# Patient Record
Sex: Male | Born: 1945 | Race: Black or African American | Hispanic: No | Marital: Single | Smoking: Never smoker
Health system: Southern US, Community
[De-identification: ages and names within clinical notes are randomized; demographics above are authoritative.]

## PROBLEM LIST (undated history)

## (undated) DIAGNOSIS — Z89512 Acquired absence of left leg below knee: Secondary | ICD-10-CM

## (undated) DIAGNOSIS — I509 Heart failure, unspecified: Secondary | ICD-10-CM

## (undated) DIAGNOSIS — I2510 Atherosclerotic heart disease of native coronary artery without angina pectoris: Secondary | ICD-10-CM

## (undated) DIAGNOSIS — N289 Disorder of kidney and ureter, unspecified: Secondary | ICD-10-CM

## (undated) DIAGNOSIS — E119 Type 2 diabetes mellitus without complications: Secondary | ICD-10-CM

## (undated) DIAGNOSIS — Z89511 Acquired absence of right leg below knee: Secondary | ICD-10-CM

## (undated) HISTORY — PX: CARDIAC CATHETERIZATION: SHX172

## (undated) HISTORY — PX: BELOW KNEE LEG AMPUTATION: SUR23

---

## 2008-05-15 HISTORY — PX: CARDIAC DEFIBRILLATOR PLACEMENT: SHX171

## 2008-10-01 ENCOUNTER — Ambulatory Visit: Payer: Self-pay | Admitting: Internal Medicine

## 2008-10-02 ENCOUNTER — Encounter: Payer: Self-pay | Admitting: Cardiology

## 2008-10-02 ENCOUNTER — Inpatient Hospital Stay (HOSPITAL_COMMUNITY): Admission: EM | Admit: 2008-10-02 | Discharge: 2008-10-05 | Payer: Self-pay | Admitting: Emergency Medicine

## 2010-08-23 LAB — GLUCOSE, CAPILLARY
Glucose-Capillary: 101 mg/dL — ABNORMAL HIGH (ref 70–99)
Glucose-Capillary: 148 mg/dL — ABNORMAL HIGH (ref 70–99)
Glucose-Capillary: 64 mg/dL — ABNORMAL LOW (ref 70–99)
Glucose-Capillary: 94 mg/dL (ref 70–99)
Glucose-Capillary: 98 mg/dL (ref 70–99)

## 2010-08-23 LAB — RENAL FUNCTION PANEL
Albumin: 2.7 g/dL — ABNORMAL LOW (ref 3.5–5.2)
GFR calc Af Amer: 9 mL/min — ABNORMAL LOW (ref >60)
GFR calc non Af Amer: 8 mL/min — ABNORMAL LOW (ref >60)
Glucose, Bld: 77 mg/dL (ref 70–99)
Phosphorus: 5.8 mg/dL — ABNORMAL HIGH (ref 2.3–4.6)
Potassium: 3.7 mEq/L (ref 3.5–5.1)
Sodium: 141 mEq/L (ref 135–145)

## 2010-08-23 LAB — BASIC METABOLIC PANEL
BUN: 16 mg/dL (ref 6–23)
BUN: 40 mg/dL — ABNORMAL HIGH (ref 6–23)
CO2: 27 mEq/L (ref 19–32)
CO2: 31 mEq/L (ref 19–32)
CO2: 33 mEq/L — ABNORMAL HIGH (ref 19–32)
Calcium: 8.6 mg/dL (ref 8.4–10.5)
Calcium: 9 mg/dL (ref 8.4–10.5)
Calcium: 9.3 mg/dL (ref 8.4–10.5)
Calcium: 9.3 mg/dL (ref 8.4–10.5)
Chloride: 97 mEq/L (ref 96–112)
Creatinine, Ser: 5.01 mg/dL — ABNORMAL HIGH (ref 0.4–1.5)
Creatinine, Ser: 7.16 mg/dL — ABNORMAL HIGH (ref 0.4–1.5)
Creatinine, Ser: 8.6 mg/dL — ABNORMAL HIGH (ref 0.4–1.5)
GFR calc Af Amer: 14 mL/min — ABNORMAL LOW (ref >60)
GFR calc Af Amer: 8 mL/min — ABNORMAL LOW (ref >60)
GFR calc Af Amer: 9 mL/min — ABNORMAL LOW (ref >60)
GFR calc non Af Amer: 12 mL/min — ABNORMAL LOW (ref >60)
GFR calc non Af Amer: 8 mL/min — ABNORMAL LOW (ref >60)
GFR calc non Af Amer: 8 mL/min — ABNORMAL LOW (ref >60)
Glucose, Bld: 101 mg/dL — ABNORMAL HIGH (ref 70–99)
Glucose, Bld: 81 mg/dL (ref 70–99)
Glucose, Bld: 90 mg/dL (ref 70–99)
Potassium: 3.3 mEq/L — ABNORMAL LOW (ref 3.5–5.1)
Sodium: 142 mEq/L (ref 135–145)

## 2010-08-23 LAB — COMPREHENSIVE METABOLIC PANEL
ALT: 20 U/L (ref 0–53)
AST: 29 U/L (ref 0–37)
CO2: 29 mEq/L (ref 19–32)
Calcium: 9.1 mg/dL (ref 8.4–10.5)
GFR calc Af Amer: 10 mL/min — ABNORMAL LOW (ref >60)
Sodium: 136 mEq/L (ref 135–145)
Total Protein: 6.7 g/dL (ref 6.0–8.3)

## 2010-08-23 LAB — DIFFERENTIAL
Eosinophils Absolute: 0.1 10*3/uL (ref 0.0–0.7)
Eosinophils Relative: 2 % (ref 0–5)
Lymphs Abs: 0.7 10*3/uL (ref 0.7–4.0)
Monocytes Absolute: 0.6 10*3/uL (ref 0.1–1.0)
Monocytes Relative: 12 % (ref 3–12)

## 2010-08-23 LAB — CBC
Hemoglobin: 9.5 g/dL — ABNORMAL LOW (ref 13.0–17.0)
MCHC: 33.2 g/dL (ref 30.0–36.0)
MCHC: 33.8 g/dL (ref 30.0–36.0)
RBC: 3.2 MIL/uL — ABNORMAL LOW (ref 4.22–5.81)
RBC: 3.27 MIL/uL — ABNORMAL LOW (ref 4.22–5.81)
RDW: 17.5 % — ABNORMAL HIGH (ref 11.5–15.5)
RDW: 17.8 % — ABNORMAL HIGH (ref 11.5–15.5)
RDW: 18 % — ABNORMAL HIGH (ref 11.5–15.5)

## 2010-08-23 LAB — CARDIAC PANEL(CRET KIN+CKTOT+MB+TROPI)
CK, MB: 5.6 ng/mL — ABNORMAL HIGH (ref 0.3–4.0)
Relative Index: 1.6 (ref 0.0–2.5)
Total CK: 365 U/L — ABNORMAL HIGH (ref 7–232)
Troponin I: 0.1 ng/mL — ABNORMAL HIGH (ref 0.00–0.06)

## 2010-08-23 LAB — PROTIME-INR
INR: 1.1 (ref 0.00–1.49)
Prothrombin Time: 14.7 seconds (ref 11.6–15.2)

## 2010-08-23 LAB — CK TOTAL AND CKMB (NOT AT ARMC)
CK, MB: 4.8 ng/mL — ABNORMAL HIGH (ref 0.3–4.0)
Relative Index: 1.6 (ref 0.0–2.5)

## 2010-08-23 LAB — POCT CARDIAC MARKERS
CKMB, poc: 5.2 ng/mL (ref 1.0–8.0)
Myoglobin, poc: >500 ng/mL (ref 12–200)

## 2010-08-23 LAB — TROPONIN I: Troponin I: 0.1 ng/mL — ABNORMAL HIGH (ref 0.00–0.06)

## 2010-09-27 NOTE — Discharge Summary (Signed)
NAME:  Clayton [name], Clayton [name] NO.:  [id number]   MEDICAL RECORD NO.:  [id number]          PATIENT TYPE:  INP   LOCATION:  6732                         FACILITY:  [facility]   PHYSICIAN:  Thompson Grayer, MD       DATE OF BIRTH:  08-18-1945   DATE OF ADMISSION:  10/01/2008  DATE OF DISCHARGE:  10/05/2008                               DISCHARGE SUMMARY   Time for this dictation and explanation to the patient greater than 40  minutes.   FINAL DIAGNOSES:  1. Admitted with syncope and ICD therapies x3 for monomorphic      ventricular tachycardia.      a.     Intercurrent antitachycardia pacing times 11, May 20 through       Oct 02, 2008.  2. Concurrent acute on chronic congestive heart failure.      a.     Symptoms of lower extremity edema and shortness of breath       with paroxysmal nocturnal dyspnea, progressive over a 2-week       period.  3. Aggressive dialysis at [place].  4. The patient has ischemic cardiomyopathy.  Echocardiogram Oct 02, 2008, ejection fraction 35%.      a.     Akinesis mid/distal posterior wall and apex.      b.     History of myocardial infarctions in the past in 1998 and       1999.  5. The patient has a Medtronic CONSULTS-CRT-D 2-4TRK.   Medication adjustments at discharge as dictated below.   SECONDARY DIAGNOSES:  1. End-stage renal disease on hemodialysis, Monday, Wednesday, Friday.  2. Hypertension.  3. Insulin-dependent diabetes.  4. Gout.  5. History of peptic ulcer disease.   PROCEDURES:  1. A 2-D echocardiogram, Oct 02, 2008, ejection fraction 35%.      Akinesis of the mid and distal posterior wall and apex.  2. Interrogation of the Medtronic BiV-ICD, no changes made this      admission.  3. Aggressive diuresis at hemodialysis.   BRIEF HISTORY:  The patient is a 65 year old male.  He has a history of  ischemic cardiomyopathy.  He has end-stage renal disease.  He is on  hemodialysis  Monday, Wednesday, Friday.  He presents at [facility] with a history of syncope followed by ICD shocks.  Rhythm strips showed several episodes of antitachycardia pacing with at  least one accelerating to ventricular fibrillation which required  shocking.  The patient has not had chest pain though his been  progressively short of breath with increasing lower extremity edema and  paroxysmal nocturnal dyspnea for the last 1-2 weeks.  The patient states  that he is at his dry weight, no hemodialysis, has been stopped early  often during to hypotension.  This would be a good time to mention that  his cardioactive medications are  1. Toprol XL 200 mg daily.  2. Hydralazine 100 mg 3 times daily.  3. Isosorbide dinitrate 20 mg 3 times daily.  4. Norvasc 10 mg daily.   HOSPITAL COURSE:  The patient admitted to [facility] May 20  with ICD therapies which were preceded by syncope.  He actually had one  in the emergency room.  This was witnessed after antitachycardia pacing  did fail.  They were delivered at 35 joules.  The patient related that  he had been getting progressively short of breath in the last 2 weeks  with lower extremity edema.  He was taken to hemodialysis the same day  of admission and aggressively dialyzed prior to hemodialysis.  All of  his medications were held except for a beta-blocker which was dosed at  50 mg daily.  He has continued to have all of the cardioactive  medications except for Toprol, placed on hold.  In the state of  congestive failure, his blood pressure on admission was 147/89, as  hemodialysis became more effective even on 50 mg only of Toprol, his  blood pressure went into the one teens and finally into the area of  106/57 and 110/63 on the day of discharge, May 24.  As mentioned above,  he has had no further ICD therapies either antitachycardia pacing or  cardioversions.  He is ready for discharge on hospital day #5.    Medications at discharge have been simplified  1. Toprol-XL 50 mg daily.  This is down from Toprol XL 200 mg daily.  2. His Hydralazine 100 mg three times daily is on hold.  3. Isosorbide dinitrate 20 mg three times daily on hold.  4. Norvasc 10 mg daily on hold.  5. The patient continues sliding scale insulin as before this      admission.  6. Enteric-coated aspirin 325 mg daily.  7. PhosLo 667 mg 2 tablets three times daily with meals.  8. Pravastatin 40 mg daily at bedtime.  9. Lantus 40 units injected subcutaneously at bedtime.  10.Zetia 10 mg daily.  11.Fish oil 1 gram three times daily with meals.  12.Renovite new medication.  This is renal vitamin daily.  This was      supplied by the renal service here.  He is asked to follow up with      Dr. Claris [facility] in the next week.   Perhaps of interest to the [facility], the patient had a  bath of 2K duration of 4 hours, standard heparin, Epogen 1000 units at  hemodialysis three times a week, Hectorol 3.5 mcg three times a week,  DFR 100, DFR 800.   Lab studies this admission on May 22, complete blood count, white cells  5.3, hemoglobin 10.6, hematocrit 31.50, platelets of 137.  Serum  electrolytes on the day of discharge, May 24, sodium 133, potassium 3.7,  chloride 95,  carbonate 27, BUN is 53, creatinine 8.6, glucose 149, protime this  admission 14.7, INR is 1.1, alkaline phosphatase 55, SGOT 29, SGPT is  20, troponin I studies are 0.09 and 0.10 than 0.10 than 0.10.  TSH was  obtained this admission, it was 0.640.  The BNP on admission was 783.      [name], [place]      Thompson Grayer, MD  Electronically Signed    GM/MEDQ  D:  10/05/2008  T:  10/06/2008  Job:  [id number]   cc:   Nickola Major. Simel, M.D.

## 2010-09-27 NOTE — H&P (Signed)
NAME:  Clayton [name], Clayton [name] NO.:  [id number]   MEDICAL RECORD NO.:  [id number]          PATIENT TYPE:  EMS   LOCATION:  MAJO                         FACILITY:  [facility]   PHYSICIAN:  Johnney Ou, MD DATE OF BIRTH:  06-27-1945   DATE OF ADMISSION:  10/01/2008  DATE OF DISCHARGE:                              HISTORY & PHYSICAL   CARDIOLOGIST:  Dr. Concha Pyo with [facility].   PRIMARY CARE DOCTOR:  Dr. Donna Christen.   CHIEF COMPLAINT:  Syncope an implantable cardioverter-defibrillator  shocks.   HISTORY OF PRESENT ILLNESS:  This is a 65 year old African American  gentleman with a history of an ischemic cardiomyopathy, unknown ejection  fraction, status post ICD placement, end-stage renal disease on  hemodialysis here with syncope today followed by ICD shocks.  He states  that he has been progressively more short of breath over the past 1-2  weeks, noticing increased lower extremity edema, paroxysmal nocturnal  dyspnea and orthopnea.  He states that they have been trying to increase  his medications, though unable to take off much fluid at hemodialysis  secondary to low blood pressure.  Today, he was feeling quite weak and  experienced syncope followed by therapy delivered by his ICD.  He called  EMS and on arrival, they noticed several episodes of ventricular  tachycardia treated antitachycardia pacing.  There was one incidence of  acceleration to ventricular fibrillation requiring shock in the  ambulance.  They administered 150 mg of IV amiodarone and brought him to  [facility] emergency department.  He has since been relatively stable  with 1-2 episodes of ventricular tachycardia successfully treated with  antitachycardia pacing.  Otherwise, he states that he is his dry weight  which is 115 kg, though he has only been able to take off a liter per  hemodialysis session.  He denies chest pain, palpitations or previous  episodes of syncope.   PAST MEDICAL HISTORY:  1. Ischemic cardiomyopathy with an unknown ejection fraction, status      post Medtronic ICD.  2. End-stage renal disease on hemodialysis Monday, Wednesday and      Friday.  3. Hypertension.  4. Insulin-dependent diabetes.  5. Gout.  6. Peptic ulcer disease, status post corrective surgery.  7. GERD.   SOCIAL HISTORY:  Lives in this [place], [place] with his wife.  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  He has no history  of tobacco abuse.   FAMILY HISTORY:  Significant for early coronary disease in his father  who died of myocardial infarction at age 93.   REVIEW OF SYSTEMS:  All 14-systems were reviewed and were negative  except as mentioned in detail in the HPI.   ALLERGIES:  ZOCOR, IT GIVES HIM MYALGIAS.   MEDICATIONS ON ADMISSION:  1. Calcium 667 mg 2 tablets three times with meals.  2. Colchicine as needed.  3. Zetia 10 mg daily.  4. Fish oil daily.  5. Toprol XL 200 mg daily.  6. Norvasc 10 mg daily.  7. Hydralazine 100 mg three times daily.  8. Isordil 20 mg three  times daily.  9. Pravastatin 40 mg daily.   PHYSICAL EXAMINATION:  VITAL SIGNS:  His blood pressure is 137/78,  respiratory rate is 22, pulses is 85.  His temperature is afebrile.  Saturating 97% on 2 liters nasal cannula.  GENERAL:  He is a 65 year old African American male appearing  chronically ill in mild acute distress.  HEENT:  Moist mucous membranes.  Pupils are equal, round and reactive to light and accommodation.  Anicteric sclera.  NECK:  15 cm jugulovenous pulsations.  No thyromegaly.  CARDIOVASCULAR:  Regular rate and rhythm.  No murmurs, rubs or gallops.  LUNGS:  Bilateral crackles throughout with decreased lung sounds at  bases.  ABDOMEN:  Nontender, nondistended.  Positive bowel sounds.  No masses.  EXTREMITIES:  He has 2+ pitting edema to his knees.  He has got a patent  left arteriovenous fistula.  NEUROLOGIC:  Alert and oriented x3.  Cranial nerves II-XII  grossly intact.  No focal neurologic deficit.  SKIN:  Warm, dry and intact.  No rashes.  PSYCH:  Mood and affect are appropriate.   RADIOLOGY:  Chest x-ray is pending.  EKG showed normal sinus rhythm with  left bundle branch and no PVCs and no outright ST or T-wave changes  suggestive of ongoing ischemia.   LABORATORY DATA:  All laboratories are currently pending.   ASSESSMENT/PLAN:  This is a 65 year old African American male with an  ischemic cardiomyopathy, status post implantable cardioverter-  defibrillator here with 2 weeks of progressive signs of acute on chronic  systolic congestive heart failure, now complicated by ventricular  tachycardia requiring implantable cardioverter-defibrillator therapy.   1. Ventricular tachycardia.  His implantable cardioverter-      defibrillator therapy appears appropriate with ATP and shocks.      This ventricular tachycardia is monomorphic and likely represents      his ischemic cardiomyopathy in context of decompensated heart      failure rather than acute ischemia.  We will increase his      metoprolol to 200 mg of metoprolol tartrate twice daily.  He      received amiodarone 150 mg in the ambulance.  We will also obtain      cardiac markers, although they are likely elevated, at least      somewhat, due to his implantable cardioverter-defibrillator      therapies.  Then, we will consider empiric therapy with      anticoagulation until further information is obtained.  2. Congestive heart failure.  This is acute on chronic systolic heart      failure by physical examination.  We will      obtain his records from his [place].  He needs diuresis.  By the      patient, he only produces minimal urine on a daily basis and we      will contact renal for hemodialysis.  Consider a heart transplant      workup in this clinical context.  3. End-stage renal disease.  We will arrange hemodialysis with a renal      consult.      Johnney Ou, MD   Electronically Signed     BHH/MEDQ  D:  10/02/2008  T:  10/02/2008  Job:  [id number]

## 2010-09-27 NOTE — Consult Note (Signed)
NAME:  Clayton [name], Clayton [name] NO.:  [id number]   MEDICAL RECORD NO.:  [id number]          PATIENT TYPE:  INP   LOCATION:  [id number]                         FACILITY:  [facility]   PHYSICIAN:  Louis Meckel, M.D.DATE OF BIRTH:  Sep 20, 1945   DATE OF CONSULTATION:  10/02/2008  DATE OF DISCHARGE:                                 CONSULTATION   CARDIOLOGIST:  Dr. Manuela Schwartz B. Jamey Reas, [place].   NEPHROLOGIST:  [facility], phone number is [phone].  Dialysis Center is in [place].   HISTORY OF PRESENT ILLNESS:  This is 65 year old African American male  who was admitted because of AICD firings x3 this evening, initially  fired after a syncopal episode around 10 p.m.  He has end-stage renal  disease, on hemodialysis Monday, Wednesday and Friday.  His last  hemodialysis was May 19, at [facility]  He was found to be dyspneic in the  ED.  X-ray showed pulmonary congestion from CHF without any pleural  effusions.  He denies any chest pain at this time.  He had seen a  Cardiologist earlier in the day in [place], where they had adjusted his  pacemaker and AICD.  During dialysis this week, he states that he has  been limited to 2-3 L removed due to low blood pressures.  He does admit  to dyspnea for a week now with cough and sputum.  No fevers, no chills  associated, no nausea and no vomiting at this time and he is anuric he  tells me.   PAST MEDICAL HISTORY:  1. End-stage renal disease, on hemodialysis Monday, Wednesday, and      Friday due to diabetes.  2. Diabetes.  3. Hypertension.  4. Hyperlipidemia.  5. Anemia.  6. GERD.  7. Coronary artery disease, status post stents in 1998 and 1999.  8. Pacemaker and AICD placed.  9. CHF.   ALLERGIES:  VALSARTAN.   MEDICATIONS:  1. Calcium acetate 667 mg 2 tablets p.o. t.i.d. with meals.  2. Acetamide 10 mg p.o. daily.  3. Metoprolol succinate 200 mg p.o. daily.  4. Insulin aspart 5 units t.i.d. before meals.  5. Lantus 40  units subcu at bedtime.  6. Pravastatin 40 mg at bedtime.   HEMODIALYSIS PRESCRIPTION:  Again, he gets hemodialysis on Monday,  Wednesday, and Friday.  His estimated dry weight is 115 kg.  His length  of treatment is 4 hours.  He has access in the left AV fistula at the  wrist.   PHYSICAL EXAMINATION:  VITAL SIGNS:  Temperature is 98.1, pulse 87,  blood pressure 137/70, respiratory rate 21, currently 3 L he is sating  99%.  GENERAL:  He is alert and oriented.  He is in no apparent distress on 3  L.  HEENT:  Extraocular movements intact.  Pupils equal, round, reactive to  light and accommodation.  Moist mucous membranes.  NECK:  Supple with full range of motion.  CARDIOVASCULAR:  Irregular rate and rhythm.  He has 1/6 systolic  ejection murmur.  No rubs heard.  LUNGS:  He has crackles in the bases bilaterally.  ABDOMEN:  Soft,  nontender and nondistended.  EXTREMITIES:  Trace edema of the lower extremities.  He has a left AV  fistula at the wrist with significant palpable bruit.  NEUROLOGIC:  Alert and oriented x3.  Cranial nerves II through XII  grossly intact.  Normal strength.  Normal sensation.  No asterixis.   LABORATORY DATA:  Chest x-ray showed CHF with cardiomegaly, no  significant pleural effusions.  White blood cell count 4.7, hemoglobin  10.6, and platelets 141.  Sodium 136, potassium 3.1, chloride 98, bicarb  29, BUN 30, creatinine 6.96 and glucose 156.  Phos 4.4, mag 2.3, total  protein 6.7, albumin 3.1, and calcium 9.1.  Point-of-care enzymes have  been negative.  BNP is 783.   ASSESSMENT AND PLAN:  This is 65 year old male status post automatic  implantable cardioverter-defibrillator firings x3 with end-stage renal  disease, on hemodialysis currently on 3 L of oxygen for dyspnea.  1. Renal.  Again, he has end-stage renal disease, on hemodialysis on      Monday, Wednesday, and Friday.  He is not at hemodialysis emergency      at this time.  He is comfortable.   Electrolytes are stable and he      is not acidotic.  We will plan for a hemodialysis in the morning      after calling [facility] to obtain records.  It is estimated      that he may need hemodialysis today and tomorrow to pull enough      fluid off given his low-blood pressures.  2. Cardiovascular.  He is status post AICD firings, I will leave this      per Cardiology.  It is unclear the reason for these firings at this      time.  3. Bones.  We will continue him on calcium acetate.  4. Anemia.  He is not at goal.  He is less than 12.  We will find out      if he has received any epo, or I will provide this with      hemodialysis.  5. Diabetes.  Sliding scale insulin and Lantus.  6. Hypertension.  Hold his BP medications prior to hemodialysis today.  7. Fluids, electrolytes, nutrition.  Put him on a renal diet 70/2/2.  8. Disposition.  He is a full code.  Hemodialysis in the morning and      he is to obtain outside records.      Dion Body, MD  Electronically Signed     ______________________________  Louis Meckel, M.D.    KL/MEDQ  D:  10/02/2008  T:  10/02/2008  Job:  [id number]

## 2012-10-09 ENCOUNTER — Other Ambulatory Visit: Payer: Self-pay | Admitting: Family Medicine

## 2012-10-09 LAB — CBC WITH DIFFERENTIAL/PLATELET
Basophil #: 0.1 10*3/uL (ref 0.0–0.1)
Basophil %: 1.2 %
Eosinophil #: 0.1 10*3/uL (ref 0.0–0.7)
Eosinophil %: 0.7 %
HCT: 27 % — ABNORMAL LOW (ref 40.0–52.0)
HGB: 8.8 g/dL — ABNORMAL LOW (ref 13.0–18.0)
MCH: 31.8 pg (ref 26.0–34.0)
Monocyte #: 0.7 x10 3/mm (ref 0.2–1.0)
Monocyte %: 8 %
Neutrophil %: 83.5 %
RBC: 2.78 10*6/uL — ABNORMAL LOW (ref 4.40–5.90)
WBC: 9.1 10*3/uL (ref 3.8–10.6)

## 2012-10-09 LAB — COMPREHENSIVE METABOLIC PANEL
Albumin: 2 g/dL — ABNORMAL LOW (ref 3.4–5.0)
Bilirubin,Total: 1.6 mg/dL — ABNORMAL HIGH (ref 0.2–1.0)
Co2: 29 mmol/L (ref 21–32)
Creatinine: 4.4 mg/dL — ABNORMAL HIGH (ref 0.60–1.30)
EGFR (African American): 15 — ABNORMAL LOW
EGFR (Non-African Amer.): 13 — ABNORMAL LOW
Glucose: 84 mg/dL (ref 65–99)
Osmolality: 281 (ref 275–301)
Potassium: 4.6 mmol/L (ref 3.5–5.1)
SGOT(AST): 40 U/L — ABNORMAL HIGH (ref 15–37)
SGPT (ALT): 23 U/L (ref 12–78)
Total Protein: 6.7 g/dL (ref 6.4–8.2)

## 2012-11-26 ENCOUNTER — Emergency Department: Payer: Self-pay | Admitting: Emergency Medicine

## 2012-11-26 LAB — CBC
HGB: 9.4 g/dL — ABNORMAL LOW (ref 13.0–18.0)
MCHC: 32.3 g/dL (ref 32.0–36.0)
Platelet: 164 10*3/uL (ref 150–440)
RBC: 3 10*6/uL — ABNORMAL LOW (ref 4.40–5.90)
WBC: 5.6 10*3/uL (ref 3.8–10.6)

## 2012-11-26 LAB — COMPREHENSIVE METABOLIC PANEL
Albumin: 2.2 g/dL — ABNORMAL LOW (ref 3.4–5.0)
Alkaline Phosphatase: 228 U/L — ABNORMAL HIGH (ref 50–136)
Bilirubin,Total: 0.9 mg/dL (ref 0.2–1.0)
Calcium, Total: 9.4 mg/dL (ref 8.5–10.1)
Chloride: 100 mmol/L (ref 98–107)
EGFR (African American): 11 — ABNORMAL LOW
EGFR (Non-African Amer.): 9 — ABNORMAL LOW
Glucose: 90 mg/dL (ref 65–99)
Osmolality: 282 (ref 275–301)
Potassium: 3.5 mmol/L (ref 3.5–5.1)
SGOT(AST): 15 U/L (ref 15–37)
SGPT (ALT): 11 U/L — ABNORMAL LOW (ref 12–78)
Sodium: 137 mmol/L (ref 136–145)
Total Protein: 7.5 g/dL (ref 6.4–8.2)

## 2012-12-01 LAB — CULTURE, BLOOD (SINGLE)

## 2014-06-28 ENCOUNTER — Inpatient Hospital Stay (HOSPITAL_COMMUNITY)
Admission: EM | Admit: 2014-06-28 | Discharge: 2014-07-11 | DRG: 871 | Disposition: A | Discharge status: Other Acute Inpatient Hospital | Payer: Medicare Other | Attending: Internal Medicine | Admitting: Internal Medicine

## 2014-06-28 ENCOUNTER — Emergency Department (HOSPITAL_COMMUNITY): Payer: Medicare Other

## 2014-06-28 ENCOUNTER — Encounter (HOSPITAL_COMMUNITY): Payer: Self-pay | Admitting: Emergency Medicine

## 2014-06-28 DIAGNOSIS — I120 Hypertensive chronic kidney disease with stage 5 chronic kidney disease or end stage renal disease: Secondary | ICD-10-CM | POA: Diagnosis present

## 2014-06-28 DIAGNOSIS — E861 Hypovolemia: Secondary | ICD-10-CM | POA: Diagnosis present

## 2014-06-28 DIAGNOSIS — Z89512 Acquired absence of left leg below knee: Secondary | ICD-10-CM

## 2014-06-28 DIAGNOSIS — I214 Non-ST elevation (NSTEMI) myocardial infarction: Secondary | ICD-10-CM | POA: Diagnosis present

## 2014-06-28 DIAGNOSIS — E1165 Type 2 diabetes mellitus with hyperglycemia: Secondary | ICD-10-CM | POA: Diagnosis present

## 2014-06-28 DIAGNOSIS — Z515 Encounter for palliative care: Secondary | ICD-10-CM | POA: Insufficient documentation

## 2014-06-28 DIAGNOSIS — I272 Other secondary pulmonary hypertension: Secondary | ICD-10-CM | POA: Diagnosis present

## 2014-06-28 DIAGNOSIS — R55 Syncope and collapse: Secondary | ICD-10-CM

## 2014-06-28 DIAGNOSIS — G9340 Encephalopathy, unspecified: Secondary | ICD-10-CM | POA: Diagnosis present

## 2014-06-28 DIAGNOSIS — R140 Abdominal distension (gaseous): Secondary | ICD-10-CM | POA: Insufficient documentation

## 2014-06-28 DIAGNOSIS — I5023 Acute on chronic systolic (congestive) heart failure: Secondary | ICD-10-CM | POA: Diagnosis not present

## 2014-06-28 DIAGNOSIS — A047 Enterocolitis due to Clostridium difficile: Secondary | ICD-10-CM | POA: Diagnosis present

## 2014-06-28 DIAGNOSIS — D72819 Decreased white blood cell count, unspecified: Secondary | ICD-10-CM | POA: Diagnosis present

## 2014-06-28 DIAGNOSIS — E1159 Type 2 diabetes mellitus with other circulatory complications: Secondary | ICD-10-CM | POA: Diagnosis present

## 2014-06-28 DIAGNOSIS — Z79899 Other long term (current) drug therapy: Secondary | ICD-10-CM | POA: Diagnosis not present

## 2014-06-28 DIAGNOSIS — I739 Peripheral vascular disease, unspecified: Secondary | ICD-10-CM | POA: Diagnosis present

## 2014-06-28 DIAGNOSIS — D689 Coagulation defect, unspecified: Secondary | ICD-10-CM | POA: Diagnosis present

## 2014-06-28 DIAGNOSIS — I255 Ischemic cardiomyopathy: Secondary | ICD-10-CM | POA: Diagnosis present

## 2014-06-28 DIAGNOSIS — E1351 Other specified diabetes mellitus with diabetic peripheral angiopathy without gangrene: Secondary | ICD-10-CM | POA: Diagnosis present

## 2014-06-28 DIAGNOSIS — R570 Cardiogenic shock: Secondary | ICD-10-CM | POA: Diagnosis present

## 2014-06-28 DIAGNOSIS — I472 Ventricular tachycardia: Secondary | ICD-10-CM | POA: Insufficient documentation

## 2014-06-28 DIAGNOSIS — E1151 Type 2 diabetes mellitus with diabetic peripheral angiopathy without gangrene: Secondary | ICD-10-CM | POA: Diagnosis present

## 2014-06-28 DIAGNOSIS — E876 Hypokalemia: Secondary | ICD-10-CM | POA: Diagnosis present

## 2014-06-28 DIAGNOSIS — I501 Left ventricular failure: Secondary | ICD-10-CM

## 2014-06-28 DIAGNOSIS — I5033 Acute on chronic diastolic (congestive) heart failure: Secondary | ICD-10-CM | POA: Insufficient documentation

## 2014-06-28 DIAGNOSIS — E1129 Type 2 diabetes mellitus with other diabetic kidney complication: Secondary | ICD-10-CM

## 2014-06-28 DIAGNOSIS — R6521 Severe sepsis with septic shock: Secondary | ICD-10-CM | POA: Diagnosis present

## 2014-06-28 DIAGNOSIS — I2510 Atherosclerotic heart disease of native coronary artery without angina pectoris: Secondary | ICD-10-CM | POA: Diagnosis present

## 2014-06-28 DIAGNOSIS — N186 End stage renal disease: Secondary | ICD-10-CM | POA: Diagnosis present

## 2014-06-28 DIAGNOSIS — Z6825 Body mass index (BMI) 25.0-25.9, adult: Secondary | ICD-10-CM | POA: Diagnosis not present

## 2014-06-28 DIAGNOSIS — E1122 Type 2 diabetes mellitus with diabetic chronic kidney disease: Secondary | ICD-10-CM | POA: Diagnosis present

## 2014-06-28 DIAGNOSIS — Z89511 Acquired absence of right leg below knee: Secondary | ICD-10-CM

## 2014-06-28 DIAGNOSIS — IMO0002 Reserved for concepts with insufficient information to code with codable children: Secondary | ICD-10-CM | POA: Diagnosis present

## 2014-06-28 DIAGNOSIS — M898X9 Other specified disorders of bone, unspecified site: Secondary | ICD-10-CM | POA: Diagnosis present

## 2014-06-28 DIAGNOSIS — I5022 Chronic systolic (congestive) heart failure: Secondary | ICD-10-CM | POA: Diagnosis present

## 2014-06-28 DIAGNOSIS — A419 Sepsis, unspecified organism: Principal | ICD-10-CM | POA: Diagnosis present

## 2014-06-28 DIAGNOSIS — E119 Type 2 diabetes mellitus without complications: Secondary | ICD-10-CM | POA: Insufficient documentation

## 2014-06-28 DIAGNOSIS — I959 Hypotension, unspecified: Secondary | ICD-10-CM

## 2014-06-28 DIAGNOSIS — E440 Moderate protein-calorie malnutrition: Secondary | ICD-10-CM | POA: Diagnosis present

## 2014-06-28 DIAGNOSIS — D631 Anemia in chronic kidney disease: Secondary | ICD-10-CM | POA: Diagnosis present

## 2014-06-28 DIAGNOSIS — A0472 Enterocolitis due to Clostridium difficile, not specified as recurrent: Secondary | ICD-10-CM | POA: Insufficient documentation

## 2014-06-28 DIAGNOSIS — I4729 Other ventricular tachycardia: Secondary | ICD-10-CM | POA: Insufficient documentation

## 2014-06-28 DIAGNOSIS — Z95810 Presence of automatic (implantable) cardiac defibrillator: Secondary | ICD-10-CM

## 2014-06-28 DIAGNOSIS — Z452 Encounter for adjustment and management of vascular access device: Secondary | ICD-10-CM

## 2014-06-28 DIAGNOSIS — Z992 Dependence on renal dialysis: Secondary | ICD-10-CM

## 2014-06-28 HISTORY — DX: Heart failure, unspecified: I50.9

## 2014-06-28 HISTORY — DX: Acquired absence of left leg below knee: Z89.512

## 2014-06-28 HISTORY — DX: Disorder of kidney and ureter, unspecified: N28.9

## 2014-06-28 HISTORY — DX: Acquired absence of left leg below knee: Z89.511

## 2014-06-28 HISTORY — DX: Atherosclerotic heart disease of native coronary artery without angina pectoris: I25.10

## 2014-06-28 HISTORY — DX: Type 2 diabetes mellitus without complications: E11.9

## 2014-06-28 LAB — I-STAT CG4 LACTIC ACID, ED
Lactic Acid, Venous: 3.82 mmol/L (ref 0.5–2.0)
Lactic Acid, Venous: 4.61 mmol/L (ref 0.5–2.0)

## 2014-06-28 LAB — COMPREHENSIVE METABOLIC PANEL
ALT: 10 U/L (ref 0–53)
ANION GAP: 10 (ref 5–15)
AST: 31 U/L (ref 0–37)
Albumin: 2.9 g/dL — ABNORMAL LOW (ref 3.5–5.2)
Alkaline Phosphatase: 123 U/L — ABNORMAL HIGH (ref 39–117)
BUN: 18 mg/dL (ref 6–23)
CO2: 31 mmol/L (ref 19–32)
Calcium: 9.2 mg/dL (ref 8.4–10.5)
Chloride: 96 mmol/L (ref 96–112)
Creatinine, Ser: 6.17 mg/dL — ABNORMAL HIGH (ref 0.50–1.35)
GFR calc non Af Amer: 8 mL/min — ABNORMAL LOW (ref >90)
GFR, EST AFRICAN AMERICAN: 10 mL/min — AB (ref >90)
Glucose, Bld: 96 mg/dL (ref 70–99)
POTASSIUM: 2.6 mmol/L — AB (ref 3.5–5.1)
Sodium: 137 mmol/L (ref 135–145)
TOTAL PROTEIN: 7.5 g/dL (ref 6.0–8.3)
Total Bilirubin: 1.5 mg/dL — ABNORMAL HIGH (ref 0.3–1.2)

## 2014-06-28 LAB — CBC WITH DIFFERENTIAL/PLATELET
Basophils Absolute: 0 10*3/uL (ref 0.0–0.1)
Basophils Relative: 1 % (ref 0–1)
EOS PCT: 2 % (ref 0–5)
Eosinophils Absolute: 0.1 10*3/uL (ref 0.0–0.7)
HCT: 40.1 % (ref 39.0–52.0)
Hemoglobin: 12.7 g/dL — ABNORMAL LOW (ref 13.0–17.0)
LYMPHS ABS: 0.6 10*3/uL — AB (ref 0.7–4.0)
Lymphocytes Relative: 20 % (ref 12–46)
MCH: 28.4 pg (ref 26.0–34.0)
MCHC: 31.7 g/dL (ref 30.0–36.0)
MCV: 89.7 fL (ref 78.0–100.0)
MONOS PCT: 8 % (ref 3–12)
Monocytes Absolute: 0.3 10*3/uL (ref 0.1–1.0)
Neutro Abs: 2.2 10*3/uL (ref 1.7–7.7)
Neutrophils Relative %: 68 % (ref 43–77)
Platelets: 79 10*3/uL — ABNORMAL LOW (ref 150–400)
RBC: 4.47 MIL/uL (ref 4.22–5.81)
RDW: 21.1 % — AB (ref 11.5–15.5)
WBC: 3.2 10*3/uL — ABNORMAL LOW (ref 4.0–10.5)

## 2014-06-28 LAB — I-STAT CHEM 8, ED
BUN: 21 mg/dL (ref 6–23)
CALCIUM ION: 1.09 mmol/L — AB (ref 1.13–1.30)
Chloride: 96 mmol/L (ref 96–112)
Creatinine, Ser: 5.5 mg/dL — ABNORMAL HIGH (ref 0.50–1.35)
GLUCOSE: 93 mg/dL (ref 70–99)
HCT: 46 % (ref 39.0–52.0)
Hemoglobin: 15.6 g/dL (ref 13.0–17.0)
POTASSIUM: 2.7 mmol/L — AB (ref 3.5–5.1)
SODIUM: 140 mmol/L (ref 135–145)
TCO2: 25 mmol/L (ref 0–100)

## 2014-06-28 LAB — I-STAT TROPONIN, ED: TROPONIN I, POC: 0.14 ng/mL — AB (ref 0.00–0.08)

## 2014-06-28 LAB — LIPASE, BLOOD: Lipase: 25 U/L (ref 11–59)

## 2014-06-28 MED ORDER — HEPARIN SODIUM (PORCINE) 5000 UNIT/ML IJ SOLN
5000.0000 [IU] | Freq: Three times a day (TID) | INTRAMUSCULAR | Status: DC
  Administered 2014-06-29: 5000 [IU] via SUBCUTANEOUS
  Filled 2014-06-28 (×4): qty 1

## 2014-06-28 MED ORDER — SODIUM CHLORIDE 0.9 % IV BOLUS (SEPSIS)
500.0000 mL | Freq: Once | INTRAVENOUS | Status: AC
  Administered 2014-06-28: 500 mL via INTRAVENOUS

## 2014-06-28 MED ORDER — POTASSIUM CHLORIDE CRYS ER 20 MEQ PO TBCR: 30.0000 meq | EXTENDED_RELEASE_TABLET | Freq: Once | ORAL | Status: DC

## 2014-06-28 MED ORDER — POTASSIUM CHLORIDE 10 MEQ/100ML IV SOLN
10.0000 meq | Freq: Once | INTRAVENOUS | Status: AC
  Administered 2014-06-28: 10 meq via INTRAVENOUS
  Filled 2014-06-28: qty 100

## 2014-06-28 MED ORDER — INSULIN ASPART 100 UNIT/ML ~~LOC~~ SOLN: 0.0000 [IU] | SUBCUTANEOUS | Status: DC

## 2014-06-28 MED ORDER — FAMOTIDINE IN NACL 20-0.9 MG/50ML-% IV SOLN
20.0000 mg | Freq: Two times a day (BID) | INTRAVENOUS | Status: DC
  Filled 2014-06-28: qty 50

## 2014-06-28 MED ORDER — SODIUM CHLORIDE 0.9 % IV SOLN
INTRAVENOUS | Status: DC
  Administered 2014-06-29: 02:00:00 via INTRAVENOUS

## 2014-06-28 MED ORDER — POTASSIUM CHLORIDE 10 MEQ/100ML IV SOLN
10.0000 meq | INTRAVENOUS | Status: AC
  Administered 2014-06-28 – 2014-06-29 (×2): 10 meq via INTRAVENOUS
  Filled 2014-06-28 (×2): qty 100

## 2014-06-28 MED ORDER — ONDANSETRON HCL 4 MG/2ML IJ SOLN
4.0000 mg | Freq: Once | INTRAMUSCULAR | Status: AC
  Administered 2014-06-28: 4 mg via INTRAVENOUS
  Filled 2014-06-28: qty 2

## 2014-06-28 MED ORDER — SODIUM CHLORIDE 0.9 % IV SOLN
250.0000 mL | INTRAVENOUS | Status: DC | PRN
  Administered 2014-06-29: 250 mL via INTRAVENOUS

## 2014-06-28 MED ORDER — ONDANSETRON HCL 4 MG/2ML IJ SOLN: 4.0000 mg | Freq: Four times a day (QID) | INTRAMUSCULAR | Status: DC | PRN

## 2014-06-28 NOTE — ED Notes (Signed)
Pt c/o passing out twice at home today. Pt states he has been having diarrhea x 3 weeks. Pt denies any pain and is a/o x 4 on arrival to ed. Neuro intact on arrival to ED. EMS gave 1 amp of oral glucose d/t BG- 66 on scene. Pt states his BG is always in the 60s.

## 2014-06-28 NOTE — ED Provider Notes (Signed)
CSN: [id number]     Arrival date & time 06/28/14  2027 History   First MD Initiated Contact with Patient 06/28/14 2032     Chief Complaint  Patient presents with  . Loss of Consciousness     HPI Patient presents with 2 episodes of syncope.  Patient has had no problems with syncope before.  Patient has had several week history of diarrhea.  No vomiting.  Appetite is been normal.  Patient has multiple medical issues including diabetes, chronic renal failure.  He has a pacemaker defibrillator in place has coronary artery disease. Past Medical History  Diagnosis Date  . Renal disorder   . CHF (congestive heart failure)   . Diabetes mellitus without complication   . Coronary artery disease   . S/P bilateral BKA (below knee amputation)    Past Surgical History  Procedure Laterality Date  . Below knee leg amputation Bilateral   . Cardiac catheterization  1998/1999    stents both times, no other details available  . Cardiac defibrillator placement  2010    [employer]   History reviewed. No pertinent family history. History  Substance Use Topics  . Smoking status: Never Smoker   . Smokeless tobacco: Not on file  . Alcohol Use: No    Review of Systems  All other systems reviewed and are negative  Allergies  Simvastatin  Home Medications   Prior to Admission medications   Medication Sig Start Date End Date Taking? Authorizing Provider  cinacalcet (SENSIPAR) 30 MG tablet Take 30 mg by mouth daily. 04/22/14 04/22/15 Yes Historical Provider, MD  mirtazapine (REMERON) 30 MG tablet Take 30 mg by mouth at bedtime. 05/23/13  Yes Historical Provider, MD  Omega-3 Fatty Acids (OMEGA-3 FISH OIL PO) Take 1 capsule by mouth daily.   Yes Historical Provider, MD  sertraline (ZOLOFT) 25 MG tablet Take 25 mg by mouth daily. 06/03/14 06/03/15 Yes Historical Provider, MD  sevelamer (RENAGEL) 800 MG tablet Take 1,600 mg by mouth 3 (three) times daily. 05/06/14 05/06/15 Yes Historical Provider, MD   BP  65/31 mmHg  Pulse 77  Temp(Src) 98.1 F (36.7 C) (Oral)  Resp 22  Ht 6\' 5"  (1.956 m)  Wt 174 lb 13.2 oz (79.3 kg)  BMI 20.73 kg/m2  SpO2 98% Physical Exam  Constitutional: He is oriented to person, place, and time. He appears well-developed and well-nourished. No distress.  HENT:  Head: Normocephalic.    Eyes: Pupils are equal, round, and reactive to light.  Neck: Normal range of motion.  Cardiovascular: Normal rate and intact distal pulses.   Pulmonary/Chest: No respiratory distress. He has no wheezes. He has no rales.  Subcutaneous pacemaker/defibrillator  Abdominal: Soft. Normal appearance. He exhibits no distension. There is no tenderness. There is no rebound.  Musculoskeletal:  Bilateral below-knee amputations  Neurological: He is alert and oriented to person, place, and time. No cranial nerve deficit.  Skin: Skin is warm and dry. No rash noted.  Psychiatric: He has a normal mood and affect. His behavior is normal.  Nursing note and vitals reviewed.   ED Course  Procedures (including critical care time) Labs Review Labs Reviewed  CLOSTRIDIUM DIFFICILE BY PCR - Abnormal; Notable for the following:    C difficile by pcr POSITIVE (*)    All other components within normal limits  COMPREHENSIVE METABOLIC PANEL - Abnormal; Notable for the following:    Potassium 2.6 (*)    Creatinine, Ser 6.17 (*)    Albumin 2.9 (*)  Alkaline Phosphatase 123 (*)    Total Bilirubin 1.5 (*)    GFR calc non Af Amer 8 (*)    GFR calc Af Amer 10 (*)    All other components within normal limits  CBC WITH DIFFERENTIAL/PLATELET - Abnormal; Notable for the following:    WBC 3.2 (*)    Hemoglobin 12.7 (*)    RDW 21.1 (*)    Platelets 79 (*)    Lymphs Abs 0.6 (*)    All other components within normal limits  CBC - Abnormal; Notable for the following:    Hemoglobin 12.4 (*)    HCT 38.7 (*)    RDW 21.0 (*)    Platelets 73 (*)    All other components within normal limits  BASIC METABOLIC  PANEL - Abnormal; Notable for the following:    Potassium 3.1 (*)    Glucose, Bld 102 (*)    Creatinine, Ser 6.02 (*)    GFR calc non Af Amer 9 (*)    GFR calc Af Amer 10 (*)    All other components within normal limits  PHOSPHORUS - Abnormal; Notable for the following:    Phosphorus 5.3 (*)    All other components within normal limits  PHOSPHORUS - Abnormal; Notable for the following:    Phosphorus 5.1 (*)    All other components within normal limits  TROPONIN I - Abnormal; Notable for the following:    Troponin I 0.47 (*)    All other components within normal limits  TROPONIN I - Abnormal; Notable for the following:    Troponin I 1.16 (*)    All other components within normal limits  TROPONIN I - Abnormal; Notable for the following:    Troponin I 3.16 (*)    All other components within normal limits  PROTIME-INR - Abnormal; Notable for the following:    Prothrombin Time 26.2 (*)    INR 2.38 (*)    All other components within normal limits  GLUCOSE, CAPILLARY - Abnormal; Notable for the following:    Glucose-Capillary 68 (*)    All other components within normal limits  BASIC METABOLIC PANEL - Abnormal; Notable for the following:    Potassium 3.4 (*)    Creatinine, Ser 6.42 (*)    GFR calc non Af Amer 8 (*)    GFR calc Af Amer 9 (*)    All other components within normal limits  LACTIC ACID, PLASMA - Abnormal; Notable for the following:    Lactic Acid, Venous 2.7 (*)    All other components within normal limits  TROPONIN I - Abnormal; Notable for the following:    Troponin I 4.13 (*)    All other components within normal limits  PROTIME-INR - Abnormal; Notable for the following:    Prothrombin Time 27.0 (*)    INR 2.47 (*)    All other components within normal limits  RENAL FUNCTION PANEL - Abnormal; Notable for the following:    Potassium 3.2 (*)    BUN 26 (*)    Creatinine, Ser 6.87 (*)    Phosphorus 5.6 (*)    Albumin 2.5 (*)    GFR calc non Af Amer 7 (*)    GFR  calc Af Amer 9 (*)    Anion gap 20 (*)    All other components within normal limits  LACTIC ACID, PLASMA - Abnormal; Notable for the following:    Lactic Acid, Venous 3.9 (*)    All other components within normal  limits  CBC WITH DIFFERENTIAL/PLATELET - Abnormal; Notable for the following:    Hemoglobin 12.4 (*)    RDW 21.5 (*)    Platelets 75 (*)    Monocytes Relative 13 (*)    All other components within normal limits  HEPATIC FUNCTION PANEL - Abnormal; Notable for the following:    Albumin 2.5 (*)    Total Bilirubin 1.8 (*)    Bilirubin, Direct 0.8 (*)    Indirect Bilirubin 1.0 (*)    All other components within normal limits  RENAL FUNCTION PANEL - Abnormal; Notable for the following:    BUN 25 (*)    Creatinine, Ser 6.72 (*)    Phosphorus 5.6 (*)    Albumin 2.5 (*)    GFR calc non Af Amer 8 (*)    GFR calc Af Amer 9 (*)    Anion gap 16 (*)    All other components within normal limits  CARBOXYHEMOGLOBIN - Abnormal; Notable for the following:    Total hemoglobin 12.4 (*)    All other components within normal limits  CBC - Abnormal; Notable for the following:    RBC 4.18 (*)    Hemoglobin 11.7 (*)    HCT 36.2 (*)    RDW 21.5 (*)    Platelets 78 (*)    All other components within normal limits  TROPONIN I - Abnormal; Notable for the following:    Troponin I 2.35 (*)    All other components within normal limits  PROTIME-INR - Abnormal; Notable for the following:    Prothrombin Time 25.1 (*)    INR 2.26 (*)    All other components within normal limits  APTT - Abnormal; Notable for the following:    aPTT 52 (*)    All other components within normal limits  GLUCOSE, CAPILLARY - Abnormal; Notable for the following:    Glucose-Capillary 62 (*)    All other components within normal limits  GLUCOSE, CAPILLARY - Abnormal; Notable for the following:    Glucose-Capillary 116 (*)    All other components within normal limits  RENAL FUNCTION PANEL - Abnormal; Notable for the  following:    Sodium 133 (*)    Glucose, Bld 124 (*)    Creatinine, Ser 4.93 (*)    Calcium 8.3 (*)    Albumin 2.5 (*)    GFR calc non Af Amer 11 (*)    GFR calc Af Amer 13 (*)    All other components within normal limits  RENAL FUNCTION PANEL - Abnormal; Notable for the following:    Sodium 133 (*)    Potassium 3.3 (*)    Glucose, Bld 152 (*)    Creatinine, Ser 4.22 (*)    Calcium 8.0 (*)    Albumin 2.4 (*)    GFR calc non Af Amer 13 (*)    GFR calc Af Amer 15 (*)    All other components within normal limits  GLUCOSE, CAPILLARY - Abnormal; Notable for the following:    Glucose-Capillary 109 (*)    All other components within normal limits  GLUCOSE, CAPILLARY - Abnormal; Notable for the following:    Glucose-Capillary 106 (*)    All other components within normal limits  GLUCOSE, CAPILLARY - Abnormal; Notable for the following:    Glucose-Capillary 66 (*)    All other components within normal limits  RENAL FUNCTION PANEL - Abnormal; Notable for the following:    Potassium 3.4 (*)    Glucose, Bld 168 (*)  Creatinine, Ser 3.42 (*)    Calcium 8.0 (*)    Phosphorus 1.7 (*)    Albumin 2.4 (*)    GFR calc non Af Amer 17 (*)    GFR calc Af Amer 20 (*)    All other components within normal limits  APTT - Abnormal; Notable for the following:    aPTT 55 (*)    All other components within normal limits  GLUCOSE, CAPILLARY - Abnormal; Notable for the following:    Glucose-Capillary 105 (*)    All other components within normal limits  RENAL FUNCTION PANEL - Abnormal; Notable for the following:    Glucose, Bld 127 (*)    Creatinine, Ser 3.41 (*)    Calcium 7.9 (*)    Phosphorus 2.2 (*)    Albumin 2.5 (*)    GFR calc non Af Amer 17 (*)    GFR calc Af Amer 20 (*)    All other components within normal limits  GLUCOSE, CAPILLARY - Abnormal; Notable for the following:    Glucose-Capillary 107 (*)    All other components within normal limits  RENAL FUNCTION PANEL - Abnormal;  Notable for the following:    Sodium 134 (*)    Glucose, Bld 108 (*)    Creatinine, Ser 2.66 (*)    Calcium 7.9 (*)    Phosphorus 1.6 (*)    Albumin 2.6 (*)    GFR calc non Af Amer 23 (*)    GFR calc Af Amer 27 (*)    All other components within normal limits  APTT - Abnormal; Notable for the following:    aPTT 54 (*)    All other components within normal limits  GLUCOSE, CAPILLARY - Abnormal; Notable for the following:    Glucose-Capillary 158 (*)    All other components within normal limits  RENAL FUNCTION PANEL - Abnormal; Notable for the following:    Sodium 134 (*)    Glucose, Bld 122 (*)    Creatinine, Ser 2.40 (*)    Calcium 7.6 (*)    Albumin 2.5 (*)    GFR calc non Af Amer 26 (*)    GFR calc Af Amer 31 (*)    All other components within normal limits  CBC - Abnormal; Notable for the following:    WBC 3.9 (*)    RBC 3.94 (*)    Hemoglobin 11.0 (*)    HCT 33.9 (*)    RDW 21.8 (*)    Platelets 48 (*)    All other components within normal limits  GLUCOSE, CAPILLARY - Abnormal; Notable for the following:    Glucose-Capillary 191 (*)    All other components within normal limits  RENAL FUNCTION PANEL - Abnormal; Notable for the following:    Potassium 3.4 (*)    Glucose, Bld 126 (*)    Creatinine, Ser 2.29 (*)    Calcium 7.5 (*)    Phosphorus 2.1 (*)    Albumin 2.3 (*)    GFR calc non Af Amer 28 (*)    GFR calc Af Amer 32 (*)    All other components within normal limits  APTT - Abnormal; Notable for the following:    aPTT 61 (*)    All other components within normal limits  GLUCOSE, CAPILLARY - Abnormal; Notable for the following:    Glucose-Capillary 108 (*)    All other components within normal limits  RENAL FUNCTION PANEL - Abnormal; Notable for the following:    Glucose, Bld  106 (*)    Creatinine, Ser 2.38 (*)    Calcium 7.7 (*)    Phosphorus 2.2 (*)    Albumin 2.3 (*)    GFR calc non Af Amer 27 (*)    GFR calc Af Amer 31 (*)    All other components  within normal limits  GLUCOSE, CAPILLARY - Abnormal; Notable for the following:    Glucose-Capillary 121 (*)    All other components within normal limits  GLUCOSE, CAPILLARY - Abnormal; Notable for the following:    Glucose-Capillary 122 (*)    All other components within normal limits  RENAL FUNCTION PANEL - Abnormal; Notable for the following:    Glucose, Bld 135 (*)    Creatinine, Ser 2.25 (*)    Calcium 7.9 (*)    Albumin 2.5 (*)    GFR calc non Af Amer 28 (*)    GFR calc Af Amer 33 (*)    All other components within normal limits  APTT - Abnormal; Notable for the following:    aPTT 49 (*)    All other components within normal limits  CBC - Abnormal; Notable for the following:    RBC 4.02 (*)    Hemoglobin 11.1 (*)    HCT 34.0 (*)    RDW 22.2 (*)    Platelets 57 (*)    All other components within normal limits  GLUCOSE, CAPILLARY - Abnormal; Notable for the following:    Glucose-Capillary 117 (*)    All other components within normal limits  GLUCOSE, CAPILLARY - Abnormal; Notable for the following:    Glucose-Capillary 108 (*)    All other components within normal limits  GLUCOSE, CAPILLARY - Abnormal; Notable for the following:    Glucose-Capillary 119 (*)    All other components within normal limits  GLUCOSE, CAPILLARY - Abnormal; Notable for the following:    Glucose-Capillary 135 (*)    All other components within normal limits  GLUCOSE, CAPILLARY - Abnormal; Notable for the following:    Glucose-Capillary 127 (*)    All other components within normal limits  I-STAT CHEM 8, ED - Abnormal; Notable for the following:    Potassium 2.7 (*)    Creatinine, Ser 5.50 (*)    Calcium, Ion 1.09 (*)    All other components within normal limits  I-STAT TROPOININ, ED - Abnormal; Notable for the following:    Troponin i, poc 0.14 (*)    All other components within normal limits  I-STAT CG4 LACTIC ACID, ED - Abnormal; Notable for the following:    Lactic Acid, Venous 3.82 (*)     All other components within normal limits  I-STAT CG4 LACTIC ACID, ED - Abnormal; Notable for the following:    Lactic Acid, Venous 4.61 (*)    All other components within normal limits  POCT I-STAT 3, ART BLOOD GAS (G3+) - Abnormal; Notable for the following:    Bicarbonate 24.1 (*)    All other components within normal limits  MRSA PCR SCREENING  CULTURE, BLOOD (ROUTINE X 2)  CULTURE, BLOOD (ROUTINE X 2)  LIPASE, BLOOD  MAGNESIUM  MAGNESIUM  AMYLASE  LACTIC ACID, PLASMA  GLUCOSE, CAPILLARY  GLUCOSE, CAPILLARY  GLUCOSE, CAPILLARY  GLUCOSE, CAPILLARY  GLUCOSE, CAPILLARY  GLUCOSE, CAPILLARY  GLUCOSE, CAPILLARY  GLUCOSE, CAPILLARY  GLUCOSE, CAPILLARY  GLUCOSE, CAPILLARY  LACTIC ACID, PLASMA  MAGNESIUM  GLUCOSE, CAPILLARY  GLUCOSE, CAPILLARY  GLUCOSE, CAPILLARY  MAGNESIUM  GLUCOSE, CAPILLARY  MAGNESIUM  GLUCOSE, CAPILLARY  GLUCOSE, CAPILLARY  GLUCOSE, CAPILLARY  MAGNESIUM  CORTISOL  GLUCOSE, CAPILLARY  MAGNESIUM  RENAL FUNCTION PANEL    Imaging Review No results found.   EKG Interpretation   Date/Time:  Sunday June 28 2014 20:48:40 EST Ventricular Rate:  62 PR Interval:    QRS Duration: 203 QT Interval:  538 QTC Calculation: 546 R Axis:   132 Text Interpretation:  Atrial fibrillation Paired ventricular premature  complexes Nonspecific intraventricular conduction delay Repol abnrm  suggests ischemia, diffuse leads Confirmed by Nicholle Falzon  MD, Rafaela Dinius ([id number])  on 06/28/2014 9:10:53 PM      CRITICAL CARE Performed by: Leonard Schwartz L Total critical care time: 30 min Critical care time was exclusive of separately billable procedures and treating other patients. Critical care was necessary to treat or prevent imminent or life-threatening deterioration. Critical care was time spent personally by me on the following activities: development of treatment plan with patient and/or surrogate as well as nursing, discussions with consultants, evaluation of  patient's response to treatment, examination of patient, obtaining history from patient or surrogate, ordering and performing treatments and interventions, ordering and review of laboratory studies, ordering and review of radiographic studies, pulse oximetry and re-evaluation of patient's condition.  Discussed with nephrology recommended IV potassium and possible IV pressors.  Recommended admission to ICU.  Discussed with critical care who will come and evaluate. MDM   Final diagnoses:  Syncope        Dot Lanes, MD 07/05/14 [phone]

## 2014-06-28 NOTE — ED Notes (Signed)
Dr Audie Pinto given copies of troponin .14 and lactic acid results 3.82

## 2014-06-28 NOTE — H&P (Signed)
PULMONARY / CRITICAL CARE MEDICINE   Name: Clayton [name] MRN: [id number] DOB: 07-18-1945    ADMISSION DATE:  06/28/2014  REFERRING MD :  EDP   CHIEF COMPLAINT:  Hypotension   INITIAL PRESENTATION: 69 yo male with hx ESRD, CHF, DM, CAD, bilat BKA who receives all his care at [place].  He presented 2/14 after a fall at home with syncope.  He has had diarrhea x 2-3 weeks with negative GI w/u thus far at [facility] (was scheduled for upper/lower scope next week).  In ER was significantly hypotensive with SBP 60's, hypokalemic with K 2.4.  No sig fluid resuscitation given r/t ESRD and CHF.  PCCM called to admit.   STUDIES:  CT head 2/14>>> neg acute  2D echo 2/14>>>  SIGNIFICANT EVENTS:   HISTORY OF PRESENT ILLNESS:  69 yo male with hx ESRD, CHF, DM, CAD, bilat BKA who receives all his care at [place].  He presented 2/14 after a fall at home with syncope.  He has had diarrhea x 2-3 weeks with negative GI w/u thus far at [facility] (was scheduled for upper/lower scope next week).  In ER was significantly hypotensive with SBP 60's, hypokalemic with K 2.4.  No sig fluid resuscitation given r/t ESRD and CHF.  PCCM called to admit.   Per pt and family, he has had significant diarrhea x 3 weeks, usually 6-7 times per day, watery.  Denies melena, abd pain, emesis, hematemesis. Was sitting on toilet and "passed out".  Was awake but sluggish once family member found him.  Denies fever, SOB, cough, purulent sputum.    PAST MEDICAL HISTORY :   has a past medical history of Renal disorder; CHF (congestive heart failure); Diabetes mellitus without complication; Coronary artery disease; and S/P bilateral BKA (below knee amputation).  has past surgical history that includes bilat BKA and stents. Prior to Admission medications   Medication Sig Start Date End Date Taking? Authorizing Provider  cinacalcet (SENSIPAR) 30 MG tablet Take 30 mg by mouth daily. 04/22/14 04/22/15 Yes Historical Provider, MD  mirtazapine (REMERON) 30 MG tablet  Take 30 mg by mouth at bedtime. 05/23/13  Yes Historical Provider, MD  Omega-3 Fatty Acids (OMEGA-3 FISH OIL PO) Take 1 capsule by mouth daily.   Yes Historical Provider, MD  sertraline (ZOLOFT) 25 MG tablet Take 25 mg by mouth daily. 06/03/14 06/03/15 Yes Historical Provider, MD  sevelamer (RENAGEL) 800 MG tablet Take 1,600 mg by mouth 3 (three) times daily. 05/06/14 05/06/15 Yes Historical Provider, MD   Allergies  Allergen Reactions  . Simvastatin Other (See Comments)    unknown    FAMILY HISTORY:  has no family status information on file.  SOCIAL HISTORY:  reports that he has never smoked. He does not have any smokeless tobacco history on file. He reports that he does not drink alcohol or use illicit drugs.  REVIEW OF SYSTEMS:  As per HPI - All other systems reviewed and were neg.    SUBJECTIVE:   VITAL SIGNS: Temp:  [98 F (36.7 C)] 98 F (36.7 C) (02/14 2041) Pulse Rate:  [37-73] 37 (02/14 2315) Resp:  [10-24] 16 (02/14 2345) BP: (55-88)/(44-59) 88/59 mmHg (02/14 2345) SpO2:  [87 %-99 %] 97 % (02/14 2315) Weight:  [159 lb (72.122 kg)] 159 lb (72.122 kg) (02/14 2041) HEMODYNAMICS:   VENTILATOR SETTINGS:   INTAKE / OUTPUT: No intake or output data in the 24 hours ending 06/29/14 0000  PHYSICAL EXAMINATION: General:  Chronically ill appearing male, appears unwell  Neuro:  Awake, alert, slow to respond at times but appropriate, gen weakness, MAE  HEENT:  Mm dry, pale, no JVD  Cardiovascular:  s1s2 irreg  Lungs:  resps even non labored on [place], diminished bases  Abdomen:  Soft, +bs, non tender  Musculoskeletal:  Cool, clammy, bilat BKA    LABS:  CBC  Recent Labs Lab 06/28/14 2053 06/28/14 2058  WBC 3.2*  --   HGB 12.7* 15.6  HCT 40.1 46.0  PLT 79*  --    Coag's No results for input(s): APTT, INR in the last 168 hours. BMET  Recent Labs Lab 06/28/14 2053 06/28/14 2058  NA 137 140  K 2.6* 2.7*  CL 96 96  CO2 31  --   BUN 18 21  CREATININE 6.17* 5.50*   GLUCOSE 96 93   Electrolytes  Recent Labs Lab 06/28/14 2053  CALCIUM 9.2   Sepsis Markers  Recent Labs Lab 06/28/14 2059 06/28/14 2345  LATICACIDVEN 3.82* 4.61*   ABG No results for input(s): PHART, PCO2ART, PO2ART in the last 168 hours. Liver Enzymes  Recent Labs Lab 06/28/14 2053  AST 31  ALT 10  ALKPHOS 123*  BILITOT 1.5*  ALBUMIN 2.9*   Cardiac Enzymes No results for input(s): TROPONINI, PROBNP in the last 168 hours. Glucose No results for input(s): GLUCAP in the last 168 hours.  Imaging Dg Chest 2 View  06/28/2014   CLINICAL DATA:  Syncope  EXAM: CHEST  2 VIEW  COMPARISON:  03/25/2010  FINDINGS: There are intact appearances of the transvenous leads. There is unchanged moderate cardiomegaly. There is mild interstitial thickening and central vascular congestion which is new from 2011. This likely represents a degree of congestive heart failure. No confluent alveolar opacities are evident. No effusions are evident.  IMPRESSION: Probable mild congestive heart failure.   Electronically Signed   By: Andreas Newport M.D.   On: 06/28/2014 22:01   Ct Head Wo Contrast  06/28/2014   CLINICAL DATA:  Two episodes of syncope today  EXAM: CT HEAD WITHOUT CONTRAST  TECHNIQUE: Contiguous axial images were obtained from the base of the skull through the vertex without intravenous contrast.  COMPARISON:  None.  FINDINGS: The ventricles are normal in size and configuration. There is no intracranial mass, hemorrhage, extra-axial fluid collection, or midline shift. There is slight small vessel disease in the centra semiovale bilaterally. Elsewhere, gray-white compartments appear normal. No acute infarct apparent. The bony calvarium appears intact. The mastoid air cells are clear. There is a small air-fluid level in the right maxillary antrum with a a retention cyst along the anterior inferior right maxillary antrum. There is rightward deviation of the nasal septum.  IMPRESSION: Mild  periventricular small vessel disease. No intracranial mass, hemorrhage, or acute appearing infarct. Evidence of right maxillary sinus disease.   Electronically Signed   By: Lowella Grip III M.D.   On: 06/28/2014 21:30     ASSESSMENT / PLAN:  PULMONARY Concern for pulm edema P:   Supplemental O2 as needed  Monitor resp status with vomiting  F/u CXR   CARDIOVASCULAR CVL  2/14>>> CAD  Hx sCHF  DM Hypotension - hypovolemia in setting diarrhea  HTN  Pacemaker/ AICD  P:  Check echo Trend troponin  12 leak EKG  Limited fluid resuscitation given HD status F/u lactate   CVL for CVP and pressors   RENAL ESRD - MWF HD - did get full HD Friday without difficulty  Hypokalemia  P:   Replete K  Renal to see   GASTROINTESTINAL Diarrhea - ongoing x 3weeks.  Denies abd pain, nausea  P:   Check stool  pepcid  zofran PRN  Consider GI input - hold off for now until hemodynamically stable   HEMATOLOGIC Thrombocytopenia  Leukopenia  P:  F/u CBC  SQ heparin   INFECTIOUS ?enteritis  P:   Check stool cultures, o/p  Pct  Monitor off abx   ENDOCRINE DM  P:   SSI  NEUROLOGIC Syncope - suspect r/t significant hypotension/ volume depletion.  CT head negative P:   Supportive care    FAMILY  - Updates: updated at length in ER 2/14  - Inter-disciplinary family meet or Palliative Care meeting due by:  2/21   Nickolas Madrid, NP 06/29/2014  12:00 AM Pager: [phone] or [phone]  Attending Note:  I have examined patient, reviewed labs, studies and notes. I have discussed the case with Shon Millet, and I agree with the data and plans as amended above. Pt had syncope, presents with hypovolemic +/- septic shock. No clear source of sepsis. He is weak but interacting. He is receiving IVF, will start empiric abx if he spikes temp or decompensates in any way. Independent critical care time is 60 minutes.   Baltazar Apo, MD, PhD 06/29/2014, 2:20 AM [facility] [phone] or if no answer [phone]

## 2014-06-28 NOTE — ED Notes (Signed)
Critical K+ 2.6 called by lab. EDP notified.

## 2014-06-28 NOTE — ED Notes (Signed)
EDP notified pt had unresponsive episode lasting 15-20 seconds and of low BP of 58/47.

## 2014-06-29 ENCOUNTER — Inpatient Hospital Stay (HOSPITAL_COMMUNITY): Payer: Medicare Other

## 2014-06-29 DIAGNOSIS — A419 Sepsis, unspecified organism: Secondary | ICD-10-CM | POA: Diagnosis present

## 2014-06-29 DIAGNOSIS — N186 End stage renal disease: Secondary | ICD-10-CM | POA: Diagnosis not present

## 2014-06-29 DIAGNOSIS — E1151 Type 2 diabetes mellitus with diabetic peripheral angiopathy without gangrene: Secondary | ICD-10-CM | POA: Diagnosis present

## 2014-06-29 DIAGNOSIS — IMO0002 Reserved for concepts with insufficient information to code with codable children: Secondary | ICD-10-CM | POA: Diagnosis present

## 2014-06-29 DIAGNOSIS — Z992 Dependence on renal dialysis: Secondary | ICD-10-CM

## 2014-06-29 DIAGNOSIS — G9340 Encephalopathy, unspecified: Secondary | ICD-10-CM | POA: Diagnosis not present

## 2014-06-29 DIAGNOSIS — Z89511 Acquired absence of right leg below knee: Secondary | ICD-10-CM

## 2014-06-29 DIAGNOSIS — E1351 Other specified diabetes mellitus with diabetic peripheral angiopathy without gangrene: Secondary | ICD-10-CM | POA: Diagnosis present

## 2014-06-29 DIAGNOSIS — R579 Shock, unspecified: Secondary | ICD-10-CM

## 2014-06-29 DIAGNOSIS — R6521 Severe sepsis with septic shock: Secondary | ICD-10-CM

## 2014-06-29 DIAGNOSIS — I5022 Chronic systolic (congestive) heart failure: Secondary | ICD-10-CM | POA: Diagnosis present

## 2014-06-29 DIAGNOSIS — Z89512 Acquired absence of left leg below knee: Secondary | ICD-10-CM

## 2014-06-29 DIAGNOSIS — E1129 Type 2 diabetes mellitus with other diabetic kidney complication: Secondary | ICD-10-CM | POA: Diagnosis present

## 2014-06-29 DIAGNOSIS — I214 Non-ST elevation (NSTEMI) myocardial infarction: Secondary | ICD-10-CM | POA: Diagnosis not present

## 2014-06-29 DIAGNOSIS — E1165 Type 2 diabetes mellitus with hyperglycemia: Secondary | ICD-10-CM

## 2014-06-29 DIAGNOSIS — I959 Hypotension, unspecified: Secondary | ICD-10-CM | POA: Diagnosis not present

## 2014-06-29 DIAGNOSIS — I2510 Atherosclerotic heart disease of native coronary artery without angina pectoris: Secondary | ICD-10-CM | POA: Diagnosis present

## 2014-06-29 DIAGNOSIS — I509 Heart failure, unspecified: Secondary | ICD-10-CM

## 2014-06-29 LAB — BASIC METABOLIC PANEL
ANION GAP: 14 (ref 5–15)
Anion gap: 8 (ref 5–15)
BUN: 20 mg/dL (ref 6–23)
BUN: 21 mg/dL (ref 6–23)
CHLORIDE: 100 mmol/L (ref 96–112)
CHLORIDE: 100 mmol/L (ref 96–112)
CO2: 31 mmol/L (ref 19–32)
CO2: 25 mmol/L (ref 19–32)
CREATININE: 6.02 mg/dL — AB (ref 0.50–1.35)
CREATININE: 6.42 mg/dL — AB (ref 0.50–1.35)
Calcium: 8.7 mg/dL (ref 8.4–10.5)
Calcium: 8.9 mg/dL (ref 8.4–10.5)
GFR calc non Af Amer: 9 mL/min — ABNORMAL LOW (ref >90)
GFR calc non Af Amer: 8 mL/min — ABNORMAL LOW (ref >90)
GFR, EST AFRICAN AMERICAN: 10 mL/min — AB (ref >90)
GFR, EST AFRICAN AMERICAN: 9 mL/min — AB (ref >90)
GLUCOSE: 102 mg/dL — AB (ref 70–99)
GLUCOSE: 93 mg/dL (ref 70–99)
Potassium: 3.1 mmol/L — ABNORMAL LOW (ref 3.5–5.1)
Potassium: 3.4 mmol/L — ABNORMAL LOW (ref 3.5–5.1)
SODIUM: 139 mmol/L (ref 135–145)
Sodium: 139 mmol/L (ref 135–145)

## 2014-06-29 LAB — GLUCOSE, CAPILLARY
GLUCOSE-CAPILLARY: 72 mg/dL (ref 70–99)
GLUCOSE-CAPILLARY: 91 mg/dL (ref 70–99)
GLUCOSE-CAPILLARY: 73 mg/dL (ref 70–99)
GLUCOSE-CAPILLARY: 68 mg/dL — AB (ref 70–99)
GLUCOSE-CAPILLARY: 80 mg/dL (ref 70–99)
Glucose-Capillary: 98 mg/dL (ref 70–99)
Glucose-Capillary: 77 mg/dL (ref 70–99)

## 2014-06-29 LAB — CBC
HEMATOCRIT: 38.7 % — AB (ref 39.0–52.0)
HEMOGLOBIN: 12.4 g/dL — AB (ref 13.0–17.0)
MCH: 28.7 pg (ref 26.0–34.0)
MCHC: 32 g/dL (ref 30.0–36.0)
MCV: 89.6 fL (ref 78.0–100.0)
PLATELETS: 73 10*3/uL — AB (ref 150–400)
RBC: 4.32 MIL/uL (ref 4.22–5.81)
RDW: 21 % — ABNORMAL HIGH (ref 11.5–15.5)
WBC: 5.2 10*3/uL (ref 4.0–10.5)

## 2014-06-29 LAB — PROTIME-INR
INR: 2.38 — ABNORMAL HIGH (ref 0.00–1.49)
PROTHROMBIN TIME: 26.2 s — AB (ref 11.6–15.2)

## 2014-06-29 LAB — LACTIC ACID, PLASMA
LACTIC ACID, VENOUS: 1.3 mmol/L (ref 0.5–2.0)
LACTIC ACID, VENOUS: 2.7 mmol/L — AB (ref 0.5–2.0)

## 2014-06-29 LAB — MRSA PCR SCREENING: MRSA BY PCR: NEGATIVE

## 2014-06-29 LAB — AMYLASE: AMYLASE: 47 U/L (ref 0–105)

## 2014-06-29 LAB — TROPONIN I
TROPONIN I: 0.47 ng/mL — AB (ref <0.031)
Troponin I: 1.16 ng/mL (ref <0.031)
Troponin I: 3.16 ng/mL (ref <0.031)

## 2014-06-29 LAB — MAGNESIUM
Magnesium: 1.9 mg/dL (ref 1.5–2.5)
Magnesium: 2 mg/dL (ref 1.5–2.5)

## 2014-06-29 LAB — PHOSPHORUS
PHOSPHORUS: 5.1 mg/dL — AB (ref 2.3–4.6)
Phosphorus: 5.3 mg/dL — ABNORMAL HIGH (ref 2.3–4.6)

## 2014-06-29 MED ORDER — DOPAMINE-DEXTROSE 3.2-5 MG/ML-% IV SOLN
0.0000 ug/kg/min | INTRAVENOUS | Status: DC
  Administered 2014-06-29: 3 ug/kg/min via INTRAVENOUS

## 2014-06-29 MED ORDER — SODIUM CHLORIDE 0.9 % IV BOLUS (SEPSIS)
500.0000 mL | Freq: Once | INTRAVENOUS | Status: AC
  Administered 2014-06-29: 500 mL via INTRAVENOUS

## 2014-06-29 MED ORDER — CINACALCET HCL 30 MG PO TABS
30.0000 mg | ORAL_TABLET | Freq: Every day | ORAL | Status: DC
  Administered 2014-06-30 – 2014-07-03 (×4): 30 mg via ORAL
  Filled 2014-06-29 (×6): qty 1

## 2014-06-29 MED ORDER — VANCOMYCIN HCL IN DEXTROSE 1-5 GM/200ML-% IV SOLN: 1000.0000 mg | INTRAVENOUS | Status: DC

## 2014-06-29 MED ORDER — CETYLPYRIDINIUM CHLORIDE 0.05 % MT LIQD
7.0000 mL | Freq: Two times a day (BID) | OROMUCOSAL | Status: DC
  Administered 2014-06-29 – 2014-07-10 (×20): 7 mL via OROMUCOSAL

## 2014-06-29 MED ORDER — FAMOTIDINE IN NACL 20-0.9 MG/50ML-% IV SOLN
20.0000 mg | Freq: Two times a day (BID) | INTRAVENOUS | Status: DC
  Administered 2014-06-29: 20 mg via INTRAVENOUS
  Filled 2014-06-29 (×2): qty 50

## 2014-06-29 MED ORDER — FAMOTIDINE IN NACL 20-0.9 MG/50ML-% IV SOLN
20.0000 mg | INTRAVENOUS | Status: DC
  Administered 2014-06-30 – 2014-07-09 (×10): 20 mg via INTRAVENOUS
  Filled 2014-06-29 (×11): qty 50

## 2014-06-29 MED ORDER — DOPAMINE-DEXTROSE 3.2-5 MG/ML-% IV SOLN
INTRAVENOUS | Status: AC
  Filled 2014-06-29: qty 250

## 2014-06-29 MED ORDER — SODIUM CHLORIDE 0.9 % IV SOLN
1750.0000 mg | Freq: Once | INTRAVENOUS | Status: AC
  Administered 2014-06-29: 1750 mg via INTRAVENOUS
  Filled 2014-06-29: qty 1750

## 2014-06-29 MED ORDER — POTASSIUM CHLORIDE 10 MEQ/50ML IV SOLN
10.0000 meq | Freq: Once | INTRAVENOUS | Status: AC
  Administered 2014-06-29: 10 meq via INTRAVENOUS
  Filled 2014-06-29: qty 50

## 2014-06-29 MED ORDER — PHENYLEPHRINE HCL 10 MG/ML IJ SOLN
30.0000 ug/min | INTRAVENOUS | Status: DC
  Administered 2014-06-29: 100 ug/min via INTRAVENOUS
  Administered 2014-06-29: 30 ug/min via INTRAVENOUS
  Administered 2014-06-30: 135 ug/min via INTRAVENOUS
  Filled 2014-06-29 (×7): qty 4

## 2014-06-29 MED ORDER — DOXERCALCIFEROL 4 MCG/2ML IV SOLN
2.0000 ug | INTRAVENOUS | Status: DC
  Filled 2014-06-29 (×2): qty 2

## 2014-06-29 MED ORDER — PIPERACILLIN-TAZOBACTAM IN DEX 2-0.25 GM/50ML IV SOLN
2.2500 g | Freq: Three times a day (TID) | INTRAVENOUS | Status: DC
  Administered 2014-06-29 – 2014-06-30 (×3): 2.25 g via INTRAVENOUS
  Filled 2014-06-29 (×4): qty 50

## 2014-06-29 MED ORDER — DEXTROSE 50 % IV SOLN
INTRAVENOUS | Status: AC
Start: 2014-06-29 — End: 2014-06-29
  Administered 2014-06-29: 50 mL
  Filled 2014-06-29: qty 50

## 2014-06-29 MED ORDER — DEXTROSE-NACL 5-0.9 % IV SOLN
INTRAVENOUS | Status: DC
  Administered 2014-06-29 – 2014-07-07 (×3): via INTRAVENOUS

## 2014-06-29 NOTE — Progress Notes (Signed)
Lactic Acid level results 2.7, Dr. Tia Masker made aware.

## 2014-06-29 NOTE — Progress Notes (Signed)
ANTIBIOTIC CONSULT NOTE - INITIAL  Pharmacy Consult for Vancomycin and Zosyn Indication: rule out sepsis  Allergies  Allergen Reactions  . Simvastatin Other (See Comments)    unknown    Patient Measurements: Height: 6\' 5"  (195.6 cm) Weight: 179 lb 14.3 oz (81.6 kg) IBW/kg (Calculated) : 89.1  Vital Signs: Temp: 98.9 F (37.2 C) (02/15 1602) Temp Source: Oral (02/15 1602) BP: 80/48 mmHg (02/15 1745) Pulse Rate: 63 (02/15 1745) Intake/Output from previous day: 02/14 0701 - 02/15 0700 In: 495.5 [I.V.:295.5; IV Piggyback:200] Out: -  Intake/Output from this shift: Total I/O In: 1352.9 [I.V.:852.9; IV Piggyback:500] Out: -   Labs:  Recent Labs  06/28/14 2053 06/28/14 2058 06/29/14 0405 06/29/14 1100  WBC 3.2*  --  5.2  --   HGB 12.7* 15.6 12.4*  --   PLT 79*  --  73*  --   CREATININE 6.17* 5.50* 6.02* 6.42*   Estimated Creatinine Clearance: 12.9 mL/min (by C-G formula based on Cr of 6.42). No results for input(s): VANCOTROUGH, VANCOPEAK, VANCORANDOM, GENTTROUGH, GENTPEAK, GENTRANDOM, TOBRATROUGH, TOBRAPEAK, TOBRARND, AMIKACINPEAK, AMIKACINTROU, AMIKACIN in the last 72 hours.   Microbiology: Recent Results (from the past 720 hour(s))  MRSA PCR Screening     Status: None   Collection Time: 06/29/14  1:02 AM  Result Value Ref Range Status   MRSA by PCR NEGATIVE NEGATIVE Final    Comment:        The GeneXpert MRSA Assay (FDA approved for NASAL specimens only), is one component of a comprehensive MRSA colonization surveillance program. It is not intended to diagnose MRSA infection nor to guide or monitor treatment for MRSA infections.     Medical History: Past Medical History  Diagnosis Date  . Renal disorder   . CHF (congestive heart failure)   . Diabetes mellitus without complication   . Coronary artery disease   . S/P bilateral BKA (below knee amputation)     Medications:  Prescriptions prior to admission  Medication Sig Dispense Refill Last Dose   . cinacalcet (SENSIPAR) 30 MG tablet Take 30 mg by mouth daily.   06/28/2014 at Unknown time  . mirtazapine (REMERON) 30 MG tablet Take 30 mg by mouth at bedtime.   06/27/2014 at Unknown time  . Omega-3 Fatty Acids (OMEGA-3 FISH OIL PO) Take 1 capsule by mouth daily.   06/28/2014 at Unknown time  . sertraline (ZOLOFT) 25 MG tablet Take 25 mg by mouth daily.   06/28/2014 at Unknown time  . sevelamer (RENAGEL) 800 MG tablet Take 1,600 mg by mouth 3 (three) times daily.   06/28/2014 at Unknown time   Scheduled:  . antiseptic oral rinse  7 mL Mouth Rinse BID  . [START ON 06/30/2014] cinacalcet  30 mg Oral Q breakfast  . [START ON 07/01/2014] doxercalciferol  2 mcg Intravenous Q M,W,F-HD  . [START ON 06/30/2014] famotidine (PEPCID) IV  20 mg Intravenous Q24H  . insulin aspart  0-9 Units Subcutaneous 6 times per day   Infusions:  . sodium chloride 50 mL/hr at 06/29/14 0158  . dextrose 5 % and 0.9% NaCl 50 mL/hr at 06/29/14 1637  . phenylephrine (NEO-SYNEPHRINE) Adult infusion 135 mcg/min (06/29/14 1749)   Assessment: 69yo male with history of ESRD on HD MWF here for hypotension and recent fall at home with syncopal episode. Pharmacy is consulted to dose vancomycin and zosyn for rule out sepsis. Pt is afebrile, WBC 5.2, LA 2.7, sCr 6.42.   Pt did not receive HD today per nephrology note and  will be re-evaluated tomorrow for HD need.  Goal of Therapy:  Vancomycin target pre-HD level 15-25 mcg/ml  Plan:  Vancomycin 1750mg  IV load followed by 1g IV qHD Zosyn 2.25g IV q8h Measure antibiotic drug levels at steady state Follow up culture results, HD schedule, and clinical course  Andrey Cota. Diona Foley, PharmD Clinical Pharmacist Pager [phone] 06/29/2014,5:58 PM

## 2014-06-29 NOTE — Progress Notes (Signed)
PULMONARY / CRITICAL CARE MEDICINE   Name: Clayton [name] MRN: [id number] DOB: [birth year]    ADMISSION DATE:  06/28/2014  REFERRING MD :  EDP   CHIEF COMPLAINT:  Hypotension   INITIAL PRESENTATION: 69 yo male with hx ESRD, CHF, DM, CAD, bilat BKA who receives all his care at [place].  He presented 2/14 after a fall at home with syncope.  He has had diarrhea x 2-3 weeks with negative GI w/u thus far at [facility] (was scheduled for upper/lower scope next week).  In ER was significantly hypotensive with SBP 60's, hypokalemic with K 2.4.  No sig fluid resuscitation given r/t ESRD and CHF.  PCCM called to admit.   STUDIES:  CT head 2/14>>> neg acute  2D echo 2/14>>>  SIGNIFICANT EVENTS: 2/14 >> Hypotension s/p 1L fluids - Neo &Dopa started  SUBJECTIVE: Feeling better today. Denies diarrhea currently. Denies CP, SOB.   VITAL SIGNS: Temp:  [97.5 F (36.4 C)-98.2 F (36.8 C)] 97.8 F (36.6 C) (02/15 0853) Pulse Rate:  [25-101] 59 (02/15 1145) Resp:  [10-30] 11 (02/15 1145) BP: (55-106)/(42-70) 92/55 mmHg (02/15 1145) SpO2:  [81 %-100 %] 97 % (02/15 1145) Weight:  [159 lb (72.122 kg)-179 lb 14.3 oz (81.6 kg)] 179 lb 14.3 oz (81.6 kg) (02/15 0715) HEMODYNAMICS: CVP:  [21 mmHg-24 mmHg] 24 mmHg VENTILATOR SETTINGS:   INTAKE / OUTPUT:  Intake/Output Summary (Last 24 hours) at 06/29/14 1200 Last data filed at 06/29/14 1100  Gross per 24 hour  Intake 845.46 ml  Output      0 ml  Net 845.46 ml    PHYSICAL EXAMINATION: General:  Chronically ill appearing male, NAD Neuro:  A&O; Normal mentation  HEENT:  Mm dry; no JVD  Cardiovascular:  s1s2 irreg  Lungs:  resps even non labored  Abdomen:  Soft, +bs, non tender  Musculoskeletal:  bilat BKA   LABS:  CBC  Recent Labs Lab 06/28/14 2053 06/28/14 2058 06/29/14 0405  WBC 3.2*  --  5.2  HGB 12.7* 15.6 12.4*  HCT 40.1 46.0 38.7*  PLT 79*  --  73*   Coag's  Recent Labs Lab 06/29/14 0609  INR 2.38*   BMET  Recent Labs Lab  06/28/14 2053 06/28/14 2058 06/29/14 0405  NA 137 140 139  K 2.6* 2.7* 3.1*  CL 96 96 100  CO2 31  --  31  BUN 18 21 20   CREATININE 6.17* 5.50* 6.02*  GLUCOSE 96 93 102*   Electrolytes  Recent Labs Lab 06/28/14 2053 06/28/14 2350 06/29/14 0405  CALCIUM 9.2  --  8.7  MG  --  2.0 1.9  PHOS  --  5.1* 5.3*   Sepsis Markers  Recent Labs Lab 06/28/14 2059 06/28/14 2345 06/29/14 0405  LATICACIDVEN 3.82* 4.61* 1.3   ABG No results for input(s): PHART, PCO2ART, PO2ART in the last 168 hours. Liver Enzymes  Recent Labs Lab 06/28/14 2053  AST 31  ALT 10  ALKPHOS 123*  BILITOT 1.5*  ALBUMIN 2.9*   Cardiac Enzymes  Recent Labs Lab 06/28/14 2350 06/29/14 0405  TROPONINI 0.47* 1.16*   Glucose  Recent Labs Lab 06/29/14 0206 06/29/14 0407  GLUCAP 72 91    Imaging Dg Chest 2 View  06/28/2014   CLINICAL DATA:  Syncope  EXAM: CHEST  2 VIEW  COMPARISON:  03/25/2010  FINDINGS: There are intact appearances of the transvenous leads. There is unchanged moderate cardiomegaly. There is mild interstitial thickening and central vascular congestion which is new from 2011.  This likely represents a degree of congestive heart failure. No confluent alveolar opacities are evident. No effusions are evident.  IMPRESSION: Probable mild congestive heart failure.   Electronically Signed   By: Andreas Newport M.D.   On: 06/28/2014 22:01   Ct Head Wo Contrast  06/28/2014   CLINICAL DATA:  Two episodes of syncope today  EXAM: CT HEAD WITHOUT CONTRAST  TECHNIQUE: Contiguous axial images were obtained from the base of the skull through the vertex without intravenous contrast.  COMPARISON:  None.  FINDINGS: The ventricles are normal in size and configuration. There is no intracranial mass, hemorrhage, extra-axial fluid collection, or midline shift. There is slight small vessel disease in the centra semiovale bilaterally. Elsewhere, gray-white compartments appear normal. No acute infarct  apparent. The bony calvarium appears intact. The mastoid air cells are clear. There is a small air-fluid level in the right maxillary antrum with a a retention cyst along the anterior inferior right maxillary antrum. There is rightward deviation of the nasal septum.  IMPRESSION: Mild periventricular small vessel disease. No intracranial mass, hemorrhage, or acute appearing infarct. Evidence of right maxillary sinus disease.   Electronically Signed   By: Lowella Grip III M.D.   On: 06/28/2014 21:30    ASSESSMENT / PLAN:  PULMONARY Pulmonary HTN CXR w/o acute process P:   Supplemental O2 as needed   CARDIOVASCULAR CVL  2/14>>> CAD  Hx sCHF  DM Hypotension likely 2/2 hypovolemia in setting diarrhea - Improving Low BP at baseline HTN  Pacemaker/ AICD  P:  Check echo Troponin: Trending up likely demand ischemia - Recheck tomorrow EKG: No acute changes Limited fluid resuscitation given HD status; s/p 1 L - IVF @ 50  F/u lactate = returned to normal s/p IVF and pressors CVP: elevated  RENAL ESRD - MWF HD - did get full HD Friday without difficulty  Hypokalemia  P:   Replete K  Renal following  GASTROINTESTINAL Diarrhea - ongoing x 3weeks.  Denies abd pain, nausea  P:   Check stool  pepcid  zofran PRN  Consider GI input - hold off for now until hemodynamically stable   HEMATOLOGIC Thrombocytopenia  Leukopenia - Resolved  P:  F/u CBC  No current DVT prophylaxis given plts low and bilateral BKA's and INR current 2.4  INFECTIOUS ?enteritis  P:   Check stool cultures, o/p  Pct  Monitor off abx   ENDOCRINE DM  P:   SSI  NEUROLOGIC Syncope - suspect r/t significant hypotension/ volume depletion.  CT head negative P:   Supportive care    FAMILY  - Updates: updated at length in ER 2/14  - Inter-disciplinary family meet or Palliative Care meeting due by:  2/21  Olam Idler, MD 06/29/2014, 12:00 PM PGY-2, [facility]  ATTENDING NOTE: I  have personally reviewed patient's available data, including medical history, events of note, physical examination and test results as part of my evaluation. I have discussed with resident/NP and other careteam providers such as pharmacist, RN and RRT & co-ordinated with consultants. In addition, I personally evaluated patient and elicited key history of ESRD, syncope & shock requiring pressors , diarrhea x 3 weeks, exam findings of clear lungs, afebrile, thrill of LUE AVF & labs showing no leucocytosis, elevated trops.   Not clear hypovolemic vs septic sahock CVP high Gentle hydration, ct neo gtt Echo to r/o effusion Rest per NP/medical resident whose note is outlined above and that I agree with and edited in full.  Care during the described time interval was provided by me and/or other providers on the critical care team.  I have reviewed this patient's available data, including medical history, events of note, physical examination and test results as part of my evaluation  CC time x 47m  Rigoberto Noel. MD

## 2014-06-29 NOTE — Progress Notes (Signed)
CRITICAL VALUE ALERT  Critical value received:  Trop I = 1.16  Date of notification:  06/29/14  Time of notification:  [id number]  Critical value read back:Yes.    Nurse who received alert:  Delphia Grates  MD notified (1st page):  Dr Chase Caller  Time of first page:  0601  MD notified (2nd page):  Time of second page:  Responding MD:  Dr Chase Caller  Time MD responded:  [phone]

## 2014-06-29 NOTE — Consult Note (Signed)
Reason for Consult: To manage dialysis and dialysis related needs Referring Physician: Byrum CCM  Clayton [name] is an 69 y.o. male with PMhx significant for DM, CAD, PVD s/p bilat BKA's.  Also with ESRD - gets HD at [facility] MWF via an AVF.  Patient had been in his USOH except for diarrhea that he has had for 3 weeks- had an appt to get it evaluated next week.  He had his usual HD on Friday without incident- his EDW is 81 kg.  He presented to the ER yesterday with complaints of syncope times 2- BP was found to be very low- weight was 72 kg.  Now in ICU, on pressors- elytes show hypokalemia and elevated lactate, both better with medical management.  His troponin has also bumped.  He is soft spoken but says that he feels better.  Denies pain or SOB.  He has no peripheral edema but CXR shows mild congestion and CVP is elevated 22-24   Dialyzes at [facility]  [phone]  EDW 81. HD Bath 2.5 calc, 2.0 K , Dialyzer 180, Heparin none. Access left AVF.  Hectorol 2 mcg q HD, no ESA , no heparin  Past Medical History  Diagnosis Date  . Renal disorder   . CHF (congestive heart failure)   . Diabetes mellitus without complication   . Coronary artery disease   . S/P bilateral BKA (below knee amputation)     Past Surgical History  Procedure Laterality Date  . Bilat bka    . Stents      History reviewed. No pertinent family history.  Social History:  reports that he has never smoked. He does not have any smokeless tobacco history on file. He reports that he does not drink alcohol or use illicit drugs.  Allergies:  Allergies  Allergen Reactions  . Simvastatin Other (See Comments)    unknown    Medications: I have reviewed the patient's current medications.   Results for orders placed or performed during the hospital encounter of 06/28/14 (from the past 48 hour(s))  Comprehensive metabolic panel     Status: Abnormal   Collection Time: 06/28/14  8:53 PM  Result Value Ref  Range   Sodium 137 135 - 145 mmol/L   Potassium 2.6 (LL) 3.5 - 5.1 mmol/L    Comment: REPEATED TO VERIFY CRITICAL RESULT CALLED TO, READ BACK BY AND VERIFIED WITH: M PACIFICO,RN [id number] [facility]    Chloride 96 96 - 112 mmol/L   CO2 31 19 - 32 mmol/L   Glucose, Bld 96 70 - 99 mg/dL   BUN 18 6 - 23 mg/dL   Creatinine, Ser 6.17 (H) 0.50 - 1.35 mg/dL   Calcium 9.2 8.4 - 10.5 mg/dL   Total Protein 7.5 6.0 - 8.3 g/dL   Albumin 2.9 (L) 3.5 - 5.2 g/dL   AST 31 0 - 37 U/L   ALT 10 0 - 53 U/L   Alkaline Phosphatase 123 (H) 39 - 117 U/L   Total Bilirubin 1.5 (H) 0.3 - 1.2 mg/dL   GFR calc non Af Amer 8 (L) >90 mL/min   GFR calc Af Amer 10 (L) >90 mL/min    Comment: (NOTE) The eGFR has been calculated using the CKD EPI equation. This calculation has not been validated in all clinical situations. eGFR's persistently <90 mL/min signify possible Chronic Kidney Disease.    Anion gap 10 5 - 15  Lipase, blood     Status: None   Collection  Time: 06/28/14  8:53 PM  Result Value Ref Range   Lipase 25 11 - 59 U/L  CBC with Differential     Status: Abnormal   Collection Time: 06/28/14  8:53 PM  Result Value Ref Range   WBC 3.2 (L) 4.0 - 10.5 K/uL   RBC 4.47 4.22 - 5.81 MIL/uL   Hemoglobin 12.7 (L) 13.0 - 17.0 g/dL   HCT 40.1 39.0 - 52.0 %   MCV 89.7 78.0 - 100.0 fL   MCH 28.4 26.0 - 34.0 pg   MCHC 31.7 30.0 - 36.0 g/dL   RDW 21.1 (H) 11.5 - 15.5 %   Platelets 79 (L) 150 - 400 K/uL    Comment: PLATELET COUNT CONFIRMED BY SMEAR REPEATED TO VERIFY SPECIMEN CHECKED FOR CLOTS PLATELET CLUMPS NOTED ON SMEAR, COUNT APPEARS DECREASED    Neutrophils Relative % 68 43 - 77 %   Neutro Abs 2.2 1.7 - 7.7 K/uL   Lymphocytes Relative 20 12 - 46 %   Lymphs Abs 0.6 (L) 0.7 - 4.0 K/uL   Monocytes Relative 8 3 - 12 %   Monocytes Absolute 0.3 0.1 - 1.0 K/uL   Eosinophils Relative 2 0 - 5 %   Eosinophils Absolute 0.1 0.0 - 0.7 K/uL   Basophils Relative 1 0 - 1 %   Basophils Absolute 0.0 0.0 - 0.1  K/uL  I-stat troponin, ED     Status: Abnormal   Collection Time: 06/28/14  8:55 PM  Result Value Ref Range   Troponin i, poc 0.14 (HH) 0.00 - 0.08 ng/mL   Comment NOTIFIED PHYSICIAN    Comment 3            Comment: Due to the release kinetics of cTnI, a negative result within the first hours of the onset of symptoms does not rule out myocardial infarction with certainty. If myocardial infarction is still suspected, repeat the test at appropriate intervals.   I-stat chem 8, ed     Status: Abnormal   Collection Time: 06/28/14  8:58 PM  Result Value Ref Range   Sodium 140 135 - 145 mmol/L   Potassium 2.7 (LL) 3.5 - 5.1 mmol/L   Chloride 96 96 - 112 mmol/L   BUN 21 6 - 23 mg/dL   Creatinine, Ser 5.50 (H) 0.50 - 1.35 mg/dL   Glucose, Bld 93 70 - 99 mg/dL   Calcium, Ion 1.09 (L) 1.13 - 1.30 mmol/L   TCO2 25 0 - 100 mmol/L   Hemoglobin 15.6 13.0 - 17.0 g/dL   HCT 46.0 39.0 - 52.0 %  I-Stat CG4 Lactic Acid, ED     Status: Abnormal   Collection Time: 06/28/14  8:59 PM  Result Value Ref Range   Lactic Acid, Venous 3.82 (HH) 0.5 - 2.0 mmol/L   Comment NOTIFIED PHYSICIAN   I-Stat CG4 Lactic Acid, ED     Status: Abnormal   Collection Time: 06/28/14 11:45 PM  Result Value Ref Range   Lactic Acid, Venous 4.61 (HH) 0.5 - 2.0 mmol/L   Comment NOTIFIED PHYSICIAN   Magnesium     Status: None   Collection Time: 06/28/14 11:50 PM  Result Value Ref Range   Magnesium 2.0 1.5 - 2.5 mg/dL  Phosphorus     Status: Abnormal   Collection Time: 06/28/14 11:50 PM  Result Value Ref Range   Phosphorus 5.1 (H) 2.3 - 4.6 mg/dL  Amylase     Status: None   Collection Time: 06/28/14 11:50 PM  Result Value Ref  Range   Amylase 47 0 - 105 U/L  Troponin I     Status: Abnormal   Collection Time: 06/28/14 11:50 PM  Result Value Ref Range   Troponin I 0.47 (H) <0.031 ng/mL    Comment:        PERSISTENTLY INCREASED TROPONIN VALUES IN THE RANGE OF 0.04-0.49 ng/mL CAN BE SEEN IN:       -UNSTABLE ANGINA        -CONGESTIVE HEART FAILURE       -MYOCARDITIS       -CHEST TRAUMA       -ARRYHTHMIAS       -LATE PRESENTING MYOCARDIAL INFARCTION       -COPD   CLINICAL FOLLOW-UP RECOMMENDED.   MRSA PCR Screening     Status: None   Collection Time: 06/29/14  1:02 AM  Result Value Ref Range   MRSA by PCR NEGATIVE NEGATIVE    Comment:        The GeneXpert MRSA Assay (FDA approved for NASAL specimens only), is one component of a comprehensive MRSA colonization surveillance program. It is not intended to diagnose MRSA infection nor to guide or monitor treatment for MRSA infections.   Glucose, capillary     Status: None   Collection Time: 06/29/14  2:06 AM  Result Value Ref Range   Glucose-Capillary 72 70 - 99 mg/dL  CBC     Status: Abnormal   Collection Time: 06/29/14  4:05 AM  Result Value Ref Range   WBC 5.2 4.0 - 10.5 K/uL   RBC 4.32 4.22 - 5.81 MIL/uL   Hemoglobin 12.4 (L) 13.0 - 17.0 g/dL    Comment: REPEATED TO VERIFY DELTA CHECK NOTED    HCT 38.7 (L) 39.0 - 52.0 %   MCV 89.6 78.0 - 100.0 fL   MCH 28.7 26.0 - 34.0 pg   MCHC 32.0 30.0 - 36.0 g/dL   RDW 21.0 (H) 11.5 - 15.5 %   Platelets 73 (L) 150 - 400 K/uL    Comment: REPEATED TO VERIFY CONSISTENT WITH PREVIOUS RESULT   Basic metabolic panel     Status: Abnormal   Collection Time: 06/29/14  4:05 AM  Result Value Ref Range   Sodium 139 135 - 145 mmol/L   Potassium 3.1 (L) 3.5 - 5.1 mmol/L   Chloride 100 96 - 112 mmol/L   CO2 31 19 - 32 mmol/L   Glucose, Bld 102 (H) 70 - 99 mg/dL   BUN 20 6 - 23 mg/dL   Creatinine, Ser 6.02 (H) 0.50 - 1.35 mg/dL   Calcium 8.7 8.4 - 10.5 mg/dL   GFR calc non Af Amer 9 (L) >90 mL/min   GFR calc Af Amer 10 (L) >90 mL/min    Comment: (NOTE) The eGFR has been calculated using the CKD EPI equation. This calculation has not been validated in all clinical situations. eGFR's persistently <90 mL/min signify possible Chronic Kidney Disease.    Anion gap 8 5 - 15  Magnesium     Status: None    Collection Time: 06/29/14  4:05 AM  Result Value Ref Range   Magnesium 1.9 1.5 - 2.5 mg/dL  Phosphorus     Status: Abnormal   Collection Time: 06/29/14  4:05 AM  Result Value Ref Range   Phosphorus 5.3 (H) 2.3 - 4.6 mg/dL  Troponin I     Status: Abnormal   Collection Time: 06/29/14  4:05 AM  Result Value Ref Range   Troponin I 1.16 (HH) <0.031  ng/mL    Comment:        POSSIBLE MYOCARDIAL ISCHEMIA. SERIAL TESTING RECOMMENDED. REPEATED TO VERIFY CRITICAL RESULT CALLED TO, READ BACK BY AND VERIFIED WITH: C HAYES,RN [id number] WILDERK   Lactic acid, plasma     Status: None   Collection Time: 06/29/14  4:05 AM  Result Value Ref Range   Lactic Acid, Venous 1.3 0.5 - 2.0 mmol/L  Glucose, capillary     Status: None   Collection Time: 06/29/14  4:07 AM  Result Value Ref Range   Glucose-Capillary 91 70 - 99 mg/dL  Protime-INR     Status: Abnormal   Collection Time: 06/29/14  6:09 AM  Result Value Ref Range   Prothrombin Time 26.2 (H) 11.6 - 15.2 seconds   INR 2.38 (H) 0.00 - 1.49    Dg Chest 2 View  06/28/2014   CLINICAL DATA:  Syncope  EXAM: CHEST  2 VIEW  COMPARISON:  03/25/2010  FINDINGS: There are intact appearances of the transvenous leads. There is unchanged moderate cardiomegaly. There is mild interstitial thickening and central vascular congestion which is new from 2011. This likely represents a degree of congestive heart failure. No confluent alveolar opacities are evident. No effusions are evident.  IMPRESSION: Probable mild congestive heart failure.   Electronically Signed   By: Andreas Newport M.D.   On: 06/28/2014 22:01   Ct Head Wo Contrast  06/28/2014   CLINICAL DATA:  Two episodes of syncope today  EXAM: CT HEAD WITHOUT CONTRAST  TECHNIQUE: Contiguous axial images were obtained from the base of the skull through the vertex without intravenous contrast.  COMPARISON:  None.  FINDINGS: The ventricles are normal in size and configuration. There is no intracranial mass,  hemorrhage, extra-axial fluid collection, or midline shift. There is slight small vessel disease in the centra semiovale bilaterally. Elsewhere, gray-white compartments appear normal. No acute infarct apparent. The bony calvarium appears intact. The mastoid air cells are clear. There is a small air-fluid level in the right maxillary antrum with a a retention cyst along the anterior inferior right maxillary antrum. There is rightward deviation of the nasal septum.  IMPRESSION: Mild periventricular small vessel disease. No intracranial mass, hemorrhage, or acute appearing infarct. Evidence of right maxillary sinus disease.   Electronically Signed   By: Lowella Grip III M.D.   On: 06/28/2014 21:30   Dg Chest Portable 1 View  06/29/2014   CLINICAL DATA:  Central line placement.  Subsequent evaluation.  EXAM: PORTABLE CHEST - 1 VIEW  COMPARISON:  Chest radiograph June 28, 2014  FINDINGS: The cardiac silhouette appears at least mild apparently enlarged coming with considerations low inspiratory examination. Cauda vascular markings without pleural effusion or focal consolidation. Calcified aortic knob. Strandy densities LEFT lung base.  Interval placement RIGHT internal jugular central venous catheter with distal tip projecting cavoatrial junction. No pneumothorax. Four lead LEFT cardiac defibrillator in situ. Soft tissue planes and included osseous structures are nonsuspicious. Moderate degenerative change of the thoracic spine.  IMPRESSION: RIGHT internal jugular central venous catheter distal tip projects cavoatrial junction. No pneumothorax.  Cardiomegaly.  LEFT lung base atelectasis/scar.   Electronically Signed   By: Elon Alas   On: 06/29/2014 02:05    ROS: positive for diarrhea- no SOB, no edema, no CP, no nausea- positive for syncope but says that he feels better   Blood pressure 95/59, pulse 30, temperature 97.8 F (36.6 C), temperature source Oral, resp. rate 23, height 6' 5"  (1.956 m),  weight  81.6 kg (179 lb 14.3 oz), SpO2 98 %. General appearance: fatigued and slowed mentation Eyes: conjunctivae/corneas clear. PERRL, EOM's intact. Fundi benign. Resp: diminished breath sounds bibasilar Cardio: regular rate and rhythm, S1, S2 normal, no murmur, click, rub or gallop GI: soft, non-tender; bowel sounds normal; no masses,  no organomegaly Extremities: extremities normal, atraumatic, no cyanosis or edema and bilat BKAs Neurologic: Mental status: Alert, oriented, thought content appropriate, slow mentation left forearm AVF with good thrill and bruit  Assessment/Plan: 69 year old BM with multiple medical issues including ESRD MWF at [facility] presents with diarrhea/syncope 1 syncope/hypotension- could this be all hypovolemia ? Also concern for cardiogenic hypotension given slightly elevated troponin.  Supportive care including giving a little volume back to see if can get him off pressors 2 ESRD: normally MWF via AVF - really no need for HD today as K and BUN low and I do not think is significantly volume overloaded in spite of what CVP says.  He is at his EDW.  I am OK with gently hydration for now to get pressors off.  I will re- evaluate tomorrow for HD need 3. Anemia of ESRD: this does not seem to be a significant issue for the patient , he has not needed ESA as OP 4. Metabolic Bone Disease: continue home meds of hectorol 2, sensipar 30 daily and renagel/renvela- his phos was OK, hold binders for now given GI issues 5. Hypokalemia- repleted, now above 3- if do dialysis will do high K bath   Ipek Westra A 06/29/2014, 11:45 AM

## 2014-06-29 NOTE — Progress Notes (Signed)
[facility] Progress Note Patient Name: Clayton [name] DOB: 04-09-1946 MRN: [id number]   Date of Service  06/29/2014  HPI/Events of Note  Remains hypotensive  eICU Interventions  Give ns bolus      Intervention Category Major Interventions: Shock - evaluation and management  Asencion Noble 06/29/2014, 4:28 PM

## 2014-06-29 NOTE — Plan of Care (Signed)
Problem: Consults Goal: Chronic Renal Failure Patient Education See Patient Education Module for education specifics.  Outcome: Progressing Renal physician assessment

## 2014-06-29 NOTE — Progress Notes (Signed)
Hypoglycemic Event  CBG: 63  Treatment: Dextrose 25 grams  Symptoms: none  Follow-up CBG: Time: 226 CBG Result: 98  Possible Reasons for Event: unknown  Comments/MD notified: Ivonne Andrew  Remember to initiate Hypoglycemia Order Set & complete

## 2014-06-29 NOTE — Progress Notes (Signed)
     Recent Labs Lab 06/28/14 2053 06/28/14 2058 06/29/14 0405  K 2.6* 2.7* 3.1*    K improved but still low in this ESRD patient  Plan 39meq x 1 run KCL  Dr. Brand Males, M.D., [facility].C.P Pulmonary and Critical Care Medicine Staff Physician [place] [facility] Pager: [phone], If no answer or between  15:00h - 7:00h: call [phone]  06/29/2014 5:49 AM

## 2014-06-29 NOTE — Progress Notes (Signed)
UR Completed.  [phone]  

## 2014-06-29 NOTE — Procedures (Signed)
Central Venous Catheter Insertion Procedure Note KHAMARION LEAK [id number] 01/07/1946  Procedure: Insertion of Central Venous Catheter Indications: Assessment of intravascular volume and Drug and/or fluid administration  Procedure Details Consent: Risks of procedure as well as the alternatives and risks of each were explained to the (patient/caregiver).  Consent for procedure obtained. Consent from wife at bedside  Time Out: Verified patient identification, verified procedure, site/side was marked, verified correct patient position, special equipment/implants available, medications/allergies/relevent history reviewed, required imaging and test results available.  Performed  Maximum sterile technique was used including antiseptics, cap, gloves, gown, hand hygiene, mask and sheet. Skin prep: Chlorhexidine; local anesthetic administered A antimicrobial bonded/coated triple lumen catheter was placed in the right internal jugular vein using the Seldinger technique.  Evaluation Blood flow good Complications: No apparent complications Patient did tolerate procedure well. Chest X-ray ordered to verify placement.  CXR: pending.   Performed under direct MD supervision.  Performed using ultrasound guidance.  Wire visualized in vessel under ultrasound.   [facility] 06/29/2014, 1:45 AM  Baltazar Apo, MD, PhD 06/29/2014, 2:23 AM [place] [facility] [phone] or if no answer [phone]

## 2014-06-29 NOTE — Progress Notes (Signed)
  Recent Labs Lab 06/28/14 2350 06/29/14 0405  TROPONINI 0.47* 1.16*    No results for input(s): INR in the last 168 hours.   Recent Labs Lab 06/28/14 2053 06/29/14 0405  PLT 79* 73*     Plan Start ekg Await echo Hold off heparin due to low platelet   Dr. Brand Males, M.D., [facility].C.P Pulmonary and Critical Care Medicine Staff Physician [place] [facility] Pager: [phone], If no answer or between  15:00h - 7:00h: call [phone]  06/29/2014 6:02 AM

## 2014-06-29 NOTE — Progress Notes (Addendum)
[place] Progress Note Patient Name: COBI TAFEL DOB: 1945-10-07 MRN: [id number]   Date of Service  06/29/2014  HPI/Events of Note  F/u lactic acid 2.7. ??septic shock state, still on vasopressors Principal Problem:   Coronary artery disease Active Problems:   Severe sepsis with septic shock   ESRD (end stage renal disease) on dialysis   Chronic systolic CHF (congestive heart failure), NYHA class 3   S/P bilateral BKA (below knee amputation)   DM type 2, uncontrolled, with renal complications   DM (diabetes mellitus), type 2 with peripheral vascular complications   Peripheral vascular disease due to secondary diabetes mellitus  See updated problem list   eICU Interventions  F/u cultures, start ABX.  See orders      Intervention Category Major Interventions: Shock - evaluation and management;Acid-Base disturbance - evaluation and management  Asencion Noble 06/29/2014, 5:49 PM

## 2014-06-29 NOTE — Progress Notes (Signed)
RN calling eMD  - CVP 21 - current fluids at 50cc/h - 0on dopamine  - having ectopy  Plan Change dopamine to neo; map goal > 60  Dr. Brand Males, M.D., [facility].C.P Pulmonary and Critical Care Medicine Staff Physician [place] [facility] Pager: [phone], If no answer or between  15:00h - 7:00h: call [phone]  06/29/2014 3:33 AM

## 2014-06-29 NOTE — Progress Notes (Signed)
*   Echocardiogram  has been performed.  Marygrace Drought M 06/29/2014, 4:03 PM

## 2014-06-30 ENCOUNTER — Encounter (HOSPITAL_COMMUNITY): Payer: Self-pay | Admitting: Physician Assistant

## 2014-06-30 ENCOUNTER — Inpatient Hospital Stay (HOSPITAL_COMMUNITY): Payer: Medicare Other

## 2014-06-30 DIAGNOSIS — R6521 Severe sepsis with septic shock: Secondary | ICD-10-CM

## 2014-06-30 DIAGNOSIS — I959 Hypotension, unspecified: Secondary | ICD-10-CM | POA: Diagnosis not present

## 2014-06-30 DIAGNOSIS — R570 Cardiogenic shock: Secondary | ICD-10-CM

## 2014-06-30 DIAGNOSIS — A419 Sepsis, unspecified organism: Principal | ICD-10-CM

## 2014-06-30 DIAGNOSIS — E1165 Type 2 diabetes mellitus with hyperglycemia: Secondary | ICD-10-CM

## 2014-06-30 DIAGNOSIS — I2510 Atherosclerotic heart disease of native coronary artery without angina pectoris: Secondary | ICD-10-CM

## 2014-06-30 DIAGNOSIS — Z992 Dependence on renal dialysis: Secondary | ICD-10-CM

## 2014-06-30 DIAGNOSIS — E1129 Type 2 diabetes mellitus with other diabetic kidney complication: Secondary | ICD-10-CM

## 2014-06-30 LAB — GLUCOSE, CAPILLARY
GLUCOSE-CAPILLARY: 62 mg/dL — AB (ref 70–99)
GLUCOSE-CAPILLARY: 82 mg/dL (ref 70–99)
Glucose-Capillary: 71 mg/dL (ref 70–99)
Glucose-Capillary: 84 mg/dL (ref 70–99)
Glucose-Capillary: 80 mg/dL (ref 70–99)
Glucose-Capillary: 116 mg/dL — ABNORMAL HIGH (ref 70–99)

## 2014-06-30 LAB — CARBOXYHEMOGLOBIN
CARBOXYHEMOGLOBIN: 1.4 % (ref 0.5–1.5)
Methemoglobin: 1 % (ref 0.0–1.5)
O2 Saturation: 68.7 %
Total hemoglobin: 12.4 g/dL — ABNORMAL LOW (ref 13.5–18.0)

## 2014-06-30 LAB — CBC WITH DIFFERENTIAL/PLATELET
Basophils Absolute: 0.1 10*3/uL (ref 0.0–0.1)
Basophils Relative: 1 % (ref 0–1)
EOS ABS: 0 10*3/uL (ref 0.0–0.7)
Eosinophils Relative: 0 % (ref 0–5)
HEMATOCRIT: 39 % (ref 39.0–52.0)
Hemoglobin: 12.4 g/dL — ABNORMAL LOW (ref 13.0–17.0)
LYMPHS ABS: 0.9 10*3/uL (ref 0.7–4.0)
Lymphocytes Relative: 13 % (ref 12–46)
MCH: 28.1 pg (ref 26.0–34.0)
MCHC: 31.8 g/dL (ref 30.0–36.0)
MCV: 88.2 fL (ref 78.0–100.0)
Monocytes Absolute: 0.9 10*3/uL (ref 0.1–1.0)
Monocytes Relative: 13 % — ABNORMAL HIGH (ref 3–12)
NEUTROS ABS: 4.7 10*3/uL (ref 1.7–7.7)
Neutrophils Relative %: 73 % (ref 43–77)
PLATELETS: 75 10*3/uL — AB (ref 150–400)
RBC: 4.42 MIL/uL (ref 4.22–5.81)
RDW: 21.5 % — AB (ref 11.5–15.5)
WBC: 6.6 10*3/uL (ref 4.0–10.5)

## 2014-06-30 LAB — RENAL FUNCTION PANEL
ALBUMIN: 2.5 g/dL — AB (ref 3.5–5.2)
Albumin: 2.5 g/dL — ABNORMAL LOW (ref 3.5–5.2)
Anion gap: 20 — ABNORMAL HIGH (ref 5–15)
Anion gap: 16 — ABNORMAL HIGH (ref 5–15)
BUN: 26 mg/dL — ABNORMAL HIGH (ref 6–23)
BUN: 25 mg/dL — ABNORMAL HIGH (ref 6–23)
CALCIUM: 8.7 mg/dL (ref 8.4–10.5)
CALCIUM: 8.5 mg/dL (ref 8.4–10.5)
CO2: 19 mmol/L (ref 19–32)
CO2: 20 mmol/L (ref 19–32)
CREATININE: 6.87 mg/dL — AB (ref 0.50–1.35)
Chloride: 99 mmol/L (ref 96–112)
Chloride: 101 mmol/L (ref 96–112)
Creatinine, Ser: 6.72 mg/dL — ABNORMAL HIGH (ref 0.50–1.35)
GFR calc Af Amer: 9 mL/min — ABNORMAL LOW (ref >90)
GFR calc Af Amer: 9 mL/min — ABNORMAL LOW (ref >90)
GFR calc non Af Amer: 7 mL/min — ABNORMAL LOW (ref >90)
GFR calc non Af Amer: 8 mL/min — ABNORMAL LOW (ref >90)
GLUCOSE: 85 mg/dL (ref 70–99)
Glucose, Bld: 99 mg/dL (ref 70–99)
PHOSPHORUS: 5.6 mg/dL — AB (ref 2.3–4.6)
Phosphorus: 5.6 mg/dL — ABNORMAL HIGH (ref 2.3–4.6)
Potassium: 3.2 mmol/L — ABNORMAL LOW (ref 3.5–5.1)
Potassium: 3.6 mmol/L (ref 3.5–5.1)
Sodium: 138 mmol/L (ref 135–145)
Sodium: 137 mmol/L (ref 135–145)

## 2014-06-30 LAB — HEPATIC FUNCTION PANEL
ALBUMIN: 2.5 g/dL — AB (ref 3.5–5.2)
ALT: 13 U/L (ref 0–53)
AST: 32 U/L (ref 0–37)
Alkaline Phosphatase: 106 U/L (ref 39–117)
BILIRUBIN INDIRECT: 1 mg/dL — AB (ref 0.3–0.9)
BILIRUBIN TOTAL: 1.8 mg/dL — AB (ref 0.3–1.2)
Bilirubin, Direct: 0.8 mg/dL — ABNORMAL HIGH (ref 0.0–0.5)
Total Protein: 6.5 g/dL (ref 6.0–8.3)

## 2014-06-30 LAB — PROTIME-INR
INR: 2.47 — AB (ref 0.00–1.49)
PROTHROMBIN TIME: 27 s — AB (ref 11.6–15.2)

## 2014-06-30 LAB — LACTIC ACID, PLASMA: LACTIC ACID, VENOUS: 3.9 mmol/L — AB (ref 0.5–2.0)

## 2014-06-30 LAB — TROPONIN I: Troponin I: 4.13 ng/mL (ref <0.031)

## 2014-06-30 MED ORDER — HEPARIN (PORCINE) 2000 UNITS/L FOR CRRT
INTRAVENOUS_CENTRAL | Status: DC | PRN
  Filled 2014-06-30: qty 1000

## 2014-06-30 MED ORDER — NOREPINEPHRINE BITARTRATE 1 MG/ML IV SOLN
0.0000 ug/min | INTRAVENOUS | Status: DC
  Administered 2014-06-30: 10 ug/min via INTRAVENOUS
  Filled 2014-06-30 (×2): qty 4

## 2014-06-30 MED ORDER — PRISMASOL BGK 4/2.5 32-4-2.5 MEQ/L IV SOLN
INTRAVENOUS | Status: DC
  Administered 2014-06-30 – 2014-07-06 (×8): via INTRAVENOUS_CENTRAL
  Filled 2014-06-30 (×12): qty 5000

## 2014-06-30 MED ORDER — DEXTROSE 50 % IV SOLN
INTRAVENOUS | Status: AC
  Filled 2014-06-30: qty 50

## 2014-06-30 MED ORDER — POTASSIUM CHLORIDE 10 MEQ/100ML IV SOLN
10.0000 meq | INTRAVENOUS | Status: AC
  Administered 2014-06-30 (×4): 10 meq via INTRAVENOUS
  Filled 2014-06-30 (×3): qty 100

## 2014-06-30 MED ORDER — DEXTROSE 50 % IV SOLN
25.0000 mL | Freq: Once | INTRAVENOUS | Status: AC
  Administered 2014-06-30: 25 mL via INTRAVENOUS
  Filled 2014-06-30: qty 50

## 2014-06-30 MED ORDER — HEPARIN SODIUM (PORCINE) 1000 UNIT/ML DIALYSIS
1000.0000 [IU] | INTRAMUSCULAR | Status: DC | PRN
  Administered 2014-07-06: 3000 [IU] via INTRAVENOUS_CENTRAL
  Filled 2014-06-30 (×2): qty 6
  Filled 2014-06-30: qty 3

## 2014-06-30 MED ORDER — VANCOMYCIN HCL IN DEXTROSE 750-5 MG/150ML-% IV SOLN
750.0000 mg | INTRAVENOUS | Status: DC
  Administered 2014-06-30: 750 mg via INTRAVENOUS
  Filled 2014-06-30 (×2): qty 150

## 2014-06-30 MED ORDER — DEXTROSE 50 % IV SOLN: 50.0000 mL | Freq: Once | INTRAVENOUS | Status: DC

## 2014-06-30 MED ORDER — VITAMIN K1 10 MG/ML IJ SOLN
10.0000 mg | Freq: Once | INTRAVENOUS | Status: AC
  Administered 2014-06-30: 10 mg via INTRAVENOUS
  Filled 2014-06-30: qty 1

## 2014-06-30 MED ORDER — PRISMASOL BGK 4/2.5 32-4-2.5 MEQ/L IV SOLN
INTRAVENOUS | Status: DC
  Administered 2014-06-30 – 2014-07-06 (×8): via INTRAVENOUS_CENTRAL
  Filled 2014-06-30 (×12): qty 5000

## 2014-06-30 MED ORDER — NOREPINEPHRINE BITARTRATE 1 MG/ML IV SOLN
0.0000 ug/min | INTRAVENOUS | Status: DC
  Administered 2014-06-30: 6 ug/min via INTRAVENOUS
  Administered 2014-07-02: 10 ug/min via INTRAVENOUS
  Administered 2014-07-03: 13 ug/min via INTRAVENOUS
  Administered 2014-07-03 – 2014-07-04 (×2): 15 ug/min via INTRAVENOUS
  Administered 2014-07-05: 4 ug/min via INTRAVENOUS
  Filled 2014-06-30 (×8): qty 16

## 2014-06-30 MED ORDER — PIPERACILLIN-TAZOBACTAM 3.375 G IVPB 30 MIN
3.3750 g | Freq: Four times a day (QID) | INTRAVENOUS | Status: DC
Start: 2014-06-30 — End: 2014-07-01
  Administered 2014-06-30 – 2014-07-01 (×3): 3.375 g via INTRAVENOUS
  Filled 2014-06-30 (×5): qty 50

## 2014-06-30 MED ORDER — SODIUM CHLORIDE 0.9 % IV SOLN: 250.0000 mL | INTRAVENOUS | Status: DC | PRN

## 2014-06-30 MED ORDER — PRISMASOL BGK 4/2.5 32-4-2.5 MEQ/L IV SOLN
INTRAVENOUS | Status: DC
  Administered 2014-06-30 – 2014-07-06 (×24): via INTRAVENOUS_CENTRAL
  Filled 2014-06-30 (×33): qty 5000

## 2014-06-30 MED ORDER — MIDODRINE HCL 5 MG PO TABS
10.0000 mg | ORAL_TABLET | Freq: Two times a day (BID) | ORAL | Status: DC
  Administered 2014-06-30 – 2014-07-10 (×18): 10 mg via ORAL
  Filled 2014-06-30 (×23): qty 2

## 2014-06-30 MED ORDER — INSULIN ASPART 100 UNIT/ML ~~LOC~~ SOLN
0.0000 [IU] | Freq: Three times a day (TID) | SUBCUTANEOUS | Status: DC
  Administered 2014-07-03: 2 [IU] via SUBCUTANEOUS
  Administered 2014-07-04 – 2014-07-07 (×8): 1 [IU] via SUBCUTANEOUS
  Administered 2014-07-07: 2 [IU] via SUBCUTANEOUS

## 2014-06-30 NOTE — Progress Notes (Addendum)
Subjective:  Remained pressor dependent overnight- in fact dose has been titrated up- he looks great, says he feels like normal- no sxms at all- received over 3 liters of fluid- finally starting to show some dependent edema Objective Vital signs in last 24 hours: Filed Vitals:   06/30/14 0515 06/30/14 0530 06/30/14 0545 06/30/14 0600  BP: 105/63 110/54 95/55 100/65  Pulse:  46    Temp:      TempSrc:      Resp: 17 28 27 17   Height:      Weight:      SpO2:  90%  93%   Weight change: 9.478 kg (20 lb 14.3 oz)  Intake/Output Summary (Last 24 hours) at 06/30/14 0657 Last data filed at 06/30/14 0600  Gross per 24 hour  Intake 3366.08 ml  Output      0 ml  Net 3366.08 ml   Assessment/Plan: 69 year old BM with multiple medical issues including ESRD MWF at [facility] presents with diarrhea/syncope 1 syncope/hypotension- could this be all hypovolemia ? Also concern for cardiogenic hypotension given slightly elevated troponin, vs sepsis on broad spectrum antibiotics. Supportive care including giving [name] little volume back to see if can get him off pressors.  So far this has not been successful.  I would not give him anymore extra fluid because I feel his tank is full.  I would favor [name] fairly aggressive attempt to get off pressors, possibly accepting [name] SBP of 80's if still awake and alert to see if could get off pressors today- will give midodrine to maybe help ? 2 ESRD: normally MWF via AVF - no HD since Friday-  really no need for HD yesterday, labs pending for this AM.  His tank is full and then some which may force our hand. He is 3 kg over his EDW. I of course would prefer to do intermittent HD with his AVF rather than to need to place [name] line to do CRRT.  Again, I favor aggressive wean of pressors to see if could do HD possibly later in the day ? 3. Anemia of ESRD: this does not seem to be [name] significant issue for the patient , he has not needed ESA as OP 4. Metabolic Bone Disease: continue home  meds of hectorol 2, sensipar 30 daily and renagel/renvela- his phos was OK, hold binders for now given GI issues 5. Hypokalemia- repleted, now above 3-  6. Diarrhea- pt says is better   Clayton [name]    Labs: Basic Metabolic Panel:  Recent Labs Lab 06/28/14 2053 06/28/14 2058 06/28/14 2350 06/29/14 0405 06/29/14 1100  NA 137 140  --  139 139  K 2.6* 2.7*  --  3.1* 3.4*  CL 96 96  --  100 100  CO2 31  --   --  31 25  GLUCOSE 96 93  --  102* 93  BUN 18 21  --  20 21  CREATININE 6.17* 5.50*  --  6.02* 6.42*  CALCIUM 9.2  --   --  8.7 8.9  PHOS  --   --  5.1* 5.3*  --    Liver Function Tests:  Recent Labs Lab 06/28/14 2053  AST 31  ALT 10  ALKPHOS 123*  BILITOT 1.5*  PROT 7.5  ALBUMIN 2.9*    Recent Labs Lab 06/28/14 2053 06/28/14 2350  LIPASE 25  --   AMYLASE  --  47   No results for input(s): AMMONIA in the last 168 hours.  CBC:  Recent Labs Lab 06/28/14 2053 06/28/14 2058 06/29/14 0405  WBC 3.2*  --  5.2  NEUTROABS 2.2  --   --   HGB 12.7* 15.6 12.4*  HCT 40.1 46.0 38.7*  MCV 89.7  --  89.6  PLT 79*  --  73*   Cardiac Enzymes:  Recent Labs Lab 06/28/14 2350 06/29/14 0405 06/29/14 1100  TROPONINI 0.47* 1.16* 3.16*   CBG:  Recent Labs Lab 06/29/14 1426 06/29/14 1558 06/29/14 2006 06/29/14 2353 06/30/14 0434  GLUCAP 98 80 77 82 71    Iron Studies: No results for input(s): IRON, TIBC, TRANSFERRIN, FERRITIN in the last 72 hours. Studies/Results: Dg Chest 2 View  06/28/2014   CLINICAL DATA:  Syncope  EXAM: CHEST  2 VIEW  COMPARISON:  03/25/2010  FINDINGS: There are intact appearances of the transvenous leads. There is unchanged moderate cardiomegaly. There is mild interstitial thickening and central vascular congestion which is new from 2011. This likely represents [name] degree of congestive heart failure. No confluent alveolar opacities are evident. No effusions are evident.  IMPRESSION: Probable mild congestive heart failure.    Electronically Signed   By: Andreas Newport M.D.   On: 06/28/2014 22:01   Ct Head Wo Contrast  06/28/2014   CLINICAL DATA:  Two episodes of syncope today  EXAM: CT HEAD WITHOUT CONTRAST  TECHNIQUE: Contiguous axial images were obtained from the base of the skull through the vertex without intravenous contrast.  COMPARISON:  None.  FINDINGS: The ventricles are normal in size and configuration. There is no intracranial mass, hemorrhage, extra-axial fluid collection, or midline shift. There is slight small vessel disease in the centra semiovale bilaterally. Elsewhere, gray-white compartments appear normal. No acute infarct apparent. The bony calvarium appears intact. The mastoid air cells are clear. There is [name] small air-fluid level in the right maxillary antrum with [name] [name] retention cyst along the anterior inferior right maxillary antrum. There is rightward deviation of the nasal septum.  IMPRESSION: Mild periventricular small vessel disease. No intracranial mass, hemorrhage, or acute appearing infarct. Evidence of right maxillary sinus disease.   Electronically Signed   By: Lowella Grip III M.D.   On: 06/28/2014 21:30   Dg Chest Portable 1 View  06/29/2014   CLINICAL DATA:  Central line placement.  Subsequent evaluation.  EXAM: PORTABLE CHEST - 1 VIEW  COMPARISON:  Chest radiograph June 28, 2014  FINDINGS: The cardiac silhouette appears at least mild apparently enlarged coming with considerations low inspiratory examination. Cauda vascular markings without pleural effusion or focal consolidation. Calcified aortic knob. Strandy densities LEFT lung base.  Interval placement RIGHT internal jugular central venous catheter with distal tip projecting cavoatrial junction. No pneumothorax. Four lead LEFT cardiac defibrillator in situ. Soft tissue planes and included osseous structures are nonsuspicious. Moderate degenerative change of the thoracic spine.  IMPRESSION: RIGHT internal jugular central venous  catheter distal tip projects cavoatrial junction. No pneumothorax.  Cardiomegaly.  LEFT lung base atelectasis/scar.   Electronically Signed   By: Elon Alas   On: 06/29/2014 02:05   Medications: Infusions: . sodium chloride 50 mL/hr at 06/29/14 0158  . dextrose 5 % and 0.9% NaCl 50 mL/hr at 06/29/14 1637  . phenylephrine (NEO-SYNEPHRINE) Adult infusion 135 mcg/min (06/30/14 0522)    Scheduled Medications: . antiseptic oral rinse  7 mL Mouth Rinse BID  . cinacalcet  30 mg Oral Q breakfast  . [START ON 07/01/2014] doxercalciferol  2 mcg Intravenous Q M,W,F-HD  . famotidine (PEPCID) IV  20 mg Intravenous Q24H  . insulin aspart  0-9 Units Subcutaneous 6 times per day  . piperacillin-tazobactam (ZOSYN)  IV  2.25 g Intravenous Q8H  . [START ON 07/01/2014] vancomycin  1,000 mg Intravenous Q M,W,F-HD    have reviewed scheduled and prn medications.  Physical Exam: General: alert, looks fine Heart: RRR Lungs: mostly clear- but with some decreased BS at bases Abdomen: soft, non tender Extremities: dependent pitting edema Dialysis Access: left AVF - patent    06/30/2014,6:57 AM  LOS: 2 days

## 2014-06-30 NOTE — Progress Notes (Signed)
PULMONARY / CRITICAL CARE MEDICINE   Name: Clayton [name] MRN: [id number] DOB: Jan 14, 1946    ADMISSION DATE:  06/28/2014  REFERRING MD :  EDP   CHIEF COMPLAINT:  Hypotension   INITIAL PRESENTATION: 69 yo male with hx ESRD, CHF, DM, CAD, bilat BKA who receives all his care at [place].  He presented 2/14 after a fall at home with syncope.  He has had diarrhea x 2-3 weeks with negative GI w/u thus far at [facility] (was scheduled for upper/lower scope next week).  In ER was significantly hypotensive with SBP 60's, hypokalemic with K 2.4.  No sig fluid resuscitation given r/t ESRD and CHF.  PCCM called to admit.   STUDIES:  CT head 2/14>>> neg acute  2D echo 2/14>>> LVEF is severely depressed at approximately 20% with hypokinesis of the inferiore wall (base, mid), anterior wall (base, minimally mid)m basal lateral wall;, basal posterior wall; akinesis elsewhere.  SIGNIFICANT EVENTS: 2/14 >> Hypotension s/p 1L fluids - Neo &Dopa started 2/15 >> soft BP persist; On Neo;  2/16 >> Midodrine started by Renal  SUBJECTIVE: Denies diarrhea currently. Denies CP, SOB or Ab pain.   VITAL SIGNS: Temp:  [97.6 F (36.4 C)-98.9 F (37.2 C)] 97.7 F (36.5 C) (02/16 0435) Pulse Rate:  [25-93] 46 (02/16 0530) Resp:  [0-35] 17 (02/16 0600) BP: (75-114)/(44-80) 100/65 mmHg (02/16 0600) SpO2:  [81 %-100 %] 93 % (02/16 0600) Weight:  [186 lb 4.6 oz (84.5 kg)] 186 lb 4.6 oz (84.5 kg) (02/16 0500) HEMODYNAMICS: CVP:  [20 mmHg-47 mmHg] 27 mmHg VENTILATOR SETTINGS:   INTAKE / OUTPUT:  Intake/Output Summary (Last 24 hours) at 06/30/14 0718 Last data filed at 06/30/14 0600  Gross per 24 hour  Intake 3280.45 ml  Output      0 ml  Net 3280.45 ml    PHYSICAL EXAMINATION: General:  Chronically ill appearing male, NAD Neuro:  A&O; Normal mentation  HEENT:  Mm dry; no JVD  Cardiovascular:  s1s2 irreg; murmur present  Lungs:  resps even non labored  Abdomen:  Soft, +bs, non tender  Musculoskeletal:  bilat BKA    LABS:  CBC  Recent Labs Lab 06/28/14 2053 06/28/14 2058 06/29/14 0405  WBC 3.2*  --  5.2  HGB 12.7* 15.6 12.4*  HCT 40.1 46.0 38.7*  PLT 79*  --  73*   Coag's  Recent Labs Lab 06/29/14 0609 06/30/14 0540  INR 2.38* 2.47*   BMET  Recent Labs Lab 06/28/14 2053 06/28/14 2058 06/29/14 0405 06/29/14 1100  NA 137 140 139 139  K 2.6* 2.7* 3.1* 3.4*  CL 96 96 100 100  CO2 31  --  31 25  BUN 18 21 20 21   CREATININE 6.17* 5.50* 6.02* 6.42*  GLUCOSE 96 93 102* 93   Electrolytes  Recent Labs Lab 06/28/14 2053 06/28/14 2350 06/29/14 0405 06/29/14 1100  CALCIUM 9.2  --  8.7 8.9  MG  --  2.0 1.9  --   PHOS  --  5.1* 5.3*  --    Sepsis Markers  Recent Labs Lab 06/28/14 2345 06/29/14 0405 06/29/14 1629  LATICACIDVEN 4.61* 1.3 2.7*   ABG No results for input(s): PHART, PCO2ART, PO2ART in the last 168 hours. Liver Enzymes  Recent Labs Lab 06/28/14 2053  AST 31  ALT 10  ALKPHOS 123*  BILITOT 1.5*  ALBUMIN 2.9*   Cardiac Enzymes  Recent Labs Lab 06/28/14 2350 06/29/14 0405 06/29/14 1100  TROPONINI 0.47* 1.16* 3.16*   Glucose  Recent  Labs Lab 06/29/14 1211 06/29/14 1426 06/29/14 1558 06/29/14 2006 06/29/14 2353 06/30/14 0434  GLUCAP 68* 98 80 77 82 71    Imaging Dg Chest Portable 1 View  06/29/2014   CLINICAL DATA:  Central line placement.  Subsequent evaluation.  EXAM: PORTABLE CHEST - 1 VIEW  COMPARISON:  Chest radiograph June 28, 2014  FINDINGS: The cardiac silhouette appears at least mild apparently enlarged coming with considerations low inspiratory examination. Cauda vascular markings without pleural effusion or focal consolidation. Calcified aortic knob. Strandy densities LEFT lung base.  Interval placement RIGHT internal jugular central venous catheter with distal tip projecting cavoatrial junction. No pneumothorax. Four lead LEFT cardiac defibrillator in situ. Soft tissue planes and included osseous structures are  nonsuspicious. Moderate degenerative change of the thoracic spine.  IMPRESSION: RIGHT internal jugular central venous catheter distal tip projects cavoatrial junction. No pneumothorax.  Cardiomegaly.  LEFT lung base atelectasis/scar.   Electronically Signed   By: Elon Alas   On: 06/29/2014 02:05    ASSESSMENT / PLAN:  PULMONARY Pulmonary HTN CXR w/o acute process  P:   Supplemental O2 as needed   CARDIOVASCULAR CVL  2/14>>> CAD; HFrEF (20%)  DM Hypotension likely 2/2 hypovolemia in setting diarrhea and HF Low BP at baseline HTN  Pacemaker/ AICD  P:  Troponin: Trending up likely demand ischemia - Consulting Cardiology EKG: No acute changes KVO fluids  Following lactate - Increased while still on pressors  CVP: elevated; KVO fluids Midodrine started 2/16 Switch Neo to Levo 04/29/14  RENAL ESRD - MWF HD - did get full HD Friday without difficulty  Hypokalemia  P:   Replete K - 4 runs 2/16 Renal following; May need CRRT if unable to get off pressors  GASTROINTESTINAL Diarrhea - ongoing x 3weeks.  Denies abd pain, nausea. 1 loose BM since admit P:   pepcid  zofran PRN  Consider GI input - hold off for now until hemodynamically stable  Renal diet  HEMATOLOGIC Thrombocytopenia  Leukopenia - Resolved  P:  F/u CBC  No current DVT prophylaxis given plts low and bilateral BKA's and INR current 2.47  INFECTIOUS ?enteritis  P:   BCx >> ordered Vanc 2/15 >> Day 1/x Zosyn 2/15 >> Day 1/x  ENDOCRINE DM  P:   SSI  NEUROLOGIC Syncope - suspect r/t significant hypotension/ volume depletion- Resolved CT head negative P:   Supportive care    FAMILY  - Updates: updated at bedside 2/15  - Inter-disciplinary family meet or Palliative Care meeting due by:  2/21  Olam Idler, MD 06/30/2014, 7:18 AM PGY-2, [facility]   ATTENDING NOTE: I have personally reviewed patient's available data, including medical history, events of note, physical  examination and test results as part of my evaluation. I have discussed with resident/NP and other careteam providers such as pharmacist, RN and RRT & co-ordinated with consultants. In addition, I personally evaluated patient and elicited key history of cadiomyopathy -EF35%, ESRD on HD with diarrhea, exam findings of remains on pressors, no rub, clear lungs & labs showing pos trop, nml potassium.  Imp - NSTEMI, worsening LV function - prob explains refractory shock  - will switch to levo gtt, get cards input for inotropes , chk co-ox x1 Correct coagulopathy w ith vit K x 1 Needs HD cath for CRRT - d/w renal Rest per NP/medical resident whose note is outlined above and that I agree with and edited in full.    The patient is critically ill  with multiple organ systems failure and requires high complexity decision making for assessment and support, frequent evaluation and titration of therapies, application of advanced monitoring technologies and extensive interpretation of multiple databases. Critical Care Time devoted to patient care services described in this note independent of APP time is 35 minutes.    Rigoberto Noel MD

## 2014-06-30 NOTE — Consult Note (Signed)
CONSULT NOTE  Date: 06/30/2014               Patient Name:  Clayton [name] MRN: [id number]  DOB: 02-14-1946 Age / Sex: 69 y.o., male        PCP: [name] Primary Cardiologist: Lattie Haw            Referring Physician: Lamonte Sakai              Reason for Consult:  elevated troponin levels in the setting of sepsis and congestive heart failure            History of Present Illness: Patient is a 69 y.o. male with a PMHx of end-stage renal disease, congestive heart failure, diabetes mellitus, coronary artery disease, bilateral BKA, who was admitted to [facility] on 06/28/2014 for evaluation of generalized weakness and an episode of syncope.  The patient has multiple medical problems. He gets all of his care at the [facility]. He presented to [facility] on February 14 after falling at home after an episode of syncope. He's had diarrhea for the past 2-3 weeks. His diarrhea has [facility] and as of yet no clear etiology has been found. In the emergency room he was found to be severely hypotensive with systolic blood pressure in the 60s. He was hypokalemic.  He had an echocardiogram performed on February 15 that revealed severely depressed left ventricular systolic function. His ejection fraction was 20%..  In addition, he was found have severe right ventricular failure.  The patient has had long-standing diabetes for many years. He has bilateral BKA's related to diabetic foot infections.  He's not been ambulatory in many years.  Medications: Outpatient medications: Prescriptions prior to admission  Medication Sig Dispense Refill Last Dose  . cinacalcet (SENSIPAR) 30 MG tablet Take 30 mg by mouth daily.   06/28/2014 at Unknown time  . mirtazapine (REMERON) 30 MG tablet Take 30 mg by mouth at bedtime.   06/27/2014 at Unknown time  . Omega-3 Fatty Acids (OMEGA-3 FISH OIL PO) Take 1 capsule by mouth daily.   06/28/2014 at Unknown time  . sertraline  (ZOLOFT) 25 MG tablet Take 25 mg by mouth daily.   06/28/2014 at Unknown time  . sevelamer (RENAGEL) 800 MG tablet Take 1,600 mg by mouth 3 (three) times daily.   06/28/2014 at Unknown time    Current medications: Current Facility-Administered Medications  Medication Dose Route Frequency Provider Last Rate Last Dose  . antiseptic oral rinse (CPC / CETYLPYRIDINIUM CHLORIDE 0.05%) solution 7 mL  7 mL Mouth Rinse BID Collene Gobble, MD   7 mL at 06/30/14 1000  . cinacalcet (SENSIPAR) tablet 30 mg  30 mg Oral Q breakfast Louis Meckel, MD   30 mg at 06/30/14 0801  . dextrose 5 %-0.9 % sodium chloride infusion   Intravenous Continuous Olam Idler, MD 10 mL/hr at 06/30/14 1059    . [START ON 07/01/2014] doxercalciferol (HECTOROL) injection 2 mcg  2 mcg Intravenous Q M,W,F-HD Louis Meckel, MD      . famotidine (PEPCID) IVPB 20 mg  20 mg Intravenous Q24H Collene Gobble, MD   20 mg at 06/30/14 0433  . heparin injection 1,000-6,000 Units  1,000-6,000 Units CRRT PRN Louis Meckel, MD      . heparinized saline (2000 units/L) primer fluid for CRRT   CRRT PRN Louis Meckel, MD      . insulin  aspart (novoLOG) injection 0-9 Units  0-9 Units Subcutaneous TID WC Olam Idler, MD   0 Units at 06/30/14 1200  . midodrine (PROAMATINE) tablet 10 mg  10 mg Oral BID WC Louis Meckel, MD   10 mg at 06/30/14 0801  . norepinephrine (LEVOPHED) 4 mg in dextrose 5 % 250 mL (0.016 mg/mL) infusion  0-40 mcg/min Intravenous Titrated Olam Idler, MD      . ondansetron [facility]) injection 4 mg  4 mg Intravenous Q6H PRN Marijean Heath, NP      . phytonadione (VITAMIN K) 10 mg in dextrose 5 % 50 mL IVPB  10 mg Intravenous Once Kara Mead V, MD      . piperacillin-tazobactam (ZOSYN) IVPB 2.25 g  2.25 g Intravenous Q8H Rebecka Apley, RPH   2.25 g at 06/30/14 0913  . prismasol BGK 4/2.5 5,000 mL dialysis replacement fluid   CRRT Continuous Louis Meckel, MD      . prismasol BGK  4/2.5 5,000 mL dialysis replacement fluid   CRRT Continuous Louis Meckel, MD      . prismasol BGK 4/2.5 5,000 mL dialysis solution   CRRT Continuous Louis Meckel, MD      . Derrill Memo ON 07/01/2014] vancomycin (VANCOCIN) IVPB 1000 mg/200 mL premix  1,000 mg Intravenous Q M,W,F-HD Rebecka Apley, [facility]         Allergies  Allergen Reactions  . Simvastatin Other (See Comments)    unknown     Past Medical History  Diagnosis Date  . Renal disorder   . CHF (congestive heart failure)   . Diabetes mellitus without complication   . Coronary artery disease   . S/P bilateral BKA (below knee amputation)     Past Surgical History  Procedure Laterality Date  . Below knee leg amputation Bilateral   . Cardiac catheterization  1998/1999    stents both times, no other details available  . Cardiac defibrillator placement  2010    [employer]    History reviewed. No pertinent family history.  Social History:  reports that he has never smoked. He does not have any smokeless tobacco history on file. He reports that he does not drink alcohol or use illicit drugs.   Review of Systems: Constitutional:  denies fever, chills, diaphoresis, appetite change and fatigue.  HEENT: denies photophobia, eye pain, redness, hearing loss, ear pain, congestion, sore throat, rhinorrhea, sneezing, neck pain, neck stiffness and tinnitus.  Respiratory: admits to SOB, DOE, cough, chest tightness, and wheezing.  Cardiovascular: denies chest pain, palpitations and leg swelling.  Gastrointestinal: denies nausea, vomiting, abdominal pain, diarrhea, constipation, blood in stool.  Genitourinary: denies dysuria, urgency, frequency, hematuria, flank pain and difficulty urinating.  Musculoskeletal: denies  myalgias, back pain, joint swelling, arthralgias and gait problem.   Skin: denies pallor, rash and wound.  Neurological: denies dizziness, seizures, syncope, weakness, light-headedness, numbness and headaches.     Hematological: denies adenopathy, easy bruising, personal or family bleeding history.  Psychiatric/ Behavioral: denies suicidal ideation, mood changes, confusion, nervousness, sleep disturbance and agitation.    Physical Exam: BP 85/70 mmHg  Pulse 61  Temp(Src) 97.6 F (36.4 C) (Oral)  Resp 27  Ht 6\' 5"  (1.956 m)  Wt 186 lb 4.6 oz (84.5 kg)  BMI 22.09 kg/m2  SpO2 95%  Wt Readings from Last 3 Encounters:  06/30/14 186 lb 4.6 oz (84.5 kg)    General: Vital signs reviewed and noted.  Chronically ill-appearing gentleman   Head: Normocephalic,  atraumatic, sclera anicteric,   Neck: Supple. Negative for carotid bruits. Is a multi-lumen left IJ HD catheter in place   Lungs:  Clear bilaterally, no  wheezes, rales, or rhonchi. Breathing is normal   Heart: RRR with S1 S2. Very soft heart sounds   Abdomen/ GI :  Soft, non-tender, non-distended with normoactive bowel sounds. No hepatomegaly. No rebound/guarding. No obvious abdominal masses   MSK:  he status post bilateral BKA's. He has a minimally  functioning dialysis graft in his left forearm. Thrill is not very pronounced and there is not much better bruit. The flow through the graft appears to be minimal.   Extremities: No clubbing or cyanosis.   Neurologic:  CN are grossly intact,    Psych: Responds to questions appropriately with a normal affect.     Lab results: Basic Metabolic Panel:  Recent Labs Lab 06/28/14 2350 06/29/14 0405 06/29/14 1100 06/30/14 0540  NA  --  139 139 138  K  --  3.1* 3.4* 3.2*  CL  --  100 100 99  CO2  --  31 25 19   GLUCOSE  --  102* 93 99  BUN  --  20 21 26*  CREATININE  --  6.02* 6.42* 6.87*  CALCIUM  --  8.7 8.9 8.7  MG 2.0 1.9  --   --   PHOS 5.1* 5.3*  --  5.6*    Liver Function Tests:  Recent Labs Lab 06/28/14 2053 06/30/14 0540 06/30/14 0830  AST 31  --  32  ALT 10  --  13  ALKPHOS 123*  --  106  BILITOT 1.5*  --  1.8*  PROT 7.5  --  6.5  ALBUMIN 2.9* 2.5* 2.5*    Recent  Labs Lab 06/28/14 2053 06/28/14 2350  LIPASE 25  --   AMYLASE  --  47   No results for input(s): AMMONIA in the last 168 hours.  CBC:  Recent Labs Lab 06/28/14 2053 06/28/14 2058 06/29/14 0405 06/30/14 0830  WBC 3.2*  --  5.2 6.6  NEUTROABS 2.2  --   --  4.7  HGB 12.7* 15.6 12.4* 12.4*  HCT 40.1 46.0 38.7* 39.0  MCV 89.7  --  89.6 88.2  PLT 79*  --  73* 75*    Cardiac Enzymes:  Recent Labs Lab 06/28/14 2350 06/29/14 0405 06/29/14 1100 06/30/14 0540  TROPONINI 0.47* 1.16* 3.16* 4.13*    BNP: Invalid input(s): POCBNP  CBG:  Recent Labs Lab 06/29/14 2006 06/29/14 2353 06/30/14 0434 06/30/14 0748 06/30/14 1138  GLUCAP 77 82 71 84 80    Coagulation Studies:  Recent Labs  06/29/14 0609 06/30/14 0540  LABPROT 26.2* 27.0*  INR 2.38* 2.47*     Other results:  Personal review of EKG shows :  -  AV demand pacing.  Ventricular ectopy.    Imaging: Dg Chest 2 View  06/28/2014   CLINICAL DATA:  Syncope  EXAM: CHEST  2 VIEW  COMPARISON:  03/25/2010  FINDINGS: There are intact appearances of the transvenous leads. There is unchanged moderate cardiomegaly. There is mild interstitial thickening and central vascular congestion which is new from 2011. This likely represents a degree of congestive heart failure. No confluent alveolar opacities are evident. No effusions are evident.  IMPRESSION: Probable mild congestive heart failure.   Electronically Signed   By: Andreas Newport M.D.   On: 06/28/2014 22:01   Ct Head Wo Contrast  06/28/2014   CLINICAL DATA:  Two episodes of syncope  today  EXAM: CT HEAD WITHOUT CONTRAST  TECHNIQUE: Contiguous axial images were obtained from the base of the skull through the vertex without intravenous contrast.  COMPARISON:  None.  FINDINGS: The ventricles are normal in size and configuration. There is no intracranial mass, hemorrhage, extra-axial fluid collection, or midline shift. There is slight small vessel disease in the centra  semiovale bilaterally. Elsewhere, gray-white compartments appear normal. No acute infarct apparent. The bony calvarium appears intact. The mastoid air cells are clear. There is a small air-fluid level in the right maxillary antrum with a a retention cyst along the anterior inferior right maxillary antrum. There is rightward deviation of the nasal septum.  IMPRESSION: Mild periventricular small vessel disease. No intracranial mass, hemorrhage, or acute appearing infarct. Evidence of right maxillary sinus disease.   Electronically Signed   By: Lowella Grip III M.D.   On: 06/28/2014 21:30   Dg Chest Portable 1 View  06/29/2014   CLINICAL DATA:  Central line placement.  Subsequent evaluation.  EXAM: PORTABLE CHEST - 1 VIEW  COMPARISON:  Chest radiograph June 28, 2014  FINDINGS: The cardiac silhouette appears at least mild apparently enlarged coming with considerations low inspiratory examination. Cauda vascular markings without pleural effusion or focal consolidation. Calcified aortic knob. Strandy densities LEFT lung base.  Interval placement RIGHT internal jugular central venous catheter with distal tip projecting cavoatrial junction. No pneumothorax. Four lead LEFT cardiac defibrillator in situ. Soft tissue planes and included osseous structures are nonsuspicious. Moderate degenerative change of the thoracic spine.  IMPRESSION: RIGHT internal jugular central venous catheter distal tip projects cavoatrial junction. No pneumothorax.  Cardiomegaly.  LEFT lung base atelectasis/scar.   Electronically Signed   By: Elon Alas   On: 06/29/2014 02:05     Last Echo  - 06/29/14 Left ventricle: LVEF is severely depressed at approximately 20% with hypokinesis of the inferiore wall (base, mid), anterior wall (base, minimally mid)m basal lateral wall;, basal posterior wall; akinesis elsewhere. The cavity size was moderately dilated. - Left atrium: The atrium was moderately dilated. - Right  ventricle: The cavity size was moderately dilated. Systolic function was moderately to severely reduced. - Right atrium: The atrium was moderately to severely dilated  Chest x-ray: I personally reviewed the chest x-ray. He has moderate to severe cardiomegaly with right and left ventricular enlargement.  Assessment & Plan:  1. Acute on chronic systolic congestive heart failure. The patient appears to have end-stage congestive heart failure. He's hypotensive at present. He's been started on Levophed ( which has not been started yet). I think that his chances of surviving this is low.    Mouth is that he will improve as we generally support him with Levothroid and CRRT.  I've request that we follow CVP.  He has an ICD in place.   2. Non-ST segment elevation myocardial infarction.   His troponin levels have increased consistent with myocardial infarction. Is EKG does not suggest the location of this but it's quite likely that he has diffuse coronary artery disease and diffuse ischemia because of his severe hypotension. At this point is not candidate for cardiac catheterization.  3. End-stage renal disease: The patient has had a hemodialysis catheter placed. Right now his blood pressure is too low to consider CRRT or dialysis. Hopefully   this will improve with Levophed  therapy.  4. Chronic diarrhea:  Plans per Int. Med. Team .   I've talked to his wife and explained that he was very ill and that we  would continue to do all that we can to get him better.   Thayer Headings, Brooke Bonito., MD, [facility] 06/30/2014, 12:55 PM Office - [phone] Pager [phone]

## 2014-06-30 NOTE — Procedures (Signed)
Central Venous Catheter Insertion Procedure Note ZELL DRINKARD [id number] 1945/10/10  Procedure: Insertion of Central Venous Catheter Indications: Drug and/or fluid administration, CRRT  Procedure Details Consent: Risks of procedure as well as the alternatives and risks of each were explained to the (patient/caregiver).  Consent for procedure obtained. Time Out: Verified patient identification, verified procedure, site/side was marked, verified correct patient position, special equipment/implants available, medications/allergies/relevent history reviewed, required imaging and test results available.  Performed  Maximum sterile technique was used including antiseptics, cap, gloves, gown, hand hygiene, mask and sheet. Skin prep: Chlorhexidine; local anesthetic administered A antimicrobial bonded/coated triple lumen catheter was placed in the left internal jugular vein using the Seldinger technique.  Evaluation Blood flow good Complications: No apparent complications Patient did tolerate procedure well. Chest X-ray ordered to verify placement.  CXR: pending.  Procedure performed by NP Hoffman under my direct supervision  Ultrasound used for site verification, live visualisation of needle entry & guidewire prior to dilation   ALVA,RAKESH V. 06/30/2014, 12:23 PM

## 2014-06-30 NOTE — Progress Notes (Signed)
ANTIBIOTIC CONSULT NOTE - FOLLOW UP  Pharmacy Consult:  Vancomycin / Zosyn Indication:  Sepsis  Allergies  Allergen Reactions  . Simvastatin Other (See Comments)    unknown    Patient Measurements: Height: 6\' 5"  (195.6 cm) Weight: 186 lb 4.6 oz (84.5 kg) IBW/kg (Calculated) : 89.1  Vital Signs: Temp: 97.6 F (36.4 C) (02/16 1145) Temp Source: Oral (02/16 1145) BP: 85/70 mmHg (02/16 1200) Pulse Rate: 61 (02/16 1200) Intake/Output from previous day: 02/15 0701 - 02/16 0700 In: 3375.5 [I.V.:2225.5; IV Piggyback:1150] Out: -  Intake/Output from this shift: Total I/O In: 611.3 [I.V.:161.3; IV Piggyback:450] Out: -   Labs:  Recent Labs  06/28/14 2053 06/28/14 2058 06/29/14 0405 06/29/14 1100 06/30/14 0540 06/30/14 0830  WBC 3.2*  --  5.2  --   --  6.6  HGB 12.7* 15.6 12.4*  --   --  12.4*  PLT 79*  --  73*  --   --  75*  CREATININE 6.17* 5.50* 6.02* 6.42* 6.87*  --    Estimated Creatinine Clearance: 12.5 mL/min (by C-G formula based on Cr of 6.87). No results for input(s): VANCOTROUGH, VANCOPEAK, VANCORANDOM, GENTTROUGH, GENTPEAK, GENTRANDOM, TOBRATROUGH, TOBRAPEAK, TOBRARND, AMIKACINPEAK, AMIKACINTROU, AMIKACIN in the last 72 hours.   Microbiology: Recent Results (from the past 720 hour(s))  MRSA PCR Screening     Status: None   Collection Time: 06/29/14  1:02 AM  Result Value Ref Range Status   MRSA by PCR NEGATIVE NEGATIVE Final    Comment:        The GeneXpert MRSA Assay (FDA approved for NASAL specimens only), is one component of a comprehensive MRSA colonization surveillance program. It is not intended to diagnose MRSA infection nor to guide or monitor treatment for MRSA infections.       Assessment: 26 YOM with ESRD on HD MWF; his last session was last Friday 06/26/14.  Given hemodynamic instability, patient to start CRRT.  He continues on vancomycin and Zosyn for sepsis.  Vanc 2/15 >> Zosyn 2/15 >>  2/15 BCx x2 - 2/15 MRSA PCR -  negative   Goal of Therapy:  Vancomycin trough level 15-20 mcg/ml   Plan:  - Change vanc to 750mg  IV Q24H - Change Zosyn to 3.375gm IV Q6H, infuse over 30 min - Monitor CRRT tolerance/interruption, micro data, vanc level as indicated - F/U ACEi when able, INR and the need for Vit K    Zeanna Sunde D. Mina Marble, PharmD, BCPS Pager:  [phone] 06/30/2014, 1:28 PM

## 2014-06-30 NOTE — Progress Notes (Signed)
Stopped by to see progress, have not been successful with weaning pressors, found out his EF is decreased from baseline- troponin cont to rise.  Patient pulled out his central line so will need to be replaced.  Given the circumstances , they will replace with a vascath and so to not get him any more volume overloaded will go ahead and just place him on CRRT for now until stabilized   Julee Stoll A

## 2014-07-01 DIAGNOSIS — E1159 Type 2 diabetes mellitus with other circulatory complications: Secondary | ICD-10-CM

## 2014-07-01 DIAGNOSIS — I5022 Chronic systolic (congestive) heart failure: Secondary | ICD-10-CM

## 2014-07-01 DIAGNOSIS — Z89512 Acquired absence of left leg below knee: Secondary | ICD-10-CM

## 2014-07-01 DIAGNOSIS — Z89511 Acquired absence of right leg below knee: Secondary | ICD-10-CM

## 2014-07-01 DIAGNOSIS — R570 Cardiogenic shock: Secondary | ICD-10-CM | POA: Insufficient documentation

## 2014-07-01 LAB — RENAL FUNCTION PANEL
Albumin: 2.5 g/dL — ABNORMAL LOW (ref 3.5–5.2)
Albumin: 2.4 g/dL — ABNORMAL LOW (ref 3.5–5.2)
Anion gap: 8 (ref 5–15)
Anion gap: 8 (ref 5–15)
BUN: 19 mg/dL (ref 6–23)
BUN: 17 mg/dL (ref 6–23)
CALCIUM: 8.3 mg/dL — AB (ref 8.4–10.5)
CHLORIDE: 101 mmol/L (ref 96–112)
CO2: 27 mmol/L (ref 19–32)
CO2: 24 mmol/L (ref 19–32)
CREATININE: 4.93 mg/dL — AB (ref 0.50–1.35)
CREATININE: 4.22 mg/dL — AB (ref 0.50–1.35)
Calcium: 8 mg/dL — ABNORMAL LOW (ref 8.4–10.5)
Chloride: 98 mmol/L (ref 96–112)
GFR calc Af Amer: 13 mL/min — ABNORMAL LOW (ref >90)
GFR calc Af Amer: 15 mL/min — ABNORMAL LOW (ref >90)
GFR calc non Af Amer: 11 mL/min — ABNORMAL LOW (ref >90)
GFR, EST NON AFRICAN AMERICAN: 13 mL/min — AB (ref >90)
GLUCOSE: 124 mg/dL — AB (ref 70–99)
Glucose, Bld: 152 mg/dL — ABNORMAL HIGH (ref 70–99)
PHOSPHORUS: 3.4 mg/dL (ref 2.3–4.6)
Phosphorus: 2.5 mg/dL (ref 2.3–4.6)
Potassium: 3.5 mmol/L (ref 3.5–5.1)
Potassium: 3.3 mmol/L — ABNORMAL LOW (ref 3.5–5.1)
Sodium: 133 mmol/L — ABNORMAL LOW (ref 135–145)
Sodium: 133 mmol/L — ABNORMAL LOW (ref 135–145)

## 2014-07-01 LAB — CBC
HCT: 36.2 % — ABNORMAL LOW (ref 39.0–52.0)
Hemoglobin: 11.7 g/dL — ABNORMAL LOW (ref 13.0–17.0)
MCH: 28 pg (ref 26.0–34.0)
MCHC: 32.3 g/dL (ref 30.0–36.0)
MCV: 86.6 fL (ref 78.0–100.0)
PLATELETS: 78 10*3/uL — AB (ref 150–400)
RBC: 4.18 MIL/uL — ABNORMAL LOW (ref 4.22–5.81)
RDW: 21.5 % — AB (ref 11.5–15.5)
WBC: 5.9 10*3/uL (ref 4.0–10.5)

## 2014-07-01 LAB — GLUCOSE, CAPILLARY
GLUCOSE-CAPILLARY: 105 mg/dL — AB (ref 70–99)
GLUCOSE-CAPILLARY: 109 mg/dL — AB (ref 70–99)
GLUCOSE-CAPILLARY: 83 mg/dL (ref 70–99)
GLUCOSE-CAPILLARY: 91 mg/dL (ref 70–99)
Glucose-Capillary: 66 mg/dL — ABNORMAL LOW (ref 70–99)
Glucose-Capillary: 106 mg/dL — ABNORMAL HIGH (ref 70–99)
Glucose-Capillary: 86 mg/dL (ref 70–99)

## 2014-07-01 LAB — APTT: APTT: 52 s — AB (ref 24–37)

## 2014-07-01 LAB — TROPONIN I: Troponin I: 2.35 ng/mL (ref <0.031)

## 2014-07-01 LAB — CLOSTRIDIUM DIFFICILE BY PCR: Toxigenic C. Difficile by PCR: POSITIVE — AB

## 2014-07-01 LAB — PROTIME-INR
INR: 2.26 — AB (ref 0.00–1.49)
Prothrombin Time: 25.1 seconds — ABNORMAL HIGH (ref 11.6–15.2)

## 2014-07-01 LAB — MAGNESIUM: Magnesium: 2.1 mg/dL (ref 1.5–2.5)

## 2014-07-01 LAB — LACTIC ACID, PLASMA: Lactic Acid, Venous: 2 mmol/L (ref 0.5–2.0)

## 2014-07-01 MED ORDER — VANCOMYCIN 50 MG/ML ORAL SOLUTION
500.0000 mg | Freq: Four times a day (QID) | ORAL | Status: DC
Start: 2014-07-01 — End: 2014-07-04
  Administered 2014-07-01 – 2014-07-04 (×10): 500 mg via ORAL
  Filled 2014-07-01 (×16): qty 10

## 2014-07-01 MED ORDER — POTASSIUM CHLORIDE CRYS ER 20 MEQ PO TBCR
40.0000 meq | EXTENDED_RELEASE_TABLET | Freq: Once | ORAL | Status: AC
  Administered 2014-07-01: 40 meq via ORAL
  Filled 2014-07-01: qty 2

## 2014-07-01 MED ORDER — METRONIDAZOLE IN NACL 5-0.79 MG/ML-% IV SOLN
500.0000 mg | Freq: Three times a day (TID) | INTRAVENOUS | Status: DC
  Administered 2014-07-01 – 2014-07-09 (×23): 500 mg via INTRAVENOUS
  Filled 2014-07-01 (×27): qty 100

## 2014-07-01 MED ORDER — NEPRO/CARBSTEADY PO LIQD
237.0000 mL | Freq: Three times a day (TID) | ORAL | Status: DC
  Administered 2014-07-01 – 2014-07-03 (×5): 237 mL via ORAL
  Administered 2014-07-03: 10 mL via ORAL
  Administered 2014-07-04 – 2014-07-05 (×5): 237 mL via ORAL
  Filled 2014-07-01 (×24): qty 237

## 2014-07-01 NOTE — Progress Notes (Signed)
[facility] Progress Note Patient Name: Clayton [name] DOB: 09-07-1945 MRN: [id number]   Date of Service  07/01/2014  HPI/Events of Note  Notified that pt's C diff PCR is positive  eICU Interventions  Will treat as complicated given his renal failure and shock. Flagyl IV + vanco PO started.      Intervention Category Intermediate Interventions: Infection - evaluation and management  Dalaya Suppa S. 07/01/2014, 4:32 PM

## 2014-07-01 NOTE — Progress Notes (Addendum)
PULMONARY / CRITICAL CARE MEDICINE   Name: KEDDRICK SINNER MRN: [id number] DOB: November 09, 1945    ADMISSION DATE:  06/28/2014  REFERRING MD :  EDP   CHIEF COMPLAINT:  Hypotension   INITIAL PRESENTATION: 69 yo male with hx ESRD, CHF, DM, CAD, bilat BKA who receives all his care at [place].  He presented 2/14 after a fall at home with syncope.  He has had diarrhea x 2-3 weeks with negative GI w/u thus far at [facility] (was scheduled for upper/lower scope next week).  In ER was significantly hypotensive with SBP 60's, hypokalemic with K 2.4.  No sig fluid resuscitation given r/t ESRD and CHF.  PCCM called to admit.   STUDIES:  CT head 2/14>>> neg acute  2D echo 2/14>>> LVEF is severely depressed at approximately 20% with hypokinesis of the inferiore wall (base, mid), anterior wall (base, minimally mid)m basal lateral wall;, basal posterior wall; akinesis elsewhere.  SIGNIFICANT EVENTS: 2/14 >> Hypotension s/p 1L fluids - Neo &Dopa started 2/15 >> soft BP persist; On Neo;  2/16 >> Midodrine started by Renal; CRRT started; Neo switched to levo  SUBJECTIVE: Confused. Sleepy.    VITAL SIGNS: Temp:  [96.6 F (35.9 C)-98 F (36.7 C)] 97.4 F (36.3 C) (02/17 0422) Pulse Rate:  [25-85] 25 (02/17 0245) Resp:  [10-31] 18 (02/17 0700) BP: (65-116)/(34-88) 109/55 mmHg (02/17 0700) SpO2:  [85 %-100 %] 100 % (02/17 0000) Weight:  [187 lb 6.3 oz (85 kg)] 187 lb 6.3 oz (85 kg) (02/17 0500) HEMODYNAMICS: CVP:  [21 mmHg-29 mmHg] 24 mmHg VENTILATOR SETTINGS:   INTAKE / OUTPUT:  Intake/Output Summary (Last 24 hours) at 07/01/14 0731 Last data filed at 07/01/14 0700  Gross per 24 hour  Intake 1665.24 ml  Output   1394 ml  Net 271.24 ml    PHYSICAL EXAMINATION: General:  Chronically ill appearing male, NAD Neuro:  A&O; Mumbles inappropriate words. Follows simple commands  HEENT:  Mm dry; no JVD  Cardiovascular:  s1s2 irreg; murmur present  Lungs:  resps even non labored  Abdomen:  Soft, +bs, non  tender  Musculoskeletal:  bilat BKA   LABS:  CBC  Recent Labs Lab 06/29/14 0405 06/30/14 0830 07/01/14 0444  WBC 5.2 6.6 5.9  HGB 12.4* 12.4* 11.7*  HCT 38.7* 39.0 36.2*  PLT 73* 75* 78*   Coag's  Recent Labs Lab 06/29/14 0609 06/30/14 0540 07/01/14 0444  APTT  --   --  52*  INR 2.38* 2.47* 2.26*   BMET  Recent Labs Lab 06/30/14 0540 06/30/14 1500 07/01/14 0444  NA 138 137 133*  K 3.2* 3.6 3.5  CL 99 101 98  CO2 19 20 27   BUN 26* 25* 19  CREATININE 6.87* 6.72* 4.93*  GLUCOSE 99 85 124*   Electrolytes  Recent Labs Lab 06/28/14 2350 06/29/14 0405  06/30/14 0540 06/30/14 1500 07/01/14 0444  CALCIUM  --  8.7  < > 8.7 8.5 8.3*  MG 2.0 1.9  --   --   --  2.1  PHOS 5.1* 5.3*  --  5.6* 5.6* 3.4  < > = values in this interval not displayed. Sepsis Markers  Recent Labs Lab 06/29/14 1629 06/30/14 0830 07/01/14 0447  LATICACIDVEN 2.7* 3.9* 2.0   ABG No results for input(s): PHART, PCO2ART, PO2ART in the last 168 hours. Liver Enzymes  Recent Labs Lab 06/28/14 2053  06/30/14 0830 06/30/14 1500 07/01/14 0444  AST 31  --  32  --   --   ALT  10  --  13  --   --   ALKPHOS 123*  --  106  --   --   BILITOT 1.5*  --  1.8*  --   --   ALBUMIN 2.9*  < > 2.5* 2.5* 2.5*  < > = values in this interval not displayed. Cardiac Enzymes  Recent Labs Lab 06/29/14 1100 06/30/14 0540 07/01/14 0444  TROPONINI 3.16* 4.13* 2.35*   Glucose  Recent Labs Lab 06/30/14 1138 06/30/14 1711 06/30/14 1843 06/30/14 2014 07/01/14 0021 07/01/14 0420  GLUCAP 80 62* 66* 116* 109* 106*    Imaging Dg Chest Port 1 View  06/30/2014   CLINICAL DATA:  Left-sided hemodialysis catheter placement  EXAM: PORTABLE CHEST - 1 VIEW  COMPARISON:  06/29/2014  FINDINGS: New left IJ approach dual lumen dialysis catheter has been placed with tip over the mid SVC. No pneumothorax. Right IJ line has been removed. No significant change otherwise. Gaseous gastric distention is partly  visualized.  IMPRESSION: Support apparatus as above.   Electronically Signed   By: Conchita Paris M.D.   On: 06/30/2014 14:25    ASSESSMENT / PLAN:  PULMONARY Pulmonary HTN CXR w/o acute process  P:   Supplemental O2 as needed   CARDIOVASCULAR CVL  2/14>>> CAD; HFrEF (20%)  DM Hypotension likely 2/2 hypovolemia in setting diarrhea and end stage HF Low BP at baseline HTN  Pacemaker/ AICD NSTEMI  P:  Troponin: NSTEMI trending down- Consulted Cardiology = medical management. Unstable for cath EKG: No acute changes KVO fluids  Following lactate - Decreasing on Levo  Midodrine started 2/16 Switch Neo to Levo 04/29/14 CVP: elevated 24 (2/17) - Removing fluid by CRRT as able  Palliative care consult  RENAL ESRD - MWF HD - did get full HD Friday without difficulty  Hypokalemia  P:   Replete K - 4 runs 2/16 Renal following; CRRT started ~ 800 cc fluid off in last 24hrs  GASTROINTESTINAL Diarrhea - ongoing x 3weeks.  Denies abd pain, nausea. P:   pepcid  zofran PRN  Consider GI input - hold off for now until hemodynamically stable  Renal diet  HEMATOLOGIC Thrombocytopenia  Leukopenia - Resolved  P:  F/u CBC  No current DVT prophylaxis given plts low and bilateral BKA's and INR current 2.26 s/p Vit K  INFECTIOUS ?enteritis  P:   BCx >> NGTD Vanc 2/15 >> 2/17 day 3/3 Zosyn 2/15 >>2/17  Day 3/3  ENDOCRINE DM  P:   SSI  NEUROLOGIC Syncope - suspect r/t significant hypotension/ volume depletion- Resolved CT head negative P:   Supportive care    FAMILY  - Updates: updated at bedside 2/15  - Inter-disciplinary family meet or Palliative Care meeting due by:  2/21  Olam Idler, MD 07/01/2014, 7:31 AM PGY-2, [place]

## 2014-07-01 NOTE — Progress Notes (Signed)
Subjective:  CRRT was initiated yesterday- running well. Norepi dose is overall down- Ins and outs even last 24 hours with CRRT Objective Vital signs in last 24 hours: Filed Vitals:   07/01/14 0515 07/01/14 0530 07/01/14 0545 07/01/14 0600  BP: 94/52 82/58 85/52  102/61  Pulse:      Temp:      TempSrc:      Resp: 19 16 18 19   Height:      Weight:      SpO2:       Weight change: 3.4 kg (7 lb 7.9 oz)  Intake/Output Summary (Last 24 hours) at 07/01/14 [id number] Last data filed at 07/01/14 0600  Gross per 24 hour  Intake 1738.94 ml  Output   1279 ml  Net 459.94 ml   Assessment/Plan: 69 year old BM with multiple medical issues including ESRD MWF at [facility] presents with diarrhea/syncope 1 syncope/hypotension-  concern for cardiogenic hypotension with new decreased EF given  elevated troponin- cards on board, vs sepsis on broad spectrum antibiotics. Supportive care initially with  volume but no success getting him off pressors, now CRRT.   Pressor dose overall down.  Hopeful will eventually be able to come off pressors otherwise not sure what end point might be. 2 ESRD: normally MWF via AVF - no regular HD since Friday- CRRT started 2/16 via vascath - no heparin , all dialysate- running well 3. Anemia of ESRD: this does not seem to be [name] significant issue for the patient , he has not needed ESA as OP 4. Metabolic Bone Disease: continue home meds of hectorol 2, sensipar 30 daily.  Normally on  renagel/renvela- his phos was OK, hold binders for now given GI issues 5. Hypokalemia- repleted and 4 K bath 6. Diarrhea- pt says is better   Clayton [name]    Labs: Basic Metabolic Panel:  Recent Labs Lab 06/30/14 0540 06/30/14 1500 07/01/14 0444  NA 138 137 133*  K 3.2* 3.6 3.5  CL 99 101 98  CO2 19 20 27   GLUCOSE 99 85 124*  BUN 26* 25* 19  CREATININE 6.87* 6.72* 4.93*  CALCIUM 8.7 8.5 8.3*  PHOS 5.6* 5.6* 3.4   Liver Function Tests:  Recent Labs Lab 06/28/14 2053   06/30/14 0830 06/30/14 1500 07/01/14 0444  AST 31  --  32  --   --   ALT 10  --  13  --   --   ALKPHOS 123*  --  106  --   --   BILITOT 1.5*  --  1.8*  --   --   PROT 7.5  --  6.5  --   --   ALBUMIN 2.9*  < > 2.5* 2.5* 2.5*  < > = values in this interval not displayed.  Recent Labs Lab 06/28/14 2053 06/28/14 2350  LIPASE 25  --   AMYLASE  --  47   No results for input(s): AMMONIA in the last 168 hours. CBC:  Recent Labs Lab 06/28/14 2053  06/29/14 0405 06/30/14 0830 07/01/14 0444  WBC 3.2*  --  5.2 6.6 5.9  NEUTROABS 2.2  --   --  4.7  --   HGB 12.7*  < > 12.4* 12.4* 11.7*  HCT 40.1  < > 38.7* 39.0 36.2*  MCV 89.7  --  89.6 88.2 86.6  PLT 79*  --  73* 75* 78*  < > = values in this interval not displayed. Cardiac Enzymes:  Recent Labs Lab 06/28/14 2350 06/29/14 0405  06/29/14 1100 06/30/14 0540 07/01/14 0444  TROPONINI 0.47* 1.16* 3.16* 4.13* 2.35*   CBG:  Recent Labs Lab 06/30/14 1711 06/30/14 1843 06/30/14 2014 07/01/14 0021 07/01/14 0420  GLUCAP 62* 66* 116* 109* 106*    Iron Studies: No results for input(s): IRON, TIBC, TRANSFERRIN, FERRITIN in the last 72 hours. Studies/Results: Dg Chest Port 1 View  06/30/2014   CLINICAL DATA:  Left-sided hemodialysis catheter placement  EXAM: PORTABLE CHEST - 1 VIEW  COMPARISON:  06/29/2014  FINDINGS: New left IJ approach dual lumen dialysis catheter has been placed with tip over the mid SVC. No pneumothorax. Right IJ line has been removed. No significant change otherwise. Gaseous gastric distention is partly visualized.  IMPRESSION: Support apparatus as above.   Electronically Signed   By: Conchita Paris M.D.   On: 06/30/2014 14:25   Medications: Infusions: . dextrose 5 % and 0.9% NaCl 10 mL/hr at 06/30/14 2200  . norepinephrine (LEVOPHED) Adult infusion 12 mcg/min (07/01/14 0546)  . dialysis replacement fluid (prismasate) 300 mL/hr at 06/30/14 1449  . dialysis replacement fluid (prismasate) 300 mL/hr at  06/30/14 1448  . dialysate (PRISMASATE) 1,000 mL/hr at 07/01/14 0135    Scheduled Medications: . antiseptic oral rinse  7 mL Mouth Rinse BID  . cinacalcet  30 mg Oral Q breakfast  . doxercalciferol  2 mcg Intravenous Q M,W,F-HD  . famotidine (PEPCID) IV  20 mg Intravenous Q24H  . insulin aspart  0-9 Units Subcutaneous TID WC  . midodrine  10 mg Oral BID WC  . piperacillin-tazobactam  3.375 g Intravenous 4 times per day  . vancomycin  750 mg Intravenous Q24H    have reviewed scheduled and prn medications.  Physical Exam: General: resting- left neck vascath placed 2/16 Heart: RRR Lungs: mostly clear- but with some decreased BS at bases Abdomen: soft, non tender Extremities: dependent pitting edema Dialysis Access: left AVF - patent    07/01/2014,6:35 AM  LOS: 3 days

## 2014-07-01 NOTE — Progress Notes (Addendum)
SUBJECTIVE:  Somewhat confused.  OBJECTIVE:   Vitals:   Filed Vitals:   07/01/14 1218 07/01/14 1230 07/01/14 1245 07/01/14 1300  BP:  75/45 80/53 96/56   Pulse:  59 58   Temp: 97 F (36.1 C)     TempSrc: Oral     Resp:  19 15 18   Height:      Weight:      SpO2:       I&O's:   Intake/Output Summary (Last 24 hours) at 07/01/14 1312 Last data filed at 07/01/14 1300  Gross per 24 hour  Intake 1077.04 ml  Output   1806 ml  Net -728.96 ml   TELEMETRY: Reviewed telemetry intermittent pacing     PHYSICAL EXAM General: Frail Head:   Normal cephalic and atramatic  Lungs:  No wheezing. Heart:   HRRR S1 S2  .   Abdomen: abdomen soft and non-tender Msk:  Back normal,  Normal strength and tone for age. Extremities:   No edema.   Neuro: Alert and oriented. Psych:  Normal affect, responds appropriately Skin: No rash   LABS: Basic Metabolic Panel:  Recent Labs  06/29/14 0405  06/30/14 1500 07/01/14 0444  NA 139  < > 137 133*  K 3.1*  < > 3.6 3.5  CL 100  < > 101 98  CO2 31  < > 20 27  GLUCOSE 102*  < > 85 124*  BUN 20  < > 25* 19  CREATININE 6.02*  < > 6.72* 4.93*  CALCIUM 8.7  < > 8.5 8.3*  MG 1.9  --   --  2.1  PHOS 5.3*  < > 5.6* 3.4  < > = values in this interval not displayed. Liver Function Tests:  Recent Labs  06/28/14 2053  06/30/14 0830 06/30/14 1500 07/01/14 0444  AST 31  --  32  --   --   ALT 10  --  13  --   --   ALKPHOS 123*  --  106  --   --   BILITOT 1.5*  --  1.8*  --   --   PROT 7.5  --  6.5  --   --   ALBUMIN 2.9*  < > 2.5* 2.5* 2.5*  < > = values in this interval not displayed.  Recent Labs  06/28/14 2053 06/28/14 2350  LIPASE 25  --   AMYLASE  --  47   CBC:  Recent Labs  06/28/14 2053  06/30/14 0830 07/01/14 0444  WBC 3.2*  < > 6.6 5.9  NEUTROABS 2.2  --  4.7  --   HGB 12.7*  < > 12.4* 11.7*  HCT 40.1  < > 39.0 36.2*  MCV 89.7  < > 88.2 86.6  PLT 79*  < > 75* 78*  < > = values in this interval not  displayed. Cardiac Enzymes:  Recent Labs  06/29/14 1100 06/30/14 0540 07/01/14 0444  TROPONINI 3.16* 4.13* 2.35*   BNP: Invalid input(s): POCBNP D-Dimer: No results for input(s): DDIMER in the last 72 hours. Hemoglobin A1C: No results for input(s): HGBA1C in the last 72 hours. Fasting Lipid Panel: No results for input(s): CHOL, HDL, LDLCALC, TRIG, CHOLHDL, LDLDIRECT in the last 72 hours. Thyroid Function Tests: No results for input(s): TSH, T4TOTAL, T3FREE, THYROIDAB in the last 72 hours.  Invalid input(s): FREET3 Anemia Panel: No results for input(s): VITAMINB12, FOLATE, FERRITIN, TIBC, IRON, RETICCTPCT in the last 72 hours. Coag Panel:   Lab Results  Component Value  Date   INR 2.26* 07/01/2014   INR 2.47* 06/30/2014   INR 2.38* 06/29/2014    RADIOLOGY: Dg Chest 2 View  06/28/2014   CLINICAL DATA:  Syncope  EXAM: CHEST  2 VIEW  COMPARISON:  03/25/2010  FINDINGS: There are intact appearances of the transvenous leads. There is unchanged moderate cardiomegaly. There is mild interstitial thickening and central vascular congestion which is new from 2011. This likely represents a degree of congestive heart failure. No confluent alveolar opacities are evident. No effusions are evident.  IMPRESSION: Probable mild congestive heart failure.   Electronically Signed   By: Andreas Newport M.D.   On: 06/28/2014 22:01   Ct Head Wo Contrast  06/28/2014   CLINICAL DATA:  Two episodes of syncope today  EXAM: CT HEAD WITHOUT CONTRAST  TECHNIQUE: Contiguous axial images were obtained from the base of the skull through the vertex without intravenous contrast.  COMPARISON:  None.  FINDINGS: The ventricles are normal in size and configuration. There is no intracranial mass, hemorrhage, extra-axial fluid collection, or midline shift. There is slight small vessel disease in the centra semiovale bilaterally. Elsewhere, gray-white compartments appear normal. No acute infarct apparent. The bony calvarium  appears intact. The mastoid air cells are clear. There is a small air-fluid level in the right maxillary antrum with a a retention cyst along the anterior inferior right maxillary antrum. There is rightward deviation of the nasal septum.  IMPRESSION: Mild periventricular small vessel disease. No intracranial mass, hemorrhage, or acute appearing infarct. Evidence of right maxillary sinus disease.   Electronically Signed   By: Lowella Grip III M.D.   On: 06/28/2014 21:30   Dg Chest Port 1 View  06/30/2014   CLINICAL DATA:  Left-sided hemodialysis catheter placement  EXAM: PORTABLE CHEST - 1 VIEW  COMPARISON:  06/29/2014  FINDINGS: New left IJ approach dual lumen dialysis catheter has been placed with tip over the mid SVC. No pneumothorax. Right IJ line has been removed. No significant change otherwise. Gaseous gastric distention is partly visualized.  IMPRESSION: Support apparatus as above.   Electronically Signed   By: Conchita Paris M.D.   On: 06/30/2014 14:25   Dg Chest Portable 1 View  06/29/2014   CLINICAL DATA:  Central line placement.  Subsequent evaluation.  EXAM: PORTABLE CHEST - 1 VIEW  COMPARISON:  Chest radiograph June 28, 2014  FINDINGS: The cardiac silhouette appears at least mild apparently enlarged coming with considerations low inspiratory examination. Cauda vascular markings without pleural effusion or focal consolidation. Calcified aortic knob. Strandy densities LEFT lung base.  Interval placement RIGHT internal jugular central venous catheter with distal tip projecting cavoatrial junction. No pneumothorax. Four lead LEFT cardiac defibrillator in situ. Soft tissue planes and included osseous structures are nonsuspicious. Moderate degenerative change of the thoracic spine.  IMPRESSION: RIGHT internal jugular central venous catheter distal tip projects cavoatrial junction. No pneumothorax.  Cardiomegaly.  LEFT lung base atelectasis/scar.   Electronically Signed   By: Elon Alas    On: 06/29/2014 02:05      ASSESSMENT: Kathyrn Lass:    1) NSTEMI: High risk for CAD with PAD and ESRD.  Ischemic cardiomyopathy, low EF 20%.  He is still hypotensive on CVVHD requiring pressors.  Not currently a candidate for cath.  He is confused; does not remember the details of his pacer.  Unsure where his pacer was placed.  Unclear reason why his INR is elevated.  He is not on anticoagulation.   2) Still requiring some Levophed  at low dose.  3) Palliative care apparently being consulted for goals of care.      Jettie Booze, MD  07/01/2014  1:12 PM

## 2014-07-01 NOTE — Progress Notes (Signed)
CRRT filter changed.

## 2014-07-01 NOTE — Progress Notes (Addendum)
INITIAL NUTRITION ASSESSMENT  DOCUMENTATION CODES Per approved criteria  Moderate malnutrition in the context of chronic illness   Pt meets criteria for moderate MALNUTRITION in the context of chronic illness as evidenced by mild-moderate depletion of muscle mass and intake </= 75% of estimated energy requirement for >/= 1 month.  INTERVENTION:  Nepro Shake PO TID, each supplement provides 425 kcal and 19 grams protein  NUTRITION DIAGNOSIS: Inadequate oral intake related to poor appetite as evidenced by poor intake of meals.   Goal: Intake to meet >90% of estimated nutrition needs.  Monitor:  PO intake, labs, weight trend.  Reason for Assessment: Malnutrition Screening Tool; Poor PO intake  69 y.o. male  Admitting Dx: Severe sepsis with septic shock  ASSESSMENT: Patient admitted on 2/14 after a fall at home with syncope. Hx of ESRD on HD, bilateral BKA.  Spoke with patient's family member in room with him. She reports that he has been eating very poorly (less than half of his usual intake) over the past 2 months. Discussed patient in ICU rounds and with RN today. Per RN's discussion with patient yesterday, he has had a poor appetite for a while. Intake of meals is poor.   Nutrition Focused Physical Exam:  Subcutaneous Fat:  Orbital Region: WNL Upper Arm Region: WNL Thoracic and Lumbar Region: NA  Muscle:  Temple Region: mild depletion Clavicle Bone Region: moderate depletion Clavicle and Acromion Bone Region: moderate depletion Scapular Bone Region: NA Dorsal Hand: WNL Patellar Region: mild depletion Anterior Thigh Region: mild depletion Posterior Calf Region: N/A  Edema: N/A   Height: Ht Readings from Last 1 Encounters:  06/28/14 6\' 5"  (1.956 m)    Weight: Wt Readings from Last 1 Encounters:  07/01/14 187 lb 6.3 oz (85 kg)    Ideal Body Weight: 83.4 kg  % Ideal Body Weight: 102%  Wt Readings from Last 10 Encounters:  07/01/14 187 lb 6.3 oz (85 kg)     Usual Body Weight: unknown  % Usual Body Weight: N/A  BMI:  25.2  Estimated Nutritional Needs: Kcal: 2500-2700 Protein: 110-125 gm Fluid: 1.2 L  Skin: stage 2 pressure ulcer to sacrum  Diet Order: Diet renal W/1227mL fluid restriction  EDUCATION NEEDS: -Education not appropriate at this time   Intake/Output Summary (Last 24 hours) at 07/01/14 1201 Last data filed at 07/01/14 1100  Gross per 24 hour  Intake 1055.84 ml  Output   1685 ml  Net -629.16 ml    Last BM: 2/16   Labs:   Recent Labs Lab 06/28/14 2350 06/29/14 0405  06/30/14 0540 06/30/14 1500 07/01/14 0444  NA  --  139  < > 138 137 133*  K  --  3.1*  < > 3.2* 3.6 3.5  CL  --  100  < > 99 101 98  CO2  --  31  < > 19 20 27   BUN  --  20  < > 26* 25* 19  CREATININE  --  6.02*  < > 6.87* 6.72* 4.93*  CALCIUM  --  8.7  < > 8.7 8.5 8.3*  MG 2.0 1.9  --   --   --  2.1  PHOS 5.1* 5.3*  --  5.6* 5.6* 3.4  GLUCOSE  --  102*  < > 99 85 124*  < > = values in this interval not displayed.  CBG (last 3)   Recent Labs  07/01/14 0021 07/01/14 0420 07/01/14 0805  GLUCAP 109* 106* 83  Scheduled Meds: . antiseptic oral rinse  7 mL Mouth Rinse BID  . cinacalcet  30 mg Oral Q breakfast  . doxercalciferol  2 mcg Intravenous Q M,W,F-HD  . famotidine (PEPCID) IV  20 mg Intravenous Q24H  . insulin aspart  0-9 Units Subcutaneous TID WC  . midodrine  10 mg Oral BID WC    Continuous Infusions: . dextrose 5 % and 0.9% NaCl 10 mL/hr at 06/30/14 2200  . norepinephrine (LEVOPHED) Adult infusion 4 mcg/min (07/01/14 1100)  . dialysis replacement fluid (prismasate) 300 mL/hr at 07/01/14 0801  . dialysis replacement fluid (prismasate) 300 mL/hr at 07/01/14 0756  . dialysate (PRISMASATE) 1,000 mL/hr at 07/01/14 0135    Past Medical History  Diagnosis Date  . Renal disorder   . CHF (congestive heart failure)   . Diabetes mellitus without complication   . Coronary artery disease   . S/P bilateral BKA (below  knee amputation)     Past Surgical History  Procedure Laterality Date  . Below knee leg amputation Bilateral   . Cardiac catheterization  1998/1999    stents both times, no other details available  . Cardiac defibrillator placement  2010    [employer]    Molli Barrows, [place], [place], [place] Pager [phone] After Hours Pager [phone]

## 2014-07-02 ENCOUNTER — Inpatient Hospital Stay (HOSPITAL_COMMUNITY): Payer: Medicare Other

## 2014-07-02 DIAGNOSIS — A0472 Enterocolitis due to Clostridium difficile, not specified as recurrent: Secondary | ICD-10-CM | POA: Insufficient documentation

## 2014-07-02 DIAGNOSIS — G9340 Encephalopathy, unspecified: Secondary | ICD-10-CM | POA: Insufficient documentation

## 2014-07-02 DIAGNOSIS — Z515 Encounter for palliative care: Secondary | ICD-10-CM

## 2014-07-02 DIAGNOSIS — E440 Moderate protein-calorie malnutrition: Secondary | ICD-10-CM | POA: Insufficient documentation

## 2014-07-02 DIAGNOSIS — E1351 Other specified diabetes mellitus with diabetic peripheral angiopathy without gangrene: Secondary | ICD-10-CM

## 2014-07-02 DIAGNOSIS — A047 Enterocolitis due to Clostridium difficile: Secondary | ICD-10-CM

## 2014-07-02 LAB — RENAL FUNCTION PANEL
ALBUMIN: 2.4 g/dL — AB (ref 3.5–5.2)
ANION GAP: 10 (ref 5–15)
Albumin: 2.5 g/dL — ABNORMAL LOW (ref 3.5–5.2)
Anion gap: 6 (ref 5–15)
BUN: 14 mg/dL (ref 6–23)
BUN: 14 mg/dL (ref 6–23)
CALCIUM: 7.9 mg/dL — AB (ref 8.4–10.5)
CHLORIDE: 103 mmol/L (ref 96–112)
CO2: 26 mmol/L (ref 19–32)
CO2: 24 mmol/L (ref 19–32)
Calcium: 8 mg/dL — ABNORMAL LOW (ref 8.4–10.5)
Chloride: 101 mmol/L (ref 96–112)
Creatinine, Ser: 3.42 mg/dL — ABNORMAL HIGH (ref 0.50–1.35)
Creatinine, Ser: 3.41 mg/dL — ABNORMAL HIGH (ref 0.50–1.35)
GFR calc Af Amer: 20 mL/min — ABNORMAL LOW (ref >90)
GFR calc non Af Amer: 17 mL/min — ABNORMAL LOW (ref >90)
GFR, EST AFRICAN AMERICAN: 20 mL/min — AB (ref >90)
GFR, EST NON AFRICAN AMERICAN: 17 mL/min — AB (ref >90)
Glucose, Bld: 168 mg/dL — ABNORMAL HIGH (ref 70–99)
Glucose, Bld: 127 mg/dL — ABNORMAL HIGH (ref 70–99)
PHOSPHORUS: 2.2 mg/dL — AB (ref 2.3–4.6)
POTASSIUM: 3.5 mmol/L (ref 3.5–5.1)
Phosphorus: 1.7 mg/dL — ABNORMAL LOW (ref 2.3–4.6)
Potassium: 3.4 mmol/L — ABNORMAL LOW (ref 3.5–5.1)
SODIUM: 135 mmol/L (ref 135–145)
Sodium: 135 mmol/L (ref 135–145)

## 2014-07-02 LAB — GLUCOSE, CAPILLARY
GLUCOSE-CAPILLARY: 85 mg/dL (ref 70–99)
GLUCOSE-CAPILLARY: 158 mg/dL — AB (ref 70–99)
Glucose-Capillary: 107 mg/dL — ABNORMAL HIGH (ref 70–99)
Glucose-Capillary: 94 mg/dL (ref 70–99)

## 2014-07-02 LAB — MAGNESIUM: Magnesium: 2.1 mg/dL (ref 1.5–2.5)

## 2014-07-02 LAB — APTT: aPTT: 55 seconds — ABNORMAL HIGH (ref 24–37)

## 2014-07-02 MED ORDER — DEXTROSE 5 % IV SOLN
20.0000 mmol | Freq: Once | INTRAVENOUS | Status: AC
  Administered 2014-07-02: 20 mmol via INTRAVENOUS
  Filled 2014-07-02: qty 6.67

## 2014-07-02 MED ORDER — PANTOPRAZOLE SODIUM 40 MG PO TBEC
40.0000 mg | DELAYED_RELEASE_TABLET | Freq: Every day | ORAL | Status: DC
Start: 2014-07-02 — End: 2014-07-02

## 2014-07-02 NOTE — Consult Note (Signed)
Patient Clayton [name]      DOB: 1945/12/06      [id number]     Consult Note from the Palliative Medicine Team at [place] Requested by: Dr Berkley Harvey     PCP: Jan Fireman Reason for [place]    Phone [phone]  Assessment/Recommendations: 69 yo male with multiple medical problems who presented with several week history of diarrhea, syncopal episode. Found to have NSTEMI, persistent hypotension, c-diff  1.  Code Status: Full, I did not discuss today  2. GOC: Clayton [name] remains encephalopathic but more alert today per nursing. I spoke with his wife Barbaraann Rondo over phone today.  She has heard from Cardiology about issues going forward with blood pressure. We discussed ongoing issues as well as recently discovered c-diff.  She is hopeful that treatment of c-diff will help BP and understands that if not, his HF likely driving hypotension and we may be talking care at end of his life in that scenario. She is aware of this and understands he has multiple medical issues which have always made his care challenging. I have encouraged her to call me if additional questions/concerns or needs of support.  I would encourage ICU resident team to keep in regular contact with family to let them know of progress or lack their of.  I will follow peripherally, but would be happy to help further if questions arise.   If not improving and moving to comfort care, will need Cardiology called to deactivate ICD.   3. Symptom Management:   1. Encephalopathy- multifactorial with NSTEMI/CHF, sepsis, hypotension.  Avoid sedating meds/benzo's.  Re-orient daily. Avoid restraints if able.  Would keep blinds open during day.   4. Psychosocial/Spiritual: Clayton [name] almost all his care at [facility]. Wife prefers transfer there where physicians know him but understands he is too sick to safely do so. Lives in [place].    Brief HPI: 70 yo male with ESRD, DM, PVD s/p bilat lower  ext amputation, CAD/CHF s/p ICD.  He was admitted on 2/14 with several week history of diarrhea and syncopal episode. Due to patient encephalopathy history obtained from wife and chart. He receives almost all his care at [facility]. Was reportedly being worked up for diarrhea there and wife estimates this issue has been going on for more than a month. On admission, he was noted to be severely hypotensive, electrolyte abnormalities, elevated troponin c/w NSTEMI.  Echo cardiogram revealed severe BiV heart failure.  He was admitted to ICU and started on CVVH along with pressors.  He has had persistent hypotension despite addition of midodrine and continued pressor requirements. Yesterday found to have + C-diff PCR and started on oral vanc and IV flagyl. Palliative care consulted to assist with goals of care. Cardiology feels he likely has ICM but not candidate for cath or other cardiac interventions at this point.      PMH:  Past Medical History  Diagnosis Date  . Renal disorder   . CHF (congestive heart failure)   . Diabetes mellitus without complication   . Coronary artery disease   . S/P bilateral BKA (below knee amputation)      PSH: Past Surgical History  Procedure Laterality Date  . Below knee leg amputation Bilateral   . Cardiac catheterization  1998/1999    stents both times, no other details available  . Cardiac defibrillator placement  2010    [employer]   I have reviewed the FH and SH and  If  appropriate update it with new information. Allergies  Allergen Reactions  . Simvastatin Other (See Comments)    unknown   Scheduled Meds: . antiseptic oral rinse  7 mL Mouth Rinse BID  . cinacalcet  30 mg Oral Q breakfast  . doxercalciferol  2 mcg Intravenous Q M,W,F-HD  . famotidine (PEPCID) IV  20 mg Intravenous Q24H  . feeding supplement (NEPRO CARB STEADY)  237 mL Oral TID BM  . insulin aspart  0-9 Units Subcutaneous TID WC  . vancomycin  500 mg Oral 4 times per day   And  .  metronidazole  500 mg Intravenous Q8H  . midodrine  10 mg Oral BID WC   Continuous Infusions: . dextrose 5 % and 0.9% NaCl 10 mL/hr at 07/02/14 0929  . norepinephrine (LEVOPHED) Adult infusion 6 mcg/min (07/02/14 1400)  . dialysis replacement fluid (prismasate) 300 mL/hr at 07/02/14 0200  . dialysis replacement fluid (prismasate) 300 mL/hr at 07/02/14 0200  . dialysate (PRISMASATE) 1,000 mL/hr at 07/02/14 1154   PRN Meds:.heparin, heparin, ondansetron (ZOFRAN) IV    BP 86/52 mmHg  Pulse 51  Temp(Src) 97 F (36.1 C) (Oral)  Resp 22  Ht 6\' 5"  (1.956 m)  Wt 83.1 kg (183 lb 3.2 oz)  BMI 21.72 kg/m2  SpO2 99%   PPS: 20   Intake/Output Summary (Last 24 hours) at 07/02/14 1458 Last data filed at 07/02/14 1400  Gross per 24 hour  Intake 1481.6 ml  Output   2086 ml  Net -604.4 ml    Physical Exam:  General: Alert, confused HEENT:  [place]iba, mmm Chest:  CTAB CVS: brady Abdomen: soft, ND Ext: bilateral lower ext amputations Neuro: oriented to person and place only.    Labs: CBC    Component Value Date/Time   WBC 5.9 07/01/2014 0444   RBC 4.18* 07/01/2014 0444   HGB 11.7* 07/01/2014 0444   HCT 36.2* 07/01/2014 0444   PLT 78* 07/01/2014 0444   MCV 86.6 07/01/2014 0444   MCH 28.0 07/01/2014 0444   MCHC 32.3 07/01/2014 0444   RDW 21.5* 07/01/2014 0444   LYMPHSABS 0.9 06/30/2014 0830   MONOABS 0.9 06/30/2014 0830   EOSABS 0.0 06/30/2014 0830   BASOSABS 0.1 06/30/2014 0830    BMET    Component Value Date/Time   NA 135 07/02/2014 0500   K 3.4* 07/02/2014 0500   CL 103 07/02/2014 0500   CO2 26 07/02/2014 0500   GLUCOSE 168* 07/02/2014 0500   BUN 14 07/02/2014 0500   CREATININE 3.42* 07/02/2014 0500   CALCIUM 8.0* 07/02/2014 0500   GFRNONAA 17* 07/02/2014 0500   GFRAA 20* 07/02/2014 0500    CMP     Component Value Date/Time   NA 135 07/02/2014 0500   K 3.4* 07/02/2014 0500   CL 103 07/02/2014 0500   CO2 26 07/02/2014 0500   GLUCOSE 168* 07/02/2014 0500    BUN 14 07/02/2014 0500   CREATININE 3.42* 07/02/2014 0500   CALCIUM 8.0* 07/02/2014 0500   PROT 6.5 06/30/2014 0830   ALBUMIN 2.4* 07/02/2014 0500   AST 32 06/30/2014 0830   ALT 13 06/30/2014 0830   ALKPHOS 106 06/30/2014 0830   BILITOT 1.8* 06/30/2014 0830   GFRNONAA 17* 07/02/2014 0500   GFRAA 20* 07/02/2014 0500   2/15 Echo - Left ventricle: LVEF is severely depressed at approximately 20% with hypokinesis of the inferiore wall (base, mid), anterior wall (base, minimally mid)m basal lateral wall;, basal posterior wall; akinesis elsewhere. The cavity size was  moderately dilated. - Left atrium: The atrium was moderately dilated. - Right ventricle: The cavity size was moderately dilated. Systolic function was moderately to severely reduced. - Right atrium: The atrium was moderately to severely dilated.  2/15 CXR IMPRESSION: RIGHT internal jugular central venous catheter distal tip projects cavoatrial junction. No pneumothorax.  Cardiomegaly. LEFT lung base atelectasis/scar.    Total Time: 50 minutes Greater than 50%  of this time was spent counseling and coordinating care related to the above assessment and plan.  Doran Clay D.O. Palliative Medicine Team at [facility]  Pager: [phone] Team Phone: [phone]

## 2014-07-02 NOTE — Progress Notes (Signed)
UR Completed.  [phone]  

## 2014-07-02 NOTE — Progress Notes (Signed)
Subjective:  CRRT running but with some issues- thinking catheter is positional- norepi up- now c diff is positive, put on flagyl and vanc Objective Vital signs in last 24 hours: Filed Vitals:   07/02/14 0545 07/02/14 0600 07/02/14 0615 07/02/14 0630  BP: 91/60 95/63 90/61  94/53  Pulse:  73 80 50  Temp:      TempSrc:      Resp: 23 24 17 17   Height:      Weight:      SpO2:  98% 25%    Weight change: -1.9 kg (-4 lb 3 oz)  Intake/Output Summary (Last 24 hours) at 07/02/14 [id number] Last data filed at 07/02/14 0600  Gross per 24 hour  Intake  963.3 ml  Output   1806 ml  Net -842.7 ml   Assessment/Plan: 69 year old BM with multiple medical issues including ESRD MWF at [facility] presents with diarrhea/syncope 1 syncope/hypotension-  concern for cardiogenic hypotension with new decreased EF and elevated troponin- cards on board, vs sepsis on broad spectrum antibiotics. Supportive care initially with  volume but no success getting him off pressors, now CRRT.   Pressor dose stable to up.  Hopeful will eventually be able to come off pressors otherwise not sure what end point might be.  Palliative care has been consulted- using midodrine 2 ESRD: normally MWF via AVF - no regular HD since Friday- CRRT started 2/16 via vascath - no heparin , all dialysate- running pretty well- no need to initiate heparin it seems at this time 3. Anemia of ESRD: this does not seem to be a significant issue for the patient , he has not needed ESA as OP 4. Metabolic Bone Disease: continue home meds of hectorol 2, sensipar 30 daily.  Normally on  renagel/renvela- his phos is low, no binders, will replete 5. Hypokalemia- repleted and 4 K bath 6. Diarrhea- C diff positive- vanc and flagyl- not sure how much is contributing to overall picture as is something that should improve with treatment   Namira Rosekrans A    Labs: Basic Metabolic Panel:  Recent Labs Lab 07/01/14 0444 07/01/14 1510 07/02/14 0500  NA  133* 133* 135  K 3.5 3.3* 3.4*  CL 98 101 103  CO2 27 24 26   GLUCOSE 124* 152* 168*  BUN 19 17 14   CREATININE 4.93* 4.22* 3.42*  CALCIUM 8.3* 8.0* 8.0*  PHOS 3.4 2.5 1.7*   Liver Function Tests:  Recent Labs Lab 06/28/14 2053  06/30/14 0830  07/01/14 0444 07/01/14 1510 07/02/14 0500  AST 31  --  32  --   --   --   --   ALT 10  --  13  --   --   --   --   ALKPHOS 123*  --  106  --   --   --   --   BILITOT 1.5*  --  1.8*  --   --   --   --   PROT 7.5  --  6.5  --   --   --   --   ALBUMIN 2.9*  < > 2.5*  < > 2.5* 2.4* 2.4*  < > = values in this interval not displayed.  Recent Labs Lab 06/28/14 2053 06/28/14 2350  LIPASE 25  --   AMYLASE  --  47   No results for input(s): AMMONIA in the last 168 hours. CBC:  Recent Labs Lab 06/28/14 2053  06/29/14 0405 06/30/14 0830 07/01/14 0444  WBC 3.2*  --  5.2 6.6 5.9  NEUTROABS 2.2  --   --  4.7  --   HGB 12.7*  < > 12.4* 12.4* 11.7*  HCT 40.1  < > 38.7* 39.0 36.2*  MCV 89.7  --  89.6 88.2 86.6  PLT 79*  --  73* 75* 78*  < > = values in this interval not displayed. Cardiac Enzymes:  Recent Labs Lab 06/28/14 2350 06/29/14 0405 06/29/14 1100 06/30/14 0540 07/01/14 0444  TROPONINI 0.47* 1.16* 3.16* 4.13* 2.35*   CBG:  Recent Labs Lab 07/01/14 0420 07/01/14 0805 07/01/14 1213 07/01/14 1557 07/01/14 1949  GLUCAP 106* 83 86 91 105*    Iron Studies: No results for input(s): IRON, TIBC, TRANSFERRIN, FERRITIN in the last 72 hours. Studies/Results: Dg Chest Port 1 View  06/30/2014   CLINICAL DATA:  Left-sided hemodialysis catheter placement  EXAM: PORTABLE CHEST - 1 VIEW  COMPARISON:  06/29/2014  FINDINGS: New left IJ approach dual lumen dialysis catheter has been placed with tip over the mid SVC. No pneumothorax. Right IJ line has been removed. No significant change otherwise. Gaseous gastric distention is partly visualized.  IMPRESSION: Support apparatus as above.   Electronically Signed   By: Conchita Paris M.D.    On: 06/30/2014 14:25   Medications: Infusions: . dextrose 5 % and 0.9% NaCl 10 mL/hr at 07/02/14 0600  . norepinephrine (LEVOPHED) Adult infusion 11 mcg/min (07/02/14 0600)  . dialysis replacement fluid (prismasate) 300 mL/hr at 07/02/14 0200  . dialysis replacement fluid (prismasate) 300 mL/hr at 07/02/14 0200  . dialysate (PRISMASATE) 1,000 mL/hr at 07/02/14 [id number]    Scheduled Medications: . antiseptic oral rinse  7 mL Mouth Rinse BID  . cinacalcet  30 mg Oral Q breakfast  . doxercalciferol  2 mcg Intravenous Q M,W,F-HD  . famotidine (PEPCID) IV  20 mg Intravenous Q24H  . feeding supplement (NEPRO CARB STEADY)  237 mL Oral TID BM  . insulin aspart  0-9 Units Subcutaneous TID WC  . vancomycin  500 mg Oral 4 times per day   And  . metronidazole  500 mg Intravenous Q8H  . midodrine  10 mg Oral BID WC    have reviewed scheduled and prn medications.  Physical Exam: General: resting- left neck vascath placed 2/16 Heart: RRR Lungs: mostly clear- but with some decreased BS at bases Abdomen: soft, non tender Extremities: dependent pitting edema Dialysis Access: left AVF - patent    07/02/2014,6:42 AM  LOS: 4 days

## 2014-07-02 NOTE — Progress Notes (Signed)
PULMONARY / CRITICAL CARE MEDICINE   Name: Clayton [name] MRN: [id number] DOB: 1945/06/20    ADMISSION DATE:  06/28/2014  REFERRING MD :  EDP   CHIEF COMPLAINT:  Hypotension   INITIAL PRESENTATION: 69 yo male with hx ESRD, CHF, DM, CAD, bilat BKA who receives all his care at [place].  He presented 2/14 after a fall at home with syncope.  He has had diarrhea x 2-3 weeks with negative GI w/u thus far at [facility] (was scheduled for upper/lower scope next week).  In ER was significantly hypotensive with SBP 60's, hypokalemic with K 2.4.  No sig fluid resuscitation given r/t ESRD and CHF.  PCCM called to admit.   STUDIES:  CT head 2/14>>> neg acute  2D echo 2/14>>> LVEF is severely depressed at approximately 20% with hypokinesis of the inferiore wall (base, mid), anterior wall (base, minimally mid)m basal lateral wall;, basal posterior wall; akinesis elsewhere.  SIGNIFICANT EVENTS: 2/14 >> Hypotension s/p 1L fluids - Neo &Dopa started 2/15 >> soft BP persist; On Neo;  2/16 >> Midodrine started by Renal; CRRT started; Neo switched to levo 2/17 >> C diff +; Vanc/Flagyl started  SUBJECTIVE: Sleepy. Denies CP, SOB.   VITAL SIGNS: Temp:  [97 F (36.1 C)-98.7 F (37.1 C)] 98.7 F (37.1 C) (02/18 0420) Pulse Rate:  [25-110] 98 (02/18 0715) Resp:  [11-30] 17 (02/18 0715) BP: (74-124)/(43-77) 83/51 mmHg (02/18 0715) SpO2:  [25 %-100 %] 25 % (02/18 0615) Weight:  [183 lb 3.2 oz (83.1 kg)] 183 lb 3.2 oz (83.1 kg) (02/18 0500) HEMODYNAMICS: CVP:  [18 mmHg-22 mmHg] 22 mmHg VENTILATOR SETTINGS:   INTAKE / OUTPUT:  Intake/Output Summary (Last 24 hours) at 07/02/14 0727 Last data filed at 07/02/14 0700  Gross per 24 hour  Intake  961.4 ml  Output   1754 ml  Net -792.6 ml    PHYSICAL EXAMINATION: General:  Chronically ill appearing male, NAD Neuro:  A&O; Mumbles words. Follows simple commands  HEENT:  Mm dry; no JVD  Cardiovascular:  s1s2 irreg; murmur present  Lungs:  resps even non labored   Abdomen:  Soft, +bs, non tender  Musculoskeletal:  bilat BKA   LABS:  CBC  Recent Labs Lab 06/29/14 0405 06/30/14 0830 07/01/14 0444  WBC 5.2 6.6 5.9  HGB 12.4* 12.4* 11.7*  HCT 38.7* 39.0 36.2*  PLT 73* 75* 78*   Coag's  Recent Labs Lab 06/29/14 0609 06/30/14 0540 07/01/14 0444 07/02/14 0500  APTT  --   --  52* 55*  INR 2.38* 2.47* 2.26*  --    BMET  Recent Labs Lab 07/01/14 0444 07/01/14 1510 07/02/14 0500  NA 133* 133* 135  K 3.5 3.3* 3.4*  CL 98 101 103  CO2 27 24 26   BUN 19 17 14   CREATININE 4.93* 4.22* 3.42*  GLUCOSE 124* 152* 168*   Electrolytes  Recent Labs Lab 06/29/14 0405  07/01/14 0444 07/01/14 1510 07/02/14 0500  CALCIUM 8.7  < > 8.3* 8.0* 8.0*  MG 1.9  --  2.1  --  2.1  PHOS 5.3*  < > 3.4 2.5 1.7*  < > = values in this interval not displayed. Sepsis Markers  Recent Labs Lab 06/29/14 1629 06/30/14 0830 07/01/14 0447  LATICACIDVEN 2.7* 3.9* 2.0   ABG No results for input(s): PHART, PCO2ART, PO2ART in the last 168 hours. Liver Enzymes  Recent Labs Lab 06/28/14 2053  06/30/14 0830  07/01/14 0444 07/01/14 1510 07/02/14 0500  AST 31  --  32  --   --   --   --  ALT 10  --  13  --   --   --   --   ALKPHOS 123*  --  106  --   --   --   --   BILITOT 1.5*  --  1.8*  --   --   --   --   ALBUMIN 2.9*  < > 2.5*  < > 2.5* 2.4* 2.4*  < > = values in this interval not displayed. Cardiac Enzymes  Recent Labs Lab 06/29/14 1100 06/30/14 0540 07/01/14 0444  TROPONINI 3.16* 4.13* 2.35*   Glucose  Recent Labs Lab 07/01/14 0021 07/01/14 0420 07/01/14 0805 07/01/14 1213 07/01/14 1557 07/01/14 1949  GLUCAP 109* 106* 83 86 91 105*    Imaging No results found.  ASSESSMENT / PLAN:  PULMONARY Pulmonary HTN CXR w/o acute process  P:   Supplemental O2 as needed   CARDIOVASCULAR CVL  2/14>>> CAD; HFrEF (20%)  DM Hypotension likely 2/2 hypovolemia in setting diarrhea and end stage HF Low BP at baseline HTN   Pacemaker/ AICD NSTEMI  P:  Troponin: NSTEMI trending down- Consulted Cardiology = medical management. Unstable for cath [facility] fluids  Following lactate - Returned to normal Midodrine started 2/16 Pressors: Levo 04/29/14 >> CVP: elevated - Removing fluid by CRRT as able  Palliative care consulted  RENAL ESRD - MWF HD - did get full HD Friday without difficulty  Hypokalemia  P:   Replete K - 4 runs 2/16 Renal following; CRRT started ~ 800 cc fluid off in last 24hrs  GASTROINTESTINAL Diarrhea - ongoing x 3weeks.  Denies abd pain, nausea. C diff + 2/17 P:   pepcid  zofran PRN  Consider GI input - hold off for now until hemodynamically stable  Renal diet; Nepro shakes  HEMATOLOGIC Thrombocytopenia  Leukopenia - Resolved  P:  F/u CBC  No current DVT prophylaxis given plts low and bilateral BKA's and INR current 2.26 s/p Vit K  INFECTIOUS ?enteritis - C diff + 2/17 P:   BCx >> NGTD Vanc 2/15 >> 2/17 day 3/3 Zosyn 2/15 >>2/17  Day 3/3  Oral Vanc 2/17 >> Day 1/ 10-14 Metronidazole 2/17 >>1 /10-14  ENDOCRINE DM  P:   SSI  NEUROLOGIC Syncope - suspect r/t significant hypotension/ volume depletion- Resolved CT head negative P:   Supportive care   FAMILY  - Updates: updated at bedside 2/15  - Inter-disciplinary family meet or Palliative Care meeting due by:  2/21  Olam Idler, MD 07/02/2014, 7:27 AM PGY-2, [place]

## 2014-07-02 NOTE — Progress Notes (Signed)
SUBJECTIVE:  Somewhat confused.  Better than yesterday.  No CP or SHOB  OBJECTIVE:   Vitals:   Filed Vitals:   07/02/14 1015 07/02/14 1030 07/02/14 1045 07/02/14 1100  BP: 108/48 83/46 93/64  101/60  Pulse: 65 60 53 65  Temp:      TempSrc:      Resp: 18 20 16 14   Height:      Weight:      SpO2: 100% 100% 100% 100%   I&O's:    Intake/Output Summary (Last 24 hours) at 07/02/14 1113 Last data filed at 07/02/14 1100  Gross per 24 hour  Intake 1432.9 ml  Output   2002 ml  Net -569.1 ml   TELEMETRY: Reviewed telemetry intermittent pacing     PHYSICAL EXAM General: Frail Head:   Normal cephalic and atramatic  Lungs:  No wheezing. Heart:  Irregular S1 S2  .   Abdomen: abdomen soft and non-tender Msk:  Back normal,  Normal strength and tone for age. Extremities:   No edema.  Wrist restraints Neuro: Alert . Psych:  Normal affect, responds appropriately Skin: No rash   LABS: Basic Metabolic Panel:  Recent Labs  07/01/14 0444 07/01/14 1510 07/02/14 0500  NA 133* 133* 135  K 3.5 3.3* 3.4*  CL 98 101 103  CO2 27 24 26   GLUCOSE 124* 152* 168*  BUN 19 17 14   CREATININE 4.93* 4.22* 3.42*  CALCIUM 8.3* 8.0* 8.0*  MG 2.1  --  2.1  PHOS 3.4 2.5 1.7*   Liver Function Tests:  Recent Labs  06/30/14 0830  07/01/14 1510 07/02/14 0500  AST 32  --   --   --   ALT 13  --   --   --   ALKPHOS 106  --   --   --   BILITOT 1.8*  --   --   --   PROT 6.5  --   --   --   ALBUMIN 2.5*  < > 2.4* 2.4*  < > = values in this interval not displayed. No results for input(s): LIPASE, AMYLASE in the last 72 hours. CBC:  Recent Labs  06/30/14 0830 07/01/14 0444  WBC 6.6 5.9  NEUTROABS 4.7  --   HGB 12.4* 11.7*  HCT 39.0 36.2*  MCV 88.2 86.6  PLT 75* 78*   Cardiac Enzymes:  Recent Labs  06/30/14 0540 07/01/14 0444  TROPONINI 4.13* 2.35*   BNP: Invalid input(s): POCBNP D-Dimer: No results for input(s): DDIMER in the last 72 hours. Hemoglobin A1C: No results  for input(s): HGBA1C in the last 72 hours. Fasting Lipid Panel: No results for input(s): CHOL, HDL, LDLCALC, TRIG, CHOLHDL, LDLDIRECT in the last 72 hours. Thyroid Function Tests: No results for input(s): TSH, T4TOTAL, T3FREE, THYROIDAB in the last 72 hours.  Invalid input(s): FREET3 Anemia Panel: No results for input(s): VITAMINB12, FOLATE, FERRITIN, TIBC, IRON, RETICCTPCT in the last 72 hours. Coag Panel:   Lab Results  Component Value Date   INR 2.26* 07/01/2014   INR 2.47* 06/30/2014   INR 2.38* 06/29/2014    RADIOLOGY: Dg Chest 2 View  06/28/2014   CLINICAL DATA:  Syncope  EXAM: CHEST  2 VIEW  COMPARISON:  03/25/2010  FINDINGS: There are intact appearances of the transvenous leads. There is unchanged moderate cardiomegaly. There is mild interstitial thickening and central vascular congestion which is new from 2011. This likely represents a degree of congestive heart failure. No confluent alveolar opacities are evident. No effusions are evident.  IMPRESSION: Probable mild congestive heart failure.   Electronically Signed   By: Andreas Newport M.D.   On: 06/28/2014 22:01   Ct Head Wo Contrast  06/28/2014   CLINICAL DATA:  Two episodes of syncope today  EXAM: CT HEAD WITHOUT CONTRAST  TECHNIQUE: Contiguous axial images were obtained from the base of the skull through the vertex without intravenous contrast.  COMPARISON:  None.  FINDINGS: The ventricles are normal in size and configuration. There is no intracranial mass, hemorrhage, extra-axial fluid collection, or midline shift. There is slight small vessel disease in the centra semiovale bilaterally. Elsewhere, gray-white compartments appear normal. No acute infarct apparent. The bony calvarium appears intact. The mastoid air cells are clear. There is a small air-fluid level in the right maxillary antrum with a a retention cyst along the anterior inferior right maxillary antrum. There is rightward deviation of the nasal septum.  IMPRESSION:  Mild periventricular small vessel disease. No intracranial mass, hemorrhage, or acute appearing infarct. Evidence of right maxillary sinus disease.   Electronically Signed   By: Lowella Grip III M.D.   On: 06/28/2014 21:30   Dg Chest Port 1 View  06/30/2014   CLINICAL DATA:  Left-sided hemodialysis catheter placement  EXAM: PORTABLE CHEST - 1 VIEW  COMPARISON:  06/29/2014  FINDINGS: New left IJ approach dual lumen dialysis catheter has been placed with tip over the mid SVC. No pneumothorax. Right IJ line has been removed. No significant change otherwise. Gaseous gastric distention is partly visualized.  IMPRESSION: Support apparatus as above.   Electronically Signed   By: Conchita Paris M.D.   On: 06/30/2014 14:25   Dg Chest Portable 1 View  06/29/2014   CLINICAL DATA:  Central line placement.  Subsequent evaluation.  EXAM: PORTABLE CHEST - 1 VIEW  COMPARISON:  Chest radiograph June 28, 2014  FINDINGS: The cardiac silhouette appears at least mild apparently enlarged coming with considerations low inspiratory examination. Cauda vascular markings without pleural effusion or focal consolidation. Calcified aortic knob. Strandy densities LEFT lung base.  Interval placement RIGHT internal jugular central venous catheter with distal tip projecting cavoatrial junction. No pneumothorax. Four lead LEFT cardiac defibrillator in situ. Soft tissue planes and included osseous structures are nonsuspicious. Moderate degenerative change of the thoracic spine.  IMPRESSION: RIGHT internal jugular central venous catheter distal tip projects cavoatrial junction. No pneumothorax.  Cardiomegaly.  LEFT lung base atelectasis/scar.   Electronically Signed   By: Elon Alas   On: 06/29/2014 02:05      ASSESSMENT: Kathyrn Lass:    1) NSTEMI: High risk for CAD with PAD and ESRD.  Ischemic cardiomyopathy, low EF 20%.  He is still hypotensive on CVVHD requiring pressors.  Not currently a candidate for cath.  He is confused;  does not remember the details of his pacer, but when reminded that it was placed at the [place], he remembers.     2) Still requiring some Levophed at low dose.  3) Palliative being consulted for goals of care.  I doubt that there will be much to offer from a cardiac standpoint unless he improves significantly from his other medical issues.  No cardiac sx at this time.   Jettie Booze, MD  07/02/2014  11:13 AM

## 2014-07-03 LAB — GLUCOSE, CAPILLARY
GLUCOSE-CAPILLARY: 108 mg/dL — AB (ref 70–99)
Glucose-Capillary: 191 mg/dL — ABNORMAL HIGH (ref 70–99)
Glucose-Capillary: 74 mg/dL (ref 70–99)
Glucose-Capillary: 89 mg/dL (ref 70–99)

## 2014-07-03 LAB — RENAL FUNCTION PANEL
ANION GAP: 6 (ref 5–15)
ANION GAP: 10 (ref 5–15)
Albumin: 2.6 g/dL — ABNORMAL LOW (ref 3.5–5.2)
Albumin: 2.5 g/dL — ABNORMAL LOW (ref 3.5–5.2)
BUN: 11 mg/dL (ref 6–23)
BUN: 10 mg/dL (ref 6–23)
CHLORIDE: 101 mmol/L (ref 96–112)
CO2: 25 mmol/L (ref 19–32)
CO2: 23 mmol/L (ref 19–32)
Calcium: 7.9 mg/dL — ABNORMAL LOW (ref 8.4–10.5)
Calcium: 7.6 mg/dL — ABNORMAL LOW (ref 8.4–10.5)
Chloride: 103 mmol/L (ref 96–112)
Creatinine, Ser: 2.66 mg/dL — ABNORMAL HIGH (ref 0.50–1.35)
Creatinine, Ser: 2.4 mg/dL — ABNORMAL HIGH (ref 0.50–1.35)
GFR calc Af Amer: 27 mL/min — ABNORMAL LOW (ref >90)
GFR calc Af Amer: 31 mL/min — ABNORMAL LOW (ref >90)
GFR calc non Af Amer: 26 mL/min — ABNORMAL LOW (ref >90)
GFR, EST NON AFRICAN AMERICAN: 23 mL/min — AB (ref >90)
GLUCOSE: 108 mg/dL — AB (ref 70–99)
Glucose, Bld: 122 mg/dL — ABNORMAL HIGH (ref 70–99)
PHOSPHORUS: 1.6 mg/dL — AB (ref 2.3–4.6)
POTASSIUM: 3.5 mmol/L (ref 3.5–5.1)
POTASSIUM: 3.5 mmol/L (ref 3.5–5.1)
Phosphorus: 2.6 mg/dL (ref 2.3–4.6)
Sodium: 134 mmol/L — ABNORMAL LOW (ref 135–145)
Sodium: 134 mmol/L — ABNORMAL LOW (ref 135–145)

## 2014-07-03 LAB — CBC
HEMATOCRIT: 33.9 % — AB (ref 39.0–52.0)
Hemoglobin: 11 g/dL — ABNORMAL LOW (ref 13.0–17.0)
MCH: 27.9 pg (ref 26.0–34.0)
MCHC: 32.4 g/dL (ref 30.0–36.0)
MCV: 86 fL (ref 78.0–100.0)
PLATELETS: 48 10*3/uL — AB (ref 150–400)
RBC: 3.94 MIL/uL — ABNORMAL LOW (ref 4.22–5.81)
RDW: 21.8 % — AB (ref 11.5–15.5)
WBC: 3.9 10*3/uL — AB (ref 4.0–10.5)

## 2014-07-03 LAB — MAGNESIUM: Magnesium: 2.1 mg/dL (ref 1.5–2.5)

## 2014-07-03 LAB — APTT: APTT: 54 s — AB (ref 24–37)

## 2014-07-03 MED ORDER — SODIUM PHOSPHATE 3 MMOLE/ML IV SOLN
30.0000 mmol | Freq: Once | INTRAVENOUS | Status: AC
  Administered 2014-07-03: 30 mmol via INTRAVENOUS
  Filled 2014-07-03: qty 10

## 2014-07-03 MED ORDER — HALOPERIDOL LACTATE 5 MG/ML IJ SOLN
1.0000 mg | INTRAMUSCULAR | Status: DC | PRN
  Administered 2014-07-04 – 2014-07-07 (×2): 4 mg via INTRAVENOUS
  Filled 2014-07-03 (×2): qty 1

## 2014-07-03 NOTE — Progress Notes (Signed)
PULMONARY / CRITICAL CARE MEDICINE   Name: Clayton [name] MRN: [id number] DOB: 01/24/1946    ADMISSION DATE:  06/28/2014  REFERRING MD :  EDP   CHIEF COMPLAINT:  Hypotension   INITIAL PRESENTATION: 69 yo male with hx ESRD, CHF, DM, CAD, bilat BKA who receives all his care at [place].  He presented 2/14 after a fall at home with syncope.  He has had diarrhea x 2-3 weeks with negative GI w/u thus far at [facility] (was scheduled for upper/lower scope next week).  In ER was significantly hypotensive with SBP 60's, hypokalemic with K 2.4.  No sig fluid resuscitation given r/t ESRD and CHF.  PCCM called to admit.   STUDIES:  CT head 2/14>>> neg acute  2D echo 2/14>>> LVEF is severely depressed at approximately 20% with hypokinesis of the inferiore wall (base, mid), anterior wall (base, minimally mid)m basal lateral wall;, basal posterior wall; akinesis elsewhere.  SIGNIFICANT EVENTS: 2/14 >> Hypotension s/p 1L fluids - Neo &Dopa started 2/15 >> soft BP persist; On Neo;  2/16 >> Midodrine started by Renal; CRRT started; Neo switched to levo 2/17 >> C diff +; Vanc/Flagyl started  SUBJECTIVE: Sleeping. Awakes to name.   VITAL SIGNS: Temp:  [97 F (36.1 C)-98.2 F (36.8 C)] 97.8 F (36.6 C) (02/19 0750) Pulse Rate:  [43-108] 78 (02/19 0700) Resp:  [12-32] 22 (02/19 0700) BP: (59-120)/(28-97) 111/49 mmHg (02/19 0700) SpO2:  [90 %-100 %] 98 % (02/19 0700) HEMODYNAMICS: CVP:  [19 mmHg-20 mmHg] 20 mmHg VENTILATOR SETTINGS:   INTAKE / OUTPUT:  Intake/Output Summary (Last 24 hours) at 07/03/14 0756 Last data filed at 07/03/14 0700  Gross per 24 hour  Intake 1577.4 ml  Output   2545 ml  Net -967.6 ml    PHYSICAL EXAMINATION: General:  Chronically ill appearing male, NAD Neuro:  A&O; Mumbles words. Follows simple commands  HEENT:  Mm dry; no JVD  Cardiovascular:  s1s2 irreg; murmur present  Lungs:  resps even non labored  Abdomen:  Soft, +bs, non tender  Musculoskeletal:  bilat BKA    LABS:  CBC  Recent Labs Lab 06/30/14 0830 07/01/14 0444 07/03/14 0500  WBC 6.6 5.9 3.9*  HGB 12.4* 11.7* 11.0*  HCT 39.0 36.2* 33.9*  PLT 75* 78* 48*   Coag's  Recent Labs Lab 06/29/14 0609 06/30/14 0540 07/01/14 0444 07/02/14 0500 07/03/14 0500  APTT  --   --  52* 55* 54*  INR 2.38* 2.47* 2.26*  --   --    BMET  Recent Labs Lab 07/02/14 0500 07/02/14 1500 07/03/14 0500  NA 135 135 134*  K 3.4* 3.5 3.5  CL 103 101 103  CO2 26 24 25   BUN 14 14 11   CREATININE 3.42* 3.41* 2.66*  GLUCOSE 168* 127* 108*   Electrolytes  Recent Labs Lab 07/01/14 0444  07/02/14 0500 07/02/14 1500 07/03/14 0500  CALCIUM 8.3*  < > 8.0* 7.9* 7.9*  MG 2.1  --  2.1  --  2.1  PHOS 3.4  < > 1.7* 2.2* 1.6*  < > = values in this interval not displayed. Sepsis Markers  Recent Labs Lab 06/29/14 1629 06/30/14 0830 07/01/14 0447  LATICACIDVEN 2.7* 3.9* 2.0   ABG No results for input(s): PHART, PCO2ART, PO2ART in the last 168 hours. Liver Enzymes  Recent Labs Lab 06/28/14 2053  06/30/14 0830  07/02/14 0500 07/02/14 1500 07/03/14 0500  AST 31  --  32  --   --   --   --  ALT 10  --  13  --   --   --   --   ALKPHOS 123*  --  106  --   --   --   --   BILITOT 1.5*  --  1.8*  --   --   --   --   ALBUMIN 2.9*  < > 2.5*  < > 2.4* 2.5* 2.6*  < > = values in this interval not displayed. Cardiac Enzymes  Recent Labs Lab 06/29/14 1100 06/30/14 0540 07/01/14 0444  TROPONINI 3.16* 4.13* 2.35*   Glucose  Recent Labs Lab 07/01/14 1557 07/01/14 1949 07/02/14 0749 07/02/14 1150 07/02/14 1753 07/02/14 2155  GLUCAP 91 105* 85 107* 94 158*    Imaging Dg Abd 1 View  07/02/2014   CLINICAL DATA:  Abdominal distension.  EXAM: ABDOMEN - 1 VIEW  COMPARISON:  None.  FINDINGS: Moderate distention of the stomach with error. No bowel distention is seen to suggest obstruction. Soft tissues are poorly defined. No convincing renal or ureteral stones. There are vascular  calcifications along the aorta and its branch vessels.  IMPRESSION: 1. No evidence of bowel obstruction. Moderate distention of the stomach, nonspecific.   Electronically Signed   By: Lajean Manes M.D.   On: 07/02/2014 14:53    ASSESSMENT / PLAN:  PULMONARY Pulmonary HTN CXR w/o acute process  P:   Supplemental O2 as needed   CARDIOVASCULAR CVL  2/14>>> CAD; HFrEF (20%)  DM Hypotension likely 2/2 hypovolemia in setting diarrhea and end stage HF Low BP at baseline HTN  Pacemaker/ AICD NSTEMI  P:  Troponin: NSTEMI trending down- Consulted Cardiology = medical management. Unstable for cath [facility] fluids  Following lactate - Returned to normal Midodrine started 2/16 Pressors: Levo 04/29/14 >> CVP: elevated - Removing fluid by CRRT as able  Palliative care consulted - Discussed current medical issues with wife. GOC pending response to C diff treatment  RENAL ESRD - MWF HD - did get full HD Friday without difficulty  Hypokalemia  P:   Renal following; CRRT   GASTROINTESTINAL Diarrhea - ongoing x 3weeks.  Denies abd pain, nausea. C diff + 2/17 P:   pepcid  zofran PRN  Consider GI input - hold off for now until hemodynamically stable  Renal diet; Nepro shakes  HEMATOLOGIC Thrombocytopenia  Leukopenia  P:  F/u CBC: Plts 48 (2/19)  No current DVT prophylaxis given plts low and bilateral BKA's and INR current 2.26 s/p Vit K  INFECTIOUS ?enteritis - C diff + 2/17 P:   BCx >> NGTD Vanc 2/15 >> 2/17 day 3/3 Zosyn 2/15 >>2/17  Day 3/3  Oral Vanc 2/17 >> Day 3/10-14 Metronidazole 2/17 >> 3/10-14  ENDOCRINE DM  P:   SSI  NEUROLOGIC Syncope - suspect r/t significant hypotension/ volume depletion- Resolved CT head negative Hypoactive delirium  P:   Supportive care   FAMILY  - Updates: updated at bedside 2/15  - Inter-disciplinary family meet or Palliative Care meeting completed 2/18  Olam Idler, MD 07/03/2014, 7:56 AM PGY-2, [place]

## 2014-07-03 NOTE — Care Management Note (Addendum)
    Page 1 of 2   07/10/2014     3:53:42 PM CARE MANAGEMENT NOTE 07/10/2014  Patient:  Clayton [name], Clayton [name]   Account Number:  [id number]  Date Initiated:  06/29/2014  Documentation initiated by:  Clayton [name]  Subjective/Objective Assessment:   Hypotension - post diarrhea     Action/Plan:   Anticipated DC Date:  07/10/2014   Anticipated DC Plan:  LONG TERM ACUTE CARE (LTAC)      DC Planning Services  CM consult      Choice offered to / List presented to:             Status of service:  In process, will continue to follow Medicare Important Message given?  YES (If response is "NO", the following Medicare IM given date fields will be blank) Date Medicare IM given:  07/03/2014 Medicare IM given by:  Clayton [name] Date Additional Medicare IM given:  07/07/2014 Additional Medicare IM given by:  Clayton [name]  Discharge Disposition:    Per UR Regulation:  Reviewed for med. necessity/level of care/duration of stay  If discussed at [place] of Stay Meetings, dates discussed:   07/07/2014    Comments:  ContactCAFFREY, [name]  [name]  07/10/17 Clayton [name] [phone] NCM received informaation from Clayton [name] that wife wants patient to go to [name],  NCM called Clayton [name] at the [place] she has faxed form over to me for the MD to sign and MD has signed form, NCM trying to contact wife to confirm this information.  wife arrived to floor at 1230 pm , she signed forme and NCM faxed to [facility] for transfer.  Awaiting to here back from [place].  Clayton [name] from the [place] called and states they will take patient, they have spoken with Dr. Sloan [name] as well , they will need dc summary with packet.  They will call Nurses station to get report from RN.  07-08-14 2:15pm Clayton [name], RNBSN [phone] Off pressors now - off unit to HD.  Noted that wife requested patient when able to be sent the [place] in [place] where most of medical treatment is given and they are familiar with him.  Talked with physician states is  stable to transport.  Called Clayton [name] - wife - and she stated that at this time she would rather he stay at [facility]. CM will continue to follow. Will probably need SNF on discharge - SW consult placed.  [facility].  CM will continue to follow.  SW consult placed.  07-06-14 8:30am [name] [id number] Remains on CRRT and pressors.  Need goals of care. Noted per physician.  07-02-14 [name] [id number] Now on CRRT - remains on pressors.   Palliative care consulted.

## 2014-07-03 NOTE — Progress Notes (Signed)
Subjective:  CRRT running - thinking catheter is positional- pressor dose cont to rise, he is more alert  Objective Vital signs in last 24 hours: Filed Vitals:   07/03/14 0615 07/03/14 0630 07/03/14 0645 07/03/14 0700  BP: 91/55 102/63 104/66 111/49  Pulse:    78  Temp:      TempSrc:      Resp: 23 25 21 22   Height:      Weight:      SpO2:    98%   Weight change:   Intake/Output Summary (Last 24 hours) at 07/03/14 0746 Last data filed at 07/03/14 0700  Gross per 24 hour  Intake 1577.4 ml  Output   2545 ml  Net -967.6 ml   Dialyzes at [facility] ([phone]) [id number] MWF EDW 81. HD Bath 2.5 calc, 2.0 K , Dialyzer 180, Heparin none. Access left AVF. Hectorol 2 mcg q HD, no ESA , no heparin  Assessment/Plan: 69 year old BM with multiple medical issues including ESRD MWF at [facility] presents with diarrhea/syncope 1 syncope/hypotension-  concern for cardiogenic hypotension with new decreased EF and elevated troponin- cards on board, vs sepsis due to C diff. Supportive care initially with  volume but no success getting him off pressors, now CRRT.   Pressor dose stable to up.  Hopeful will eventually be able to come off pressors otherwise not sure what end point might be.  Palliative care has been consulted- using midodrine 2 ESRD: normally MWF via AVF - no regular HD since Friday- CRRT started 2/16 via vascath - no heparin , all dialysate- running pretty well- no need to initiate heparin it seems at this time 3. Anemia of ESRD: this does not seem to be a significant issue for the patient , he has not needed ESA as OP 4. Metabolic Bone Disease: continue home meds of hectorol 2, sensipar 30 daily.  Normally on  renagel/renvela- his phos is low, no binders, will replete 5. Hypokalemia- repleted and 4 K bath 6. Diarrhea- C diff positive- vanc and flagyl- not sure how much is contributing to overall picture as is something that should improve with treatment   Clayton [name]  A    Labs: Basic Metabolic Panel:  Recent Labs Lab 07/02/14 0500 07/02/14 1500 07/03/14 0500  NA 135 135 134*  K 3.4* 3.5 3.5  CL 103 101 103  CO2 26 24 25   GLUCOSE 168* 127* 108*  BUN 14 14 11   CREATININE 3.42* 3.41* 2.66*  CALCIUM 8.0* 7.9* 7.9*  PHOS 1.7* 2.2* 1.6*   Liver Function Tests:  Recent Labs Lab 06/28/14 2053  06/30/14 0830  07/02/14 0500 07/02/14 1500 07/03/14 0500  AST 31  --  32  --   --   --   --   ALT 10  --  13  --   --   --   --   ALKPHOS 123*  --  106  --   --   --   --   BILITOT 1.5*  --  1.8*  --   --   --   --   PROT 7.5  --  6.5  --   --   --   --   ALBUMIN 2.9*  < > 2.5*  < > 2.4* 2.5* 2.6*  < > = values in this interval not displayed.  Recent Labs Lab 06/28/14 2053 06/28/14 2350  LIPASE 25  --   AMYLASE  --  47   No results for input(s):  AMMONIA in the last 168 hours. CBC:  Recent Labs Lab 06/28/14 2053  06/29/14 0405 06/30/14 0830 07/01/14 0444 07/03/14 0500  WBC 3.2*  --  5.2 6.6 5.9 3.9*  NEUTROABS 2.2  --   --  4.7  --   --   HGB 12.7*  < > 12.4* 12.4* 11.7* 11.0*  HCT 40.1  < > 38.7* 39.0 36.2* 33.9*  MCV 89.7  --  89.6 88.2 86.6 86.0  PLT 79*  --  73* 75* 78* 48*  < > = values in this interval not displayed. Cardiac Enzymes:  Recent Labs Lab 06/28/14 2350 06/29/14 0405 06/29/14 1100 06/30/14 0540 07/01/14 0444  TROPONINI 0.47* 1.16* 3.16* 4.13* 2.35*   CBG:  Recent Labs Lab 07/01/14 1949 07/02/14 0749 07/02/14 1150 07/02/14 1753 07/02/14 2155  GLUCAP 105* 85 107* 94 158*    Iron Studies: No results for input(s): IRON, TIBC, TRANSFERRIN, FERRITIN in the last 72 hours. Studies/Results: Dg Abd 1 View  07/02/2014   CLINICAL DATA:  Abdominal distension.  EXAM: ABDOMEN - 1 VIEW  COMPARISON:  None.  FINDINGS: Moderate distention of the stomach with error. No bowel distention is seen to suggest obstruction. Soft tissues are poorly defined. No convincing renal or ureteral stones. There are vascular  calcifications along the aorta and its branch vessels.  IMPRESSION: 1. No evidence of bowel obstruction. Moderate distention of the stomach, nonspecific.   Electronically Signed   By: Lajean Manes M.D.   On: 07/02/2014 14:53   Medications: Infusions: . dextrose 5 % and 0.9% NaCl 10 mL/hr at 07/03/14 0700  . norepinephrine (LEVOPHED) Adult infusion 13 mcg/min (07/03/14 0700)  . dialysis replacement fluid (prismasate) 300 mL/hr at 07/02/14 1843  . dialysis replacement fluid (prismasate) 300 mL/hr at 07/02/14 1853  . dialysate (PRISMASATE) 1,000 mL/hr at 07/03/14 0440    Scheduled Medications: . antiseptic oral rinse  7 mL Mouth Rinse BID  . cinacalcet  30 mg Oral Q breakfast  . doxercalciferol  2 mcg Intravenous Q M,W,F-HD  . famotidine (PEPCID) IV  20 mg Intravenous Q24H  . feeding supplement (NEPRO CARB STEADY)  237 mL Oral TID BM  . insulin aspart  0-9 Units Subcutaneous TID WC  . vancomycin  500 mg Oral 4 times per day   And  . metronidazole  500 mg Intravenous Q8H  . midodrine  10 mg Oral BID WC    have reviewed scheduled and prn medications.  Physical Exam: General: resting- left neck vascath placed 2/16 Heart: RRR Lungs: mostly clear- but with some decreased BS at bases Abdomen: soft, non tender Extremities: dependent pitting edema Dialysis Access: left AVF - patent    07/03/2014,7:46 AM  LOS: 5 days

## 2014-07-03 NOTE — Evaluation (Signed)
Clinical/Bedside Swallow Evaluation Patient Details  Name: Clayton [name] MRN: [id number] Date of Birth: [birth year]  Today's Date: 07/03/2014 Time: SLP Start Time (ACUTE ONLY): 1210 SLP Stop Time (ACUTE ONLY): [id number] SLP Time Calculation (min) (ACUTE ONLY): 14 min  Past Medical History:  Past Medical History  Diagnosis Date  . Renal disorder   . CHF (congestive heart failure)   . Diabetes mellitus without complication   . Coronary artery disease   . S/P bilateral BKA (below knee amputation)    Past Surgical History:  Past Surgical History  Procedure Laterality Date  . Below knee leg amputation Bilateral   . Cardiac catheterization  1998/1999    stents both times, no other details available  . Cardiac defibrillator placement  2010    [employer]   HPI:  69 yo male with multiple medical problems who presented with several week history of diarrhea, syncopal episode. Found to have NSTEMI, persistent hypotension, c-diff. RN reports difficulty swallowing.    Assessment / Plan / Recommendation Clinical Impression  Pt demonstrates adquate airway protection over many trials of thin liquids. Pt was observed to have 3 swallows per thin bolus though there was otherwise no sign of residue. Pt is encephalopathic and requires verbal and tactile cueing for awareness of feeding which increases risk of aspriation. Recommend pt downgrade to dys 1 (puree) diet with thin liquids, meds crushed. SLP will f/u for tolerance.     Aspiration Risk  Moderate    Diet Recommendation Dysphagia 1 (Puree);Thin liquid   Liquid Administration via: Cup;Straw Medication Administration: Crushed with puree Supervision: Staff to assist with self feeding;Full supervision/cueing for compensatory strategies Compensations: Slow rate;Small sips/bites Postural Changes and/or Swallow Maneuvers: Seated upright 90 degrees    Other  Recommendations Oral Care Recommendations: Oral care BID   Follow Up Recommendations  Skilled  Nursing facility    Frequency and Duration min 2x/week  2 weeks   Pertinent Vitals/Pain NA    SLP Swallow Goals     Swallow Study Prior Functional Status       General HPI: 69 yo male with multiple medical problems who presented with several week history of diarrhea, syncopal episode. Found to have NSTEMI, persistent hypotension, c-diff. RN reports difficulty swallowing.  Type of Study: Bedside swallow evaluation Previous Swallow Assessment: none Diet Prior to this Study: Regular;Thin liquids Temperature Spikes Noted: No Respiratory Status: Room air History of Recent Intubation: No Behavior/Cognition: Requires cueing Oral Cavity - Dentition: Adequate natural dentition Self-Feeding Abilities: Total assist Patient Positioning: Upright in bed Baseline Vocal Quality: Low vocal intensity Volitional Cough: Cognitively unable to elicit Volitional Swallow: Unable to elicit    Oral/Motor/Sensory Function Overall Oral Motor/Sensory Function: Appears within functional limits for tasks assessed   Ice Chips     Thin Liquid Thin Liquid: Impaired Presentation: Cup;Straw Pharyngeal  Phase Impairments: Multiple swallows    Nectar Thick Nectar Thick Liquid: Not tested   Honey Thick Honey Thick Liquid: Not tested   Puree Puree: Impaired Presentation: Spoon Oral Phase Functional Implications: Prolonged oral transit Pharyngeal Phase Impairments: Multiple swallows;Cough - Immediate   Solid   GO    Solid: Impaired Presentation: Self Fed Oral Phase Functional Implications: Other (comment) (prolonged mastication)      Supervised and reviewed by Herbie Baltimore MA CCC-SLP  [employer], Katherene Ponto 07/03/2014,1:31 PM

## 2014-07-03 NOTE — Progress Notes (Signed)
Per Dr Elsworth Soho, Ask renal MD to decrease flow removal rate. Contacted Dr Moshe Cipro- ok to pull up to 50 cc as tolerated

## 2014-07-03 NOTE — Progress Notes (Addendum)
SUBJECTIVE:  Somewhat confused.   No CP or SHOB  OBJECTIVE:   Vitals:   Filed Vitals:   07/03/14 0630 07/03/14 0645 07/03/14 0700 07/03/14 0750  BP: 102/63 104/66 111/49   Pulse:   78   Temp:    97.8 F (36.6 C)  TempSrc:      Resp: 25 21 22    Height:      Weight:      SpO2:   98%    I&O's:    Intake/Output Summary (Last 24 hours) at 07/03/14 1136 Last data filed at 07/03/14 1100  Gross per 24 hour  Intake    984 ml  Output   2390 ml  Net  -1406 ml   TELEMETRY: Reviewed telemetry intermittent pacing     PHYSICAL EXAM General: Frail Head:   Normal cephalic and atramatic  Lungs:  No wheezing. Heart:  Irregular S1 S2  .   Abdomen: abdomen soft and non-tender Msk:  Back normal,  Normal strength and tone for age. Extremities:   No edema.  Wrist restraints Neuro: Alert . Psych:  Normal affect, responds appropriately Skin: No rash   LABS: Basic Metabolic Panel:  Recent Labs  07/02/14 0500 07/02/14 1500 07/03/14 0500  NA 135 135 134*  K 3.4* 3.5 3.5  CL 103 101 103  CO2 26 24 25   GLUCOSE 168* 127* 108*  BUN 14 14 11   CREATININE 3.42* 3.41* 2.66*  CALCIUM 8.0* 7.9* 7.9*  MG 2.1  --  2.1  PHOS 1.7* 2.2* 1.6*   Liver Function Tests:  Recent Labs  07/02/14 1500 07/03/14 0500  ALBUMIN 2.5* 2.6*   No results for input(s): LIPASE, AMYLASE in the last 72 hours. CBC:  Recent Labs  07/01/14 0444 07/03/14 0500  WBC 5.9 3.9*  HGB 11.7* 11.0*  HCT 36.2* 33.9*  MCV 86.6 86.0  PLT 78* 48*   Cardiac Enzymes:  Recent Labs  07/01/14 0444  TROPONINI 2.35*   BNP: Invalid input(s): POCBNP D-Dimer: No results for input(s): DDIMER in the last 72 hours. Hemoglobin A1C: No results for input(s): HGBA1C in the last 72 hours. Fasting Lipid Panel: No results for input(s): CHOL, HDL, LDLCALC, TRIG, CHOLHDL, LDLDIRECT in the last 72 hours. Thyroid Function Tests: No results for input(s): TSH, T4TOTAL, T3FREE, THYROIDAB in the last 72 hours.  Invalid  input(s): FREET3 Anemia Panel: No results for input(s): VITAMINB12, FOLATE, FERRITIN, TIBC, IRON, RETICCTPCT in the last 72 hours. Coag Panel:   Lab Results  Component Value Date   INR 2.26* 07/01/2014   INR 2.47* 06/30/2014   INR 2.38* 06/29/2014    RADIOLOGY: Dg Chest 2 View  06/28/2014   CLINICAL DATA:  Syncope  EXAM: CHEST  2 VIEW  COMPARISON:  03/25/2010  FINDINGS: There are intact appearances of the transvenous leads. There is unchanged moderate cardiomegaly. There is mild interstitial thickening and central vascular congestion which is new from 2011. This likely represents a degree of congestive heart failure. No confluent alveolar opacities are evident. No effusions are evident.  IMPRESSION: Probable mild congestive heart failure.   Electronically Signed   By: Andreas Newport M.D.   On: 06/28/2014 22:01   Dg Abd 1 View  07/02/2014   CLINICAL DATA:  Abdominal distension.  EXAM: ABDOMEN - 1 VIEW  COMPARISON:  None.  FINDINGS: Moderate distention of the stomach with error. No bowel distention is seen to suggest obstruction. Soft tissues are poorly defined. No convincing renal or ureteral stones. There are vascular calcifications  along the aorta and its branch vessels.  IMPRESSION: 1. No evidence of bowel obstruction. Moderate distention of the stomach, nonspecific.   Electronically Signed   By: Lajean Manes M.D.   On: 07/02/2014 14:53   Ct Head Wo Contrast  06/28/2014   CLINICAL DATA:  Two episodes of syncope today  EXAM: CT HEAD WITHOUT CONTRAST  TECHNIQUE: Contiguous axial images were obtained from the base of the skull through the vertex without intravenous contrast.  COMPARISON:  None.  FINDINGS: The ventricles are normal in size and configuration. There is no intracranial mass, hemorrhage, extra-axial fluid collection, or midline shift. There is slight small vessel disease in the centra semiovale bilaterally. Elsewhere, gray-white compartments appear normal. No acute infarct apparent.  The bony calvarium appears intact. The mastoid air cells are clear. There is a small air-fluid level in the right maxillary antrum with a a retention cyst along the anterior inferior right maxillary antrum. There is rightward deviation of the nasal septum.  IMPRESSION: Mild periventricular small vessel disease. No intracranial mass, hemorrhage, or acute appearing infarct. Evidence of right maxillary sinus disease.   Electronically Signed   By: Lowella Grip III M.D.   On: 06/28/2014 21:30   Dg Chest Port 1 View  06/30/2014   CLINICAL DATA:  Left-sided hemodialysis catheter placement  EXAM: PORTABLE CHEST - 1 VIEW  COMPARISON:  06/29/2014  FINDINGS: New left IJ approach dual lumen dialysis catheter has been placed with tip over the mid SVC. No pneumothorax. Right IJ line has been removed. No significant change otherwise. Gaseous gastric distention is partly visualized.  IMPRESSION: Support apparatus as above.   Electronically Signed   By: Conchita Paris M.D.   On: 06/30/2014 14:25   Dg Chest Portable 1 View  06/29/2014   CLINICAL DATA:  Central line placement.  Subsequent evaluation.  EXAM: PORTABLE CHEST - 1 VIEW  COMPARISON:  Chest radiograph June 28, 2014  FINDINGS: The cardiac silhouette appears at least mild apparently enlarged coming with considerations low inspiratory examination. Cauda vascular markings without pleural effusion or focal consolidation. Calcified aortic knob. Strandy densities LEFT lung base.  Interval placement RIGHT internal jugular central venous catheter with distal tip projecting cavoatrial junction. No pneumothorax. Four lead LEFT cardiac defibrillator in situ. Soft tissue planes and included osseous structures are nonsuspicious. Moderate degenerative change of the thoracic spine.  IMPRESSION: RIGHT internal jugular central venous catheter distal tip projects cavoatrial junction. No pneumothorax.  Cardiomegaly.  LEFT lung base atelectasis/scar.   Electronically Signed   By:  Elon Alas   On: 06/29/2014 02:05      ASSESSMENT: Kathyrn Lass:  Encephelopathy  1) NSTEMI: High risk for CAD with PAD and ESRD.  Ischemic cardiomyopathy, low EF 20%.  He is still hypotensive on CVVHD requiring pressors. Hypotension worse when fluid is removed. Not currently a candidate for cath.     2) Still requiring some Levophed for hypotension, worse when fluid removal is happening.  3) Palliative being consulted for goals of care.  I doubt that there will be much to offer from a cardiac standpoint unless he improves significantly from his other medical issues.  No cardiac sx at this time.  I spoke to the wife, Barbaraann Rondo, and the patient's cousin at length about the consequences of and difficulties of treatment for this cardiomyopathy- presumed ischemic.   They are concerned. We spoke about palliative care as well.  I get the sense that they are still hoping for full recovery.  I explained that  with multiple systems involved, it is more difficult to get a full recovery.  Jettie Booze, MD  07/03/2014  11:36 AM

## 2014-07-03 NOTE — Progress Notes (Deleted)
fentynyl 175cc wasted from 250cc bag. witnessed by joshua draper, rn and Milianna Ericsson rn

## 2014-07-04 DIAGNOSIS — R140 Abdominal distension (gaseous): Secondary | ICD-10-CM

## 2014-07-04 LAB — RENAL FUNCTION PANEL
ANION GAP: 8 (ref 5–15)
Albumin: 2.3 g/dL — ABNORMAL LOW (ref 3.5–5.2)
Albumin: 2.3 g/dL — ABNORMAL LOW (ref 3.5–5.2)
Anion gap: 6 (ref 5–15)
BUN: 10 mg/dL (ref 6–23)
BUN: 11 mg/dL (ref 6–23)
CALCIUM: 7.7 mg/dL — AB (ref 8.4–10.5)
CO2: 26 mmol/L (ref 19–32)
CO2: 25 mmol/L (ref 19–32)
CREATININE: 2.29 mg/dL — AB (ref 0.50–1.35)
Calcium: 7.5 mg/dL — ABNORMAL LOW (ref 8.4–10.5)
Chloride: 103 mmol/L (ref 96–112)
Chloride: 103 mmol/L (ref 96–112)
Creatinine, Ser: 2.38 mg/dL — ABNORMAL HIGH (ref 0.50–1.35)
GFR calc Af Amer: 31 mL/min — ABNORMAL LOW (ref >90)
GFR, EST AFRICAN AMERICAN: 32 mL/min — AB (ref >90)
GFR, EST NON AFRICAN AMERICAN: 28 mL/min — AB (ref >90)
GFR, EST NON AFRICAN AMERICAN: 27 mL/min — AB (ref >90)
GLUCOSE: 106 mg/dL — AB (ref 70–99)
Glucose, Bld: 126 mg/dL — ABNORMAL HIGH (ref 70–99)
POTASSIUM: 3.4 mmol/L — AB (ref 3.5–5.1)
POTASSIUM: 3.5 mmol/L (ref 3.5–5.1)
Phosphorus: 2.1 mg/dL — ABNORMAL LOW (ref 2.3–4.6)
Phosphorus: 2.2 mg/dL — ABNORMAL LOW (ref 2.3–4.6)
Sodium: 135 mmol/L (ref 135–145)
Sodium: 136 mmol/L (ref 135–145)

## 2014-07-04 LAB — MAGNESIUM: Magnesium: 2.2 mg/dL (ref 1.5–2.5)

## 2014-07-04 LAB — POCT I-STAT 3, ART BLOOD GAS (G3+)
Acid-base deficit: 1 mmol/L (ref 0.0–2.0)
Bicarbonate: 24.1 mEq/L — ABNORMAL HIGH (ref 20.0–24.0)
O2 Saturation: 96 %
Patient temperature: 97.7
TCO2: 25 mmol/L (ref 0–100)
pCO2 arterial: 40.9 mmHg (ref 35.0–45.0)
pH, Arterial: 7.376 (ref 7.350–7.450)
pO2, Arterial: 86 mmHg (ref 80.0–100.0)

## 2014-07-04 LAB — GLUCOSE, CAPILLARY
GLUCOSE-CAPILLARY: 121 mg/dL — AB (ref 70–99)
Glucose-Capillary: 122 mg/dL — ABNORMAL HIGH (ref 70–99)
Glucose-Capillary: 84 mg/dL (ref 70–99)

## 2014-07-04 LAB — APTT: aPTT: 61 seconds — ABNORMAL HIGH (ref 24–37)

## 2014-07-04 MED ORDER — VANCOMYCIN HCL 500 MG IV SOLR
500.0000 mg | Freq: Four times a day (QID) | Status: DC
  Administered 2014-07-04 – 2014-07-07 (×11): 500 mg via RECTAL
  Filled 2014-07-04 (×21): qty 500

## 2014-07-04 MED ORDER — HYDROCORTISONE NA SUCCINATE PF 100 MG IJ SOLR
50.0000 mg | Freq: Four times a day (QID) | INTRAMUSCULAR | Status: DC
  Administered 2014-07-04 – 2014-07-08 (×14): 50 mg via INTRAVENOUS
  Filled 2014-07-04 (×6): qty 1
  Filled 2014-07-04 (×2): qty 2
  Filled 2014-07-04 (×12): qty 1

## 2014-07-04 NOTE — Progress Notes (Signed)
SUBJECTIVE:  lethargic  OBJECTIVE:   Vitals:   Filed Vitals:   07/04/14 0700 07/04/14 0736 07/04/14 0800 07/04/14 0900  BP: 102/73  95/51 90/52  Pulse: 59  73 72  Temp:  97.8 F (36.6 C)    TempSrc:  Oral Oral Oral  Resp: 17  23 18   Height:      Weight:      SpO2: 99%  95% 97%   I&O's:   Intake/Output Summary (Last 24 hours) at 07/04/14 1001 Last data filed at 07/04/14 0900  Gross per 24 hour  Intake 1041.8 ml  Output   2214 ml  Net -1172.2 ml   TELEMETRY: Reviewed telemetry pt in NSR:     PHYSICAL EXAM General: thin frail, in no acute distress  Lungs:   Clear bilaterally to auscultation and percussion. Heart:   HRRR S1 S2 Pulses are 2+ & equal. Abdomen: Bowel sounds are positive, abdomen soft and non-tender without masses Extremities:   Bilateral BKA  LABS: Basic Metabolic Panel:  Recent Labs  07/03/14 0500 07/03/14 1600 07/04/14 0500  NA 134* 134* 135  K 3.5 3.5 3.4*  CL 103 101 103  CO2 25 23 26   GLUCOSE 108* 122* 126*  BUN 11 10 10   CREATININE 2.66* 2.40* 2.29*  CALCIUM 7.9* 7.6* 7.5*  MG 2.1  --  2.2  PHOS 1.6* 2.6 2.1*   Liver Function Tests:  Recent Labs  07/03/14 1600 07/04/14 0500  ALBUMIN 2.5* 2.3*   No results for input(s): LIPASE, AMYLASE in the last 72 hours. CBC:  Recent Labs  07/03/14 0500  WBC 3.9*  HGB 11.0*  HCT 33.9*  MCV 86.0  PLT 48*   Cardiac Enzymes: No results for input(s): CKTOTAL, CKMB, CKMBINDEX, TROPONINI in the last 72 hours. BNP: Invalid input(s): POCBNP D-Dimer: No results for input(s): DDIMER in the last 72 hours. Hemoglobin A1C: No results for input(s): HGBA1C in the last 72 hours. Fasting Lipid Panel: No results for input(s): CHOL, HDL, LDLCALC, TRIG, CHOLHDL, LDLDIRECT in the last 72 hours. Thyroid Function Tests: No results for input(s): TSH, T4TOTAL, T3FREE, THYROIDAB in the last 72 hours.  Invalid input(s): FREET3 Anemia Panel: No results for input(s): VITAMINB12, FOLATE, FERRITIN, TIBC,  IRON, RETICCTPCT in the last 72 hours. Coag Panel:   Lab Results  Component Value Date   INR 2.26* 07/01/2014   INR 2.47* 06/30/2014   INR 2.38* 06/29/2014    RADIOLOGY: Dg Chest 2 View  06/28/2014   CLINICAL DATA:  Syncope  EXAM: CHEST  2 VIEW  COMPARISON:  03/25/2010  FINDINGS: There are intact appearances of the transvenous leads. There is unchanged moderate cardiomegaly. There is mild interstitial thickening and central vascular congestion which is new from 2011. This likely represents a degree of congestive heart failure. No confluent alveolar opacities are evident. No effusions are evident.  IMPRESSION: Probable mild congestive heart failure.   Electronically Signed   By: Andreas Newport M.D.   On: 06/28/2014 22:01   Dg Abd 1 View  07/02/2014   CLINICAL DATA:  Abdominal distension.  EXAM: ABDOMEN - 1 VIEW  COMPARISON:  None.  FINDINGS: Moderate distention of the stomach with error. No bowel distention is seen to suggest obstruction. Soft tissues are poorly defined. No convincing renal or ureteral stones. There are vascular calcifications along the aorta and its branch vessels.  IMPRESSION: 1. No evidence of bowel obstruction. Moderate distention of the stomach, nonspecific.   Electronically Signed   By: Dedra Skeens.D.  On: 07/02/2014 14:53   Ct Head Wo Contrast  06/28/2014   CLINICAL DATA:  Two episodes of syncope today  EXAM: CT HEAD WITHOUT CONTRAST  TECHNIQUE: Contiguous axial images were obtained from the base of the skull through the vertex without intravenous contrast.  COMPARISON:  None.  FINDINGS: The ventricles are normal in size and configuration. There is no intracranial mass, hemorrhage, extra-axial fluid collection, or midline shift. There is slight small vessel disease in the centra semiovale bilaterally. Elsewhere, gray-white compartments appear normal. No acute infarct apparent. The bony calvarium appears intact. The mastoid air cells are clear. There is a small air-fluid  level in the right maxillary antrum with a a retention cyst along the anterior inferior right maxillary antrum. There is rightward deviation of the nasal septum.  IMPRESSION: Mild periventricular small vessel disease. No intracranial mass, hemorrhage, or acute appearing infarct. Evidence of right maxillary sinus disease.   Electronically Signed   By: Lowella Grip III M.D.   On: 06/28/2014 21:30   Dg Chest Port 1 View  06/30/2014   CLINICAL DATA:  Left-sided hemodialysis catheter placement  EXAM: PORTABLE CHEST - 1 VIEW  COMPARISON:  06/29/2014  FINDINGS: New left IJ approach dual lumen dialysis catheter has been placed with tip over the mid SVC. No pneumothorax. Right IJ line has been removed. No significant change otherwise. Gaseous gastric distention is partly visualized.  IMPRESSION: Support apparatus as above.   Electronically Signed   By: Conchita Paris M.D.   On: 06/30/2014 14:25   Dg Chest Portable 1 View  06/29/2014   CLINICAL DATA:  Central line placement.  Subsequent evaluation.  EXAM: PORTABLE CHEST - 1 VIEW  COMPARISON:  Chest radiograph June 28, 2014  FINDINGS: The cardiac silhouette appears at least mild apparently enlarged coming with considerations low inspiratory examination. Cauda vascular markings without pleural effusion or focal consolidation. Calcified aortic knob. Strandy densities LEFT lung base.  Interval placement RIGHT internal jugular central venous catheter with distal tip projecting cavoatrial junction. No pneumothorax. Four lead LEFT cardiac defibrillator in situ. Soft tissue planes and included osseous structures are nonsuspicious. Moderate degenerative change of the thoracic spine.  IMPRESSION: RIGHT internal jugular central venous catheter distal tip projects cavoatrial junction. No pneumothorax.  Cardiomegaly.  LEFT lung base atelectasis/scar.   Electronically Signed   By: Elon Alas   On: 06/29/2014 02:05    ASSESSMENT: Kathyrn Lass: Encephelopathy  1)  NSTEMI: High risk for CAD with PAD and ESRD. Ischemic cardiomyopathy, low EF 20%. He is still hypotensive on CVVHD requiring pressors. Hypotension worse when fluid is removed. Not currently a candidate for cath.   2) Still requiring some Levophed for hypotension, worse when fluid removal is happening.  3) Palliative being consulted for goals of care. Not much to offer from a cardiac standpoint unless he improves significantly from his other medical issues. No cardiac sx at this time.    [name], MD  07/04/2014  10:01 AM

## 2014-07-04 NOTE — Progress Notes (Signed)
[place] Progress Note Patient Name: Clayton [name] DOB: July 04, 1945 MRN: [id number]   Date of Service  07/04/2014  HPI/Events of Note  Can't take po vanc, getting vanc enemas   eICU Interventions  Ok to hold po vance, consider NG placement 2/21 if needed      Intervention Category Intermediate Interventions: Infection - evaluation and management  Christinia Gully 07/04/2014, 8:18 PM

## 2014-07-04 NOTE — Progress Notes (Signed)
Speech Language Pathology Treatment: Dysphagia  Patient Details Name: Clayton [name] MRN: [id number] DOB: Apr 26, 1946 Today's Date: 07/04/2014 Time: [id number] SLP Time Calculation (min) (ACUTE ONLY): 13 min  Assessment / Plan / Recommendation Clinical Impression  Patient was seen for follow for therapeutic diet tolerance.  Discussion with nurse indicates that the patient appeared to do well with his breakfast tray but issues were noted with his lunch tray.  The nurse reports that the patient has been coughing with all textures and is requiring max cue to swallow.  No temperature spikes have been noted and nursing reports chest is clear but diminished in the bases.  The patient presented with the same symptoms during the meal observation.  The patient demonstrated multiple swallows to clear all boluses with delayed cough on all tested consistencies (i.e. Thin, nectar and puree).  In addition, the patient appeared to have poor oral prep and delayed a/p transport and required cueing to swallow.  Rx keep the patient NPO pending MBS results.  Will attempt to complete MBS next date.     HPI HPI: 69 yo male with multiple medical problems who presented with several week history of diarrhea, syncopal episode. Found to have NSTEMI, persistent hypotension, c-diff. RN reports difficulty swallowing.    Pertinent Vitals    SLP Plan  MBS    Recommendations Diet recommendations: NPO Medication Administration: Via alternative means              Plan: MBS    GO     Lamar Sprinkles 07/04/2014, 2:49 PM  Shelly Flatten, [place], [place] [phone]

## 2014-07-04 NOTE — Progress Notes (Signed)
PULMONARY / CRITICAL CARE MEDICINE   Name: Clayton [name] MRN: [id number] DOB: [birth year]    ADMISSION DATE:  06/28/2014  REFERRING MD :  EDP   CHIEF COMPLAINT:  Hypotension   INITIAL PRESENTATION: 69 yo male with hx ESRD, CHF, DM, CAD, bilat BKA who receives all his care at [place].  He presented 2/14 after a fall at home with syncope.  He has had diarrhea x 2-3 weeks with negative GI w/u thus far at [facility] (was scheduled for upper/lower scope next week).  In ER was significantly hypotensive with SBP 60's, hypokalemic with K 2.4.  No sig fluid resuscitation given r/t ESRD and CHF.  PCCM called to admit.   STUDIES:  CT head 2/14>>> neg acute  2D echo 2/14>>> LVEF is severely depressed at approximately 20% with hypokinesis of the inferiore wall (base, mid), anterior wall (base, minimally mid)m basal lateral wall;, basal posterior wall; akinesis elsewhere.  SIGNIFICANT EVENTS: 2/14 >> Hypotension s/p 1L fluids - Neo &Dopa started 2/15 >> soft BP persist; On Neo;  2/16 >> Midodrine started by Renal; CRRT started; Neo switched to levo 2/17 >> C diff +; Vanc/Flagyl started  SUBJECTIVE: Sleeping. Awakes to name.   VITAL SIGNS: Temp:  [97.4 F (36.3 C)-97.8 F (36.6 C)] 97.8 F (36.6 C) (02/20 0329) Pulse Rate:  [59-102] 59 (02/20 0700) Resp:  [14-33] 17 (02/20 0700) BP: (80-137)/(48-97) 102/73 mmHg (02/20 0700) SpO2:  [91 %-100 %] 99 % (02/20 0700) Weight:  [81.4 kg (179 lb 7.3 oz)] 81.4 kg (179 lb 7.3 oz) (02/20 0420) HEMODYNAMICS:   VENTILATOR SETTINGS:   INTAKE / OUTPUT:  Intake/Output Summary (Last 24 hours) at 07/04/14 0723 Last data filed at 07/04/14 0700  Gross per 24 hour  Intake  953.2 ml  Output   2273 ml  Net -1319.8 ml    PHYSICAL EXAMINATION: General:  Chronically ill appearing male, NAD, but slow to awaken Neuro:  lethargic; Mumbles words. Follows simple commands oriented X1 at best  HEENT:  Mm dry; no JVD  Cardiovascular:  s1s2 irreg; murmur present  Lungs:  decreased. Does have periods of apnea  Abdomen:  Soft, +bs, non tender  Musculoskeletal:  bilat BKA   LABS:  CBC  Recent Labs Lab 06/30/14 0830 07/01/14 0444 07/03/14 0500  WBC 6.6 5.9 3.9*  HGB 12.4* 11.7* 11.0*  HCT 39.0 36.2* 33.9*  PLT 75* 78* 48*   Coag's  Recent Labs Lab 06/29/14 0609 06/30/14 0540  07/01/14 0444 07/02/14 0500 07/03/14 0500 07/04/14 0500  APTT  --   --   < > 52* 55* 54* 61*  INR 2.38* 2.47*  --  2.26*  --   --   --   < > = values in this interval not displayed. BMET  Recent Labs Lab 07/03/14 0500 07/03/14 1600 07/04/14 0500  NA 134* 134* 135  K 3.5 3.5 3.4*  CL 103 101 103  CO2 25 23 26   BUN 11 10 10   CREATININE 2.66* 2.40* 2.29*  GLUCOSE 108* 122* 126*   Electrolytes  Recent Labs Lab 07/02/14 0500  07/03/14 0500 07/03/14 1600 07/04/14 0500  CALCIUM 8.0*  < > 7.9* 7.6* 7.5*  MG 2.1  --  2.1  --  2.2  PHOS 1.7*  < > 1.6* 2.6 2.1*  < > = values in this interval not displayed. Sepsis Markers  Recent Labs Lab 06/29/14 1629 06/30/14 0830 07/01/14 0447  LATICACIDVEN 2.7* 3.9* 2.0   ABG No results for input(s): PHART, PCO2ART,  PO2ART in the last 168 hours. Liver Enzymes  Recent Labs Lab 06/28/14 2053  06/30/14 0830  07/03/14 0500 07/03/14 1600 07/04/14 0500  AST 31  --  32  --   --   --   --   ALT 10  --  13  --   --   --   --   ALKPHOS 123*  --  106  --   --   --   --   BILITOT 1.5*  --  1.8*  --   --   --   --   ALBUMIN 2.9*  < > 2.5*  < > 2.6* 2.5* 2.3*  < > = values in this interval not displayed. Cardiac Enzymes  Recent Labs Lab 06/29/14 1100 06/30/14 0540 07/01/14 0444  TROPONINI 3.16* 4.13* 2.35*   Glucose  Recent Labs Lab 07/02/14 1753 07/02/14 2155 07/03/14 0737 07/03/14 1245 07/03/14 1550 07/03/14 2237  GLUCAP 94 158* 191* 74 89 108*    Imaging No results found.  ASSESSMENT / PLAN:  PULMONARY Pulmonary HTN Central sleep apnea witnessed  P:   Supplemental O2 as needed  IS as  able  CARDIOVASCULAR CVL  2/14>>> CAD; HFrEF (20%)  DM Hypotension likely 2/2 hypovolemia in setting diarrhea and end stage HF Low BP at baseline HTN  Pacemaker/ AICD NSTEMI [facility]. Unstable for cath P:  KVO fluids  Midodrine started 2/16 Pressors: Levo SBP goal >90 CVP: elevated - Removing fluid by CRRT as able Pressors remain, obtain coritsol, then stress roids  RENAL ESRD - MWF HD - did get full HD Friday without difficulty  Hypokalemia  P:   Renal following; CRRT   GASTROINTESTINAL Diarrhea - ongoing x 3weeks.  Denies abd pain, nausea. C diff + 2/17 P:   pepcid  zofran PRN  Renal diet; Nepro shakes  HEMATOLOGIC Thrombocytopenia  Leukopenia  P:  F/u CBC: Plts 48 (2/19) -->f/u in am  No current DVT prophylaxis given plts low and bilateral BKA's   INFECTIOUS ?enteritis - C diff + 2/17 P:   BCx >> NGTD Vanc 2/15 >> 2/17 day 3/3 Zosyn 2/15 >>2/17  Day 3/3  Oral Vanc 2/17 >> Day 4/10-14 Metronidazole 2/17 >> 4/10-14  Remain in shock, consider vanc enema Ensure on high dose oral vanc  ENDOCRINE DM  P:   SSI  NEUROLOGIC Syncope - suspect r/t significant hypotension/ volume depletion- Resolved CT head negative Hypoactive delirium  P:   Supportive care   FAMILY  - Updates: updated at bedside 2/15  - Inter-disciplinary family meet or Palliative Care meeting completed 2/18  NP SUMMARY No sig change. This is a unfortunate gentlemen w/ ESRD admitted w/ CDIFF, NSTEMI and worsening CM. He has remained pressor dependent, seems weaker, remains encephalopathic. Nothing to add to treatment today. We need to discuss goals of care further, he seems like a very poor candidate for life support in hopes that he would improve.    Erick Colace, NP 07/04/2014, 7:23 AM    STAFF NOTE: Linwood Dibbles, MD FACP have personally reviewed patient's available data, including medical history, events  of note, physical examination and test results as part of my evaluation. I have discussed with resident/NP and other care providers such as pharmacist, RN and RRT. In addition, I personally evaluated patient and elicited key findings of: remains on pressors, add vanc enema, continued high dose oral vanc flagyl, cortisol now, then stress roids, more awake, cvvhd on  going The patient is critically ill with multiple organ systems failure and requires high complexity decision making for assessment and support, frequent evaluation and titration of therapies, application of advanced monitoring technologies and extensive interpretation of multiple databases.   Critical Care Time devoted to patient care services described in this note is30  Minutes. This time reflects time of care of this signee: Merrie Roof, MD FACP. This critical care time does not reflect procedure time, or teaching time or supervisory time of PA/NP/Med student/Med Resident etc but could involve care discussion time. Rest per NP/medical resident whose note is outlined above and that I agree with   Lavon Paganini. [name], MD, [place] Pgr: [facility] 07/04/2014 12:10 PM

## 2014-07-04 NOTE — Progress Notes (Signed)
Subjective:  CRRT running pretty well.  He needed haldol last night- seems more confused to me this AM- still on moderate dose pressors Objective Vital signs in last 24 hours: Filed Vitals:   07/04/14 0329 07/04/14 0400 07/04/14 0420 07/04/14 0500  BP:  80/48  96/61  Pulse:  68  65  Temp: 97.8 F (36.6 C)     TempSrc: Oral     Resp:  20  18  Height:      Weight:   81.4 kg (179 lb 7.3 oz)   SpO2:  99%  99%   Weight change:   Intake/Output Summary (Last 24 hours) at 07/04/14 [id number] Last data filed at 07/04/14 0600  Gross per 24 hour  Intake    905 ml  Output   2206 ml  Net  -1301 ml   Dialyzes at [facility] ([id number]) [id number] MWF EDW 81. HD Bath 2.5 calc, 2.0 K , Dialyzer 180, Heparin none. Access left AVF. Hectorol 2 mcg q HD, no ESA , no heparin  Assessment/Plan: 69 year old BM with multiple medical issues including ESRD MWF at [facility] presents with diarrhea/syncope 1 syncope/hypotension-  concern for cardiogenic hypotension with new decreased EF and elevated troponin- cards on board, vs sepsis due to C diff. Supportive care initially with  volume but no success getting him off pressors, now CRRT.   Pressor dose stable to up.  Hopeful will eventually be able to come off pressors otherwise not sure what end point might be.  Palliative care has been consulted- family still hoping for complete recovery - using midodrine 2 ESRD: normally MWF via AVF - no regular HD since Friday 2/12- CRRT started 2/16 via vascath - no heparin , all dialysate- running pretty well- no need to initiate heparin it seems at this time 3. Anemia of ESRD: this does not seem to be a significant issue for the patient , he has not needed ESA as OP 4. Metabolic Bone Disease: will hold home meds of hectorol 2, sensipar 30 daily.  Normally on  renagel/renvela- his phos is low, no binders, repleting as needed- not today  5. Hypokalemia- repleted and 4 K bath 6. Diarrhea- C diff positive- vanc and flagyl- not sure how  much is contributing to overall picture as is something that should improve with treatment   Clayton [name] A    Labs: Basic Metabolic Panel:  Recent Labs Lab 07/02/14 1500 07/03/14 0500 07/03/14 1600  NA 135 134* 134*  K 3.5 3.5 3.5  CL 101 103 101  CO2 24 25 23   GLUCOSE 127* 108* 122*  BUN 14 11 10   CREATININE 3.41* 2.66* 2.40*  CALCIUM 7.9* 7.9* 7.6*  PHOS 2.2* 1.6* 2.6   Liver Function Tests:  Recent Labs Lab 06/28/14 2053  06/30/14 0830  07/02/14 1500 07/03/14 0500 07/03/14 1600  AST 31  --  32  --   --   --   --   ALT 10  --  13  --   --   --   --   ALKPHOS 123*  --  106  --   --   --   --   BILITOT 1.5*  --  1.8*  --   --   --   --   PROT 7.5  --  6.5  --   --   --   --   ALBUMIN 2.9*  < > 2.5*  < > 2.5* 2.6* 2.5*  < > = values in  this interval not displayed.  Recent Labs Lab 06/28/14 2053 06/28/14 2350  LIPASE 25  --   AMYLASE  --  47   No results for input(s): AMMONIA in the last 168 hours. CBC:  Recent Labs Lab 06/28/14 2053  06/29/14 0405 06/30/14 0830 07/01/14 0444 07/03/14 0500  WBC 3.2*  --  5.2 6.6 5.9 3.9*  NEUTROABS 2.2  --   --  4.7  --   --   HGB 12.7*  < > 12.4* 12.4* 11.7* 11.0*  HCT 40.1  < > 38.7* 39.0 36.2* 33.9*  MCV 89.7  --  89.6 88.2 86.6 86.0  PLT 79*  --  73* 75* 78* 48*  < > = values in this interval not displayed. Cardiac Enzymes:  Recent Labs Lab 06/28/14 2350 06/29/14 0405 06/29/14 1100 06/30/14 0540 07/01/14 0444  TROPONINI 0.47* 1.16* 3.16* 4.13* 2.35*   CBG:  Recent Labs Lab 07/02/14 2155 07/03/14 0737 07/03/14 1245 07/03/14 1550 07/03/14 2237  GLUCAP 158* 191* 74 89 108*    Iron Studies: No results for input(s): IRON, TIBC, TRANSFERRIN, FERRITIN in the last 72 hours. Studies/Results: Dg Abd 1 View  07/02/2014   CLINICAL DATA:  Abdominal distension.  EXAM: ABDOMEN - 1 VIEW  COMPARISON:  None.  FINDINGS: Moderate distention of the stomach with error. No bowel distention is seen to  suggest obstruction. Soft tissues are poorly defined. No convincing renal or ureteral stones. There are vascular calcifications along the aorta and its branch vessels.  IMPRESSION: 1. No evidence of bowel obstruction. Moderate distention of the stomach, nonspecific.   Electronically Signed   By: Lajean Manes M.D.   On: 07/02/2014 14:53   Medications: Infusions: . dextrose 5 % and 0.9% NaCl 10 mL/hr at 07/03/14 0700  . norepinephrine (LEVOPHED) Adult infusion 15 mcg/min (07/03/14 2104)  . dialysis replacement fluid (prismasate) 300 mL/hr at 07/04/14 0148  . dialysis replacement fluid (prismasate) 300 mL/hr at 07/04/14 0428  . dialysate (PRISMASATE) 1,000 mL/hr at 07/04/14 0428    Scheduled Medications: . antiseptic oral rinse  7 mL Mouth Rinse BID  . cinacalcet  30 mg Oral Q breakfast  . famotidine (PEPCID) IV  20 mg Intravenous Q24H  . feeding supplement (NEPRO CARB STEADY)  237 mL Oral TID BM  . insulin aspart  0-9 Units Subcutaneous TID WC  . vancomycin  500 mg Oral 4 times per day   And  . metronidazole  500 mg Intravenous Q8H  . midodrine  10 mg Oral BID WC    have reviewed scheduled and prn medications.  Physical Exam: General: resting- left neck vascath placed 2/16 Heart: RRR Lungs: mostly clear- but with some decreased BS at bases Abdomen: soft, non tender Extremities: dependent pitting edema Dialysis Access: left AVF - patent    07/04/2014,7:02 AM  LOS: 6 days

## 2014-07-05 LAB — CBC
HEMATOCRIT: 34 % — AB (ref 39.0–52.0)
Hemoglobin: 11.1 g/dL — ABNORMAL LOW (ref 13.0–17.0)
MCH: 27.6 pg (ref 26.0–34.0)
MCHC: 32.6 g/dL (ref 30.0–36.0)
MCV: 84.6 fL (ref 78.0–100.0)
PLATELETS: 57 10*3/uL — AB (ref 150–400)
RBC: 4.02 MIL/uL — AB (ref 4.22–5.81)
RDW: 22.2 % — ABNORMAL HIGH (ref 11.5–15.5)
WBC: 6.1 10*3/uL (ref 4.0–10.5)

## 2014-07-05 LAB — RENAL FUNCTION PANEL
ANION GAP: 7 (ref 5–15)
Albumin: 2.5 g/dL — ABNORMAL LOW (ref 3.5–5.2)
Albumin: 2.3 g/dL — ABNORMAL LOW (ref 3.5–5.2)
Anion gap: 8 (ref 5–15)
BUN: 9 mg/dL (ref 6–23)
BUN: 10 mg/dL (ref 6–23)
CALCIUM: 7.9 mg/dL — AB (ref 8.4–10.5)
CALCIUM: 7.7 mg/dL — AB (ref 8.4–10.5)
CHLORIDE: 101 mmol/L (ref 96–112)
CHLORIDE: 103 mmol/L (ref 96–112)
CO2: 28 mmol/L (ref 19–32)
CO2: 26 mmol/L (ref 19–32)
CREATININE: 2.25 mg/dL — AB (ref 0.50–1.35)
CREATININE: 2.01 mg/dL — AB (ref 0.50–1.35)
GFR calc Af Amer: 33 mL/min — ABNORMAL LOW (ref >90)
GFR calc Af Amer: 38 mL/min — ABNORMAL LOW (ref >90)
GFR calc non Af Amer: 33 mL/min — ABNORMAL LOW (ref >90)
GFR, EST NON AFRICAN AMERICAN: 28 mL/min — AB (ref >90)
Glucose, Bld: 135 mg/dL — ABNORMAL HIGH (ref 70–99)
Glucose, Bld: 154 mg/dL — ABNORMAL HIGH (ref 70–99)
PHOSPHORUS: 2.4 mg/dL (ref 2.3–4.6)
PHOSPHORUS: 2.1 mg/dL — AB (ref 2.3–4.6)
Potassium: 4.3 mmol/L (ref 3.5–5.1)
Potassium: 3.9 mmol/L (ref 3.5–5.1)
SODIUM: 137 mmol/L (ref 135–145)
Sodium: 136 mmol/L (ref 135–145)

## 2014-07-05 LAB — CORTISOL: Cortisol, Plasma: 13.7 ug/dL

## 2014-07-05 LAB — GLUCOSE, CAPILLARY
GLUCOSE-CAPILLARY: 119 mg/dL — AB (ref 70–99)
Glucose-Capillary: 108 mg/dL — ABNORMAL HIGH (ref 70–99)
Glucose-Capillary: 117 mg/dL — ABNORMAL HIGH (ref 70–99)
Glucose-Capillary: 121 mg/dL — ABNORMAL HIGH (ref 70–99)
Glucose-Capillary: 127 mg/dL — ABNORMAL HIGH (ref 70–99)
Glucose-Capillary: 135 mg/dL — ABNORMAL HIGH (ref 70–99)

## 2014-07-05 LAB — APTT: APTT: 49 s — AB (ref 24–37)

## 2014-07-05 LAB — MAGNESIUM: Magnesium: 2.4 mg/dL (ref 1.5–2.5)

## 2014-07-05 MED ORDER — VANCOMYCIN 50 MG/ML ORAL SOLUTION
500.0000 mg | Freq: Four times a day (QID) | ORAL | Status: DC
  Administered 2014-07-05 – 2014-07-10 (×23): 500 mg via ORAL
  Filled 2014-07-05 (×26): qty 10

## 2014-07-05 NOTE — Progress Notes (Signed)
Subjective:  CRRT running pretty well.  MS waxing and waning- still on moderate dose pressors Objective Vital signs in last 24 hours: Filed Vitals:   07/05/14 0400 07/05/14 0437 07/05/14 0500 07/05/14 0600  BP: 96/61  93/61 108/67  Pulse: 51   104  Temp:  97.1 F (36.2 C)    TempSrc:  Oral    Resp: 22  20 23   Height:      Weight:   79.3 kg (174 lb 13.2 oz)   SpO2: 100%  98% 89%   Weight change: -2.1 kg (-4 lb 10.1 oz)  Intake/Output Summary (Last 24 hours) at 07/05/14 0732 Last data filed at 07/05/14 0600  Gross per 24 hour  Intake  754.3 ml  Output   1878 ml  Net -1123.7 ml   Dialyzes at [facility] ([id number]) [id number] MWF EDW 81. HD Bath 2.5 calc, 2.0 K , Dialyzer 180, Heparin none. Access left AVF. Hectorol 2 mcg q HD, no ESA , no heparin  Assessment/Plan: 69 year old BM with multiple medical issues including ESRD MWF at [facility] presents with diarrhea/syncope 1 syncope/hypotension-  concern for cardiogenic hypotension with new decreased EF and elevated troponin- cards on board, vs sepsis due to C diff. Supportive care initially with  volume but no success getting him off pressors, now CRRT.   Pressor dose stable to up.  Hopeful will eventually be able to come off pressors otherwise not sure what end point might be.  Palliative care has been consulted- family still hoping for complete recovery - using midodrine 2 ESRD: normally MWF via AVF - no regular HD since Friday 2/12- CRRT started 2/16 via vascath - no heparin , all dialysate- running pretty well- no need to initiate heparin it seems at this time 3. Anemia of ESRD: this does not seem to be [name] significant issue for the patient , he has not needed ESA as OP 4. Metabolic Bone Disease: will hold home meds of hectorol 2, sensipar 30 daily and   renagel/renvela- his phos is low, repleting as needed 5. Hypokalemia- repleted and 4 K bath 6. Diarrhea- C diff positive- vanc and flagyl- not sure how much is contributing to overall  picture as is something that should improve with treatment   Clayton [name]    Labs: Basic Metabolic Panel:  Recent Labs Lab 07/04/14 0500 07/04/14 1600 07/05/14 0330  NA 135 136 137  K 3.4* 3.5 4.3  CL 103 103 101  CO2 26 25 28   GLUCOSE 126* 106* 135*  BUN 10 11 9   CREATININE 2.29* 2.38* 2.25*  CALCIUM 7.5* 7.7* 7.9*  PHOS 2.1* 2.2* 2.4   Liver Function Tests:  Recent Labs Lab 06/28/14 2053  06/30/14 0830  07/04/14 0500 07/04/14 1600 07/05/14 0330  AST 31  --  32  --   --   --   --   ALT 10  --  13  --   --   --   --   ALKPHOS 123*  --  106  --   --   --   --   BILITOT 1.5*  --  1.8*  --   --   --   --   PROT 7.5  --  6.5  --   --   --   --   ALBUMIN 2.9*  < > 2.5*  < > 2.3* 2.3* 2.5*  < > = values in this interval not displayed.  Recent Labs Lab 06/28/14 2053 06/28/14 2350  LIPASE 25  --   AMYLASE  --  47   No results for input(s): AMMONIA in the last 168 hours. CBC:  Recent Labs Lab 06/28/14 2053  06/29/14 0405 06/30/14 0830 07/01/14 0444 07/03/14 0500 07/05/14 0330  WBC 3.2*  --  5.2 6.6 5.9 3.9* 6.1  NEUTROABS 2.2  --   --  4.7  --   --   --   HGB 12.7*  < > 12.4* 12.4* 11.7* 11.0* 11.1*  HCT 40.1  < > 38.7* 39.0 36.2* 33.9* 34.0*  MCV 89.7  --  89.6 88.2 86.6 86.0 84.6  PLT 79*  --  73* 75* 78* 48* 57*  < > = values in this interval not displayed. Cardiac Enzymes:  Recent Labs Lab 06/28/14 2350 06/29/14 0405 06/29/14 1100 06/30/14 0540 07/01/14 0444  TROPONINI 0.47* 1.16* 3.16* 4.13* 2.35*   CBG:  Recent Labs Lab 07/04/14 0703 07/04/14 1130 07/04/14 1528 07/05/14 0048 07/05/14 0427  GLUCAP 121* 122* 84 117* 108*    Iron Studies: No results for input(s): IRON, TIBC, TRANSFERRIN, FERRITIN in the last 72 hours. Studies/Results: No results found. Medications: Infusions: . dextrose 5 % and 0.9% NaCl 10 mL/hr at 07/03/14 0700  . norepinephrine (LEVOPHED) Adult infusion 12 mcg/min (07/05/14 0626)  . dialysis  replacement fluid (prismasate) 300 mL/hr at 07/04/14 0148  . dialysis replacement fluid (prismasate) 300 mL/hr at 07/04/14 1948  . dialysate (PRISMASATE) 1,000 mL/hr at 07/05/14 0630    Scheduled Medications: . antiseptic oral rinse  7 mL Mouth Rinse BID  . famotidine (PEPCID) IV  20 mg Intravenous Q24H  . feeding supplement (NEPRO CARB STEADY)  237 mL Oral TID BM  . hydrocortisone sodium succinate  50 mg Intravenous Q6H  . insulin aspart  0-9 Units Subcutaneous TID WC  . metronidazole  500 mg Intravenous Q8H  . midodrine  10 mg Oral BID WC  . vancomycin (VANCOCIN) rectal ENEMA  500 mg Rectal 4 times per day    have reviewed scheduled and prn medications.  Physical Exam: General: more alert "my blood pressure is down"- left neck vascath placed 2/16 Heart: RRR Lungs: mostly clear- but with some decreased BS at bases Abdomen: soft, non tender Extremities: dependent pitting edema Dialysis Access: left AVF - patent    07/05/2014,7:32 AM  LOS: 7 days

## 2014-07-05 NOTE — Progress Notes (Signed)
PULMONARY / CRITICAL CARE MEDICINE   Name: Clayton [name] MRN: [id number] DOB: 26-Nov-1945    ADMISSION DATE:  06/28/2014  REFERRING MD :  EDP   CHIEF COMPLAINT:  Hypotension   INITIAL PRESENTATION: 69 yo male with hx ESRD, CHF, DM, CAD, bilat BKA who receives all his care at [place].  He presented 2/14 after a fall at home with syncope.  He has had diarrhea x 2-3 weeks with negative GI w/u thus far at [facility] (was scheduled for upper/lower scope next week).  In ER was significantly hypotensive with SBP 60's, hypokalemic with K 2.4.  No sig fluid resuscitation given r/t ESRD and CHF.  PCCM called to admit.   STUDIES:  CT head 2/14>>> neg acute  2D echo 2/14>>> LVEF is severely depressed at approximately 20% with hypokinesis of the inferiore wall (base, mid), anterior wall (base, minimally mid)m basal lateral wall;, basal posterior wall; akinesis elsewhere.  SIGNIFICANT EVENTS: 2/14 >> Hypotension s/p 1L fluids - Neo &Dopa started 2/15 >> soft BP persist; On Neo;  2/16 >> Midodrine started by Renal; CRRT started; Neo switched to levo 2/17 >> C diff +; Vanc/Flagyl started  SUBJECTIVE: Sleeping. Awakes to name.   VITAL SIGNS: Temp:  [97.1 F (36.2 C)-98.4 F (36.9 C)] 97.1 F (36.2 C) (02/21 0437) Pulse Rate:  [51-110] 104 (02/21 0600) Resp:  [12-28] 23 (02/21 0600) BP: (72-108)/(44-67) 108/67 mmHg (02/21 0600) SpO2:  [71 %-100 %] 89 % (02/21 0600) Weight:  [79.3 kg (174 lb 13.2 oz)] 79.3 kg (174 lb 13.2 oz) (02/21 0500) HEMODYNAMICS:   VENTILATOR SETTINGS:   INTAKE / OUTPUT:  Intake/Output Summary (Last 24 hours) at 07/05/14 0725 Last data filed at 07/05/14 0600  Gross per 24 hour  Intake  754.3 ml  Output   1878 ml  Net -1123.7 ml    PHYSICAL EXAMINATION: General:  Chronically ill appearing male, NAD, more awake. Appropriate  Neuro:  Awake, sp clear . Oriented X 2 HEENT:  Mm dry; no JVD  Cardiovascular:  s1s2 irreg; murmur present  Lungs: decreased. Does have periods of  apnea  Abdomen:  Soft, +bs, non tender  Musculoskeletal:  bilat BKA   LABS:  CBC  Recent Labs Lab 07/01/14 0444 07/03/14 0500 07/05/14 0330  WBC 5.9 3.9* 6.1  HGB 11.7* 11.0* 11.1*  HCT 36.2* 33.9* 34.0*  PLT 78* 48* 57*   Coag's  Recent Labs Lab 06/29/14 0609 06/30/14 0540 07/01/14 0444  07/03/14 0500 07/04/14 0500 07/05/14 0330  APTT  --   --  52*  < > 54* 61* 49*  INR 2.38* 2.47* 2.26*  --   --   --   --   < > = values in this interval not displayed. BMET  Recent Labs Lab 07/04/14 0500 07/04/14 1600 07/05/14 0330  NA 135 136 137  K 3.4* 3.5 4.3  CL 103 103 101  CO2 26 25 28   BUN 10 11 9   CREATININE 2.29* 2.38* 2.25*  GLUCOSE 126* 106* 135*   Electrolytes  Recent Labs Lab 07/03/14 0500  07/04/14 0500 07/04/14 1600 07/05/14 0330  CALCIUM 7.9*  < > 7.5* 7.7* 7.9*  MG 2.1  --  2.2  --  2.4  PHOS 1.6*  < > 2.1* 2.2* 2.4  < > = values in this interval not displayed. Sepsis Markers  Recent Labs Lab 06/29/14 1629 06/30/14 0830 07/01/14 0447  LATICACIDVEN 2.7* 3.9* 2.0   ABG  Recent Labs Lab 07/04/14 1536  PHART 7.376  PCO2ART 40.9  PO2ART 86.0   Liver Enzymes  Recent Labs Lab 06/28/14 2053  06/30/14 0830  07/04/14 0500 07/04/14 1600 07/05/14 0330  AST 31  --  32  --   --   --   --   ALT 10  --  13  --   --   --   --   ALKPHOS 123*  --  106  --   --   --   --   BILITOT 1.5*  --  1.8*  --   --   --   --   ALBUMIN 2.9*  < > 2.5*  < > 2.3* 2.3* 2.5*  < > = values in this interval not displayed. Cardiac Enzymes  Recent Labs Lab 06/29/14 1100 06/30/14 0540 07/01/14 0444  TROPONINI 3.16* 4.13* 2.35*   Glucose  Recent Labs Lab 07/03/14 2237 07/04/14 0703 07/04/14 1130 07/04/14 1528 07/05/14 0048 07/05/14 0427  GLUCAP 108* 121* 122* 84 117* 108*    Imaging No results found.  ASSESSMENT / PLAN:  PULMONARY Pulmonary HTN Central sleep apnea witnessed  P:   Supplemental O2 as needed  IS as able abg reviewed,  improved neurostatus, no need ETT  CARDIOVASCULAR CVL  2/14>>> CAD; HFrEF (20%)  DM Smoldering septic shock from cdiff cardiomyopathy 20% HTN  Pacemaker/ AICD NSTEMI [facility]: [facility] medical management. Unstable for cath P:  KVO fluids  Midodrine started 2/16 Pressors: Levo SBP goal >90 CVP: elevated - Removing fluid by CRRT as able Stress dose steroids remain until off pressors Heparin for crrt Once off pressors, then BB if tolerated  RENAL ESRD - MWF HD - did get full HD Friday without difficulty  Hypokalemia  P:   Renal following; CRRT  Was neg 1.1 liters If pressors rise may need to keep even  GASTROINTESTINAL Diarrhea - ongoing x 3weeks.  Denies abd pain, nausea. C diff + 2/17 P:   pepcid  zofran PRN  Cl diet today 2/21as neuro improved  HEMATOLOGIC Thrombocytopenia -->stable as of 2/21 Leukopenia  P:  F/u CBC No current DVT prophylaxis given plts low and bilateral BKA's   INFECTIOUS PMC P:   BCx >> NGTD Vanc 2/15 >> 2/17 day 3/3 Zosyn 2/15 >>2/17  Day 3/3  Oral Vanc 2/17 >> Day 4/10-14 Metronidazole 2/17 >> 4/10-14 vanc enema 2/20>>> Resume oral vanc 2/21>>>  ENDOCRINE DM  Relative adrenal insuff P:   SSI Stress dose steroids   NEUROLOGIC Syncope - suspect r/t significant hypotension/ volume depletion- Resolved CT head negative Hypoactive delirium  P:   Supportive care   FAMILY  - Updates: updated at bedside 2/15  - Inter-disciplinary family meet or Palliative Care meeting completed 2/18  NP SUMMARY Much more awake. This is a unfortunate gentlemen w/ ESRD admitted w/ CDIFF, NSTEMI and worsening CM. He has remained pressor dependent, but his MS has remarkably improved. ? Could this be d/t the vanc enemas? Will cont current plan of care. He does have abd discomfort so will limit to clears only today.   Erick Colace, NP 07/05/2014, 7:25 AM   07/05/2014 7:25 AM  STAFF NOTE: Linwood Dibbles,  MD FACP have personally reviewed patient's available data, including medical history, events of note, physical examination and test results as part of my evaluation. I have discussed with resident/NP and other care providers such as pharmacist, RN and RRT. In addition, I personally evaluated patient and elicited key findings of: Improved neurostatus, abdo tender, would  keep vanc enema further, maintain oral vanc and IV flagyl, can restart diet, pressors to MAP goal 60, trop noted, follow plat trend further, if pressors rise, may need to keep even, no need ett, need to discuss code status further The patient is critically ill with multiple organ systems failure and requires high complexity decision making for assessment and support, frequent evaluation and titration of therapies, application of advanced monitoring technologies and extensive interpretation of multiple databases.   Critical Care Time devoted to patient care services described in this note is30 Minutes. This time reflects time of care of this signee: Merrie Roof, MD FACP. This critical care time does not reflect procedure time, or teaching time or supervisory time of PA/NP/Med student/Med Resident etc but could involve care discussion time. Rest per NP/medical resident whose note is outlined above and that I agree with   Lavon Paganini. [name], MD, [place] Pgr: [facility] 07/05/2014 9:07 AM

## 2014-07-05 NOTE — Progress Notes (Signed)
SUBJECTIVE:  Awake and seems confused  OBJECTIVE:   Vitals:   Filed Vitals:   07/05/14 0600 07/05/14 0700 07/05/14 0738 07/05/14 0800  BP: 108/67 93/60  101/63  Pulse: 104 101  102  Temp:   98.5 F (36.9 C)   TempSrc:   Oral Oral  Resp: 23 24  23   Height:      Weight:      SpO2: 89% 99%  100%   I&O's:   Intake/Output Summary (Last 24 hours) at 07/05/14 0858 Last data filed at 07/05/14 0800  Gross per 24 hour  Intake 674.01 ml  Output   2005 ml  Net -1330.99 ml   TELEMETRY: Reviewed telemetry pt in AV paced     PHYSICAL EXAM General: Well developed, well nourished, in no acute distress  Lungs:   Clear bilaterally to auscultation and percussion. Heart:   HRRR S1 S2 Pulses are 2+ & equal. Abdomen: tender to palpation  Extremities:   No clubbing, cyanosis or edema.  DP +1 Neuro: Alert and oriented X 3. Psych:  Good affect, responds appropriately   LABS: Basic Metabolic Panel:  Recent Labs  07/04/14 0500 07/04/14 1600 07/05/14 0330  NA 135 136 137  K 3.4* 3.5 4.3  CL 103 103 101  CO2 26 25 28   GLUCOSE 126* 106* 135*  BUN 10 11 9   CREATININE 2.29* 2.38* 2.25*  CALCIUM 7.5* 7.7* 7.9*  MG 2.2  --  2.4  PHOS 2.1* 2.2* 2.4   Liver Function Tests:  Recent Labs  07/04/14 1600 07/05/14 0330  ALBUMIN 2.3* 2.5*   No results for input(s): LIPASE, AMYLASE in the last 72 hours. CBC:  Recent Labs  07/03/14 0500 07/05/14 0330  WBC 3.9* 6.1  HGB 11.0* 11.1*  HCT 33.9* 34.0*  MCV 86.0 84.6  PLT 48* 57*   Cardiac Enzymes: No results for input(s): CKTOTAL, CKMB, CKMBINDEX, TROPONINI in the last 72 hours. BNP: Invalid input(s): POCBNP D-Dimer: No results for input(s): DDIMER in the last 72 hours. Hemoglobin A1C: No results for input(s): HGBA1C in the last 72 hours. Fasting Lipid Panel: No results for input(s): CHOL, HDL, LDLCALC, TRIG, CHOLHDL, LDLDIRECT in the last 72 hours. Thyroid Function Tests: No results for input(s): TSH, T4TOTAL, T3FREE,  THYROIDAB in the last 72 hours.  Invalid input(s): FREET3 Anemia Panel: No results for input(s): VITAMINB12, FOLATE, FERRITIN, TIBC, IRON, RETICCTPCT in the last 72 hours. Coag Panel:   Lab Results  Component Value Date   INR 2.26* 07/01/2014   INR 2.47* 06/30/2014   INR 2.38* 06/29/2014    RADIOLOGY: Dg Chest 2 View  06/28/2014   CLINICAL DATA:  Syncope  EXAM: CHEST  2 VIEW  COMPARISON:  03/25/2010  FINDINGS: There are intact appearances of the transvenous leads. There is unchanged moderate cardiomegaly. There is mild interstitial thickening and central vascular congestion which is new from 2011. This likely represents a degree of congestive heart failure. No confluent alveolar opacities are evident. No effusions are evident.  IMPRESSION: Probable mild congestive heart failure.   Electronically Signed   By: Andreas Newport M.D.   On: 06/28/2014 22:01   Dg Abd 1 View  07/02/2014   CLINICAL DATA:  Abdominal distension.  EXAM: ABDOMEN - 1 VIEW  COMPARISON:  None.  FINDINGS: Moderate distention of the stomach with error. No bowel distention is seen to suggest obstruction. Soft tissues are poorly defined. No convincing renal or ureteral stones. There are vascular calcifications along the aorta and its branch  vessels.  IMPRESSION: 1. No evidence of bowel obstruction. Moderate distention of the stomach, nonspecific.   Electronically Signed   By: Lajean Manes M.D.   On: 07/02/2014 14:53   Ct Head Wo Contrast  06/28/2014   CLINICAL DATA:  Two episodes of syncope today  EXAM: CT HEAD WITHOUT CONTRAST  TECHNIQUE: Contiguous axial images were obtained from the base of the skull through the vertex without intravenous contrast.  COMPARISON:  None.  FINDINGS: The ventricles are normal in size and configuration. There is no intracranial mass, hemorrhage, extra-axial fluid collection, or midline shift. There is slight small vessel disease in the centra semiovale bilaterally. Elsewhere, gray-white compartments  appear normal. No acute infarct apparent. The bony calvarium appears intact. The mastoid air cells are clear. There is a small air-fluid level in the right maxillary antrum with a a retention cyst along the anterior inferior right maxillary antrum. There is rightward deviation of the nasal septum.  IMPRESSION: Mild periventricular small vessel disease. No intracranial mass, hemorrhage, or acute appearing infarct. Evidence of right maxillary sinus disease.   Electronically Signed   By: Lowella Grip III M.D.   On: 06/28/2014 21:30   Dg Chest Port 1 View  06/30/2014   CLINICAL DATA:  Left-sided hemodialysis catheter placement  EXAM: PORTABLE CHEST - 1 VIEW  COMPARISON:  06/29/2014  FINDINGS: New left IJ approach dual lumen dialysis catheter has been placed with tip over the mid SVC. No pneumothorax. Right IJ line has been removed. No significant change otherwise. Gaseous gastric distention is partly visualized.  IMPRESSION: Support apparatus as above.   Electronically Signed   By: Conchita Paris M.D.   On: 06/30/2014 14:25   Dg Chest Portable 1 View  06/29/2014   CLINICAL DATA:  Central line placement.  Subsequent evaluation.  EXAM: PORTABLE CHEST - 1 VIEW  COMPARISON:  Chest radiograph June 28, 2014  FINDINGS: The cardiac silhouette appears at least mild apparently enlarged coming with considerations low inspiratory examination. Cauda vascular markings without pleural effusion or focal consolidation. Calcified aortic knob. Strandy densities LEFT lung base.  Interval placement RIGHT internal jugular central venous catheter with distal tip projecting cavoatrial junction. No pneumothorax. Four lead LEFT cardiac defibrillator in situ. Soft tissue planes and included osseous structures are nonsuspicious. Moderate degenerative change of the thoracic spine.  IMPRESSION: RIGHT internal jugular central venous catheter distal tip projects cavoatrial junction. No pneumothorax.  Cardiomegaly.  LEFT lung base  atelectasis/scar.   Electronically Signed   By: Elon Alas   On: 06/29/2014 02:05   ASSESSMENT: Kathyrn Lass: Encephelopathy  1) NSTEMI: High risk for CAD with PAD and ESRD. Ischemic cardiomyopathy, low EF 20%. He is still hypotensive on CVVHD requiring pressors. Hypotension worse when fluid is removed. Not currently a candidate for cath.   2)  Sepsis secondary to C diff with severe diarrhea.  Still requiring some Levophed for hypotension, worse when fluid removal is happening.  3) Palliative being consulted for goals of care. Not much to offer from a cardiac standpoint unless he improves significantly from his other medical issues. No cardiac sx at this time.    [name], MD  07/05/2014  8:58 AM

## 2014-07-05 NOTE — Progress Notes (Signed)
Speech Language Pathology Treatment: Dysphagia  Patient Details Name: Clayton [name] MRN: [id number] DOB: 1945-08-06 Today's Date: 07/05/2014 Time: [id number] SLP Time Calculation (min) (ACUTE ONLY): 12 min  Assessment / Plan / Recommendation Clinical Impression  Pt's diet advanced to clear liquids per medical team. SLP provided skilled observation with trials of ice chips and thin liquids. Pt requires Max faded to Mod cues for oral acceptance and initiation of oral manipulation, although pharyngeal phase appears to occur with more automaticity. Multiple swallows noted per bolus although without overt signs of aspiration. Swallow function appears more consistent with initial evaluation. Recommend to continue with clear liquid diet (per MD for abdominal discomfort) with SLP f/u for tolerance and trials of advanced solids as pt is medically ready ready to try more.   HPI HPI: 69 yo male with multiple medical problems who presented with several week history of diarrhea, syncopal episode. Found to have NSTEMI, persistent hypotension, c-diff. RN reports difficulty swallowing.    Pertinent Vitals Pain Assessment: Faces Faces Pain Scale: No hurt  SLP Plan  Continue with current plan of care    Recommendations Diet recommendations: Thin liquid Liquids provided via: Cup;Teaspoon Medication Administration: Crushed with puree Supervision: Staff to assist with self feeding;Full supervision/cueing for compensatory strategies Compensations: Slow rate;Small sips/bites Postural Changes and/or Swallow Maneuvers: Seated upright 90 degrees       Oral Care Recommendations: Oral care BID Follow up Recommendations: Skilled Nursing facility Plan: Continue with current plan of care     Germain Osgood, M.A. CCC-SLP [phone]  Germain Osgood 07/05/2014, 8:53 AM

## 2014-07-06 LAB — CULTURE, BLOOD (ROUTINE X 2)
CULTURE: NO GROWTH
Culture: NO GROWTH

## 2014-07-06 LAB — CBC
HCT: 32.9 % — ABNORMAL LOW (ref 39.0–52.0)
Hemoglobin: 11 g/dL — ABNORMAL LOW (ref 13.0–17.0)
MCH: 28.6 pg (ref 26.0–34.0)
MCHC: 33.4 g/dL (ref 30.0–36.0)
MCV: 85.7 fL (ref 78.0–100.0)
Platelets: 69 10*3/uL — ABNORMAL LOW (ref 150–400)
RBC: 3.84 MIL/uL — ABNORMAL LOW (ref 4.22–5.81)
RDW: 22.4 % — ABNORMAL HIGH (ref 11.5–15.5)
WBC: 6 10*3/uL (ref 4.0–10.5)

## 2014-07-06 LAB — RENAL FUNCTION PANEL
Albumin: 2.5 g/dL — ABNORMAL LOW (ref 3.5–5.2)
Anion gap: 9 (ref 5–15)
BUN: 12 mg/dL (ref 6–23)
CO2: 25 mmol/L (ref 19–32)
Calcium: 7.6 mg/dL — ABNORMAL LOW (ref 8.4–10.5)
Chloride: 101 mmol/L (ref 96–112)
Creatinine, Ser: 2.09 mg/dL — ABNORMAL HIGH (ref 0.50–1.35)
GFR calc Af Amer: 36 mL/min — ABNORMAL LOW (ref >90)
GFR, EST NON AFRICAN AMERICAN: 31 mL/min — AB (ref >90)
Glucose, Bld: 159 mg/dL — ABNORMAL HIGH (ref 70–99)
POTASSIUM: 3.9 mmol/L (ref 3.5–5.1)
Phosphorus: 2.5 mg/dL (ref 2.3–4.6)
Sodium: 135 mmol/L (ref 135–145)

## 2014-07-06 LAB — MAGNESIUM: MAGNESIUM: 2.3 mg/dL (ref 1.5–2.5)

## 2014-07-06 LAB — GLUCOSE, CAPILLARY
GLUCOSE-CAPILLARY: 148 mg/dL — AB (ref 70–99)
GLUCOSE-CAPILLARY: 140 mg/dL — AB (ref 70–99)
GLUCOSE-CAPILLARY: 102 mg/dL — AB (ref 70–99)
Glucose-Capillary: 138 mg/dL — ABNORMAL HIGH (ref 70–99)

## 2014-07-06 LAB — APTT: aPTT: 44 seconds — ABNORMAL HIGH (ref 24–37)

## 2014-07-06 NOTE — Progress Notes (Signed)
SUBJECTIVE:  Lethargic but responsive.  OBJECTIVE:   Vitals:   Filed Vitals:   07/06/14 0800 07/06/14 0900 07/06/14 1000 07/06/14 1100  BP: 100/63 80/54 94/55  99/54  Pulse:  106 87 34  Temp:      TempSrc:      Resp: 10  18 16   Height:      Weight:      SpO2: 99% 96% 98% 100%   I&O's:    Intake/Output Summary (Last 24 hours) at 07/06/14 1150 Last data filed at 07/06/14 1100  Gross per 24 hour  Intake 820.75 ml  Output   1818 ml  Net -997.25 ml   TELEMETRY: Reviewed telemetry pt in NSR, paced     PHYSICAL EXAM General: thin frail, in no acute distress  Lungs:   Clear bilaterally to auscultation and percussion. Heart:   HRRR S1 S2 Pulses are 2+ & equal. Abdomen: Bowel sounds are positive, abdomen soft and non-tender without masses Extremities:   Bilateral BKA  LABS: Basic Metabolic Panel:  Recent Labs  07/05/14 0330 07/05/14 1600 07/06/14 0400  NA 137 136 135  K 4.3 3.9 3.9  CL 101 103 101  CO2 28 26 25   GLUCOSE 135* 154* 159*  BUN 9 10 12   CREATININE 2.25* 2.01* 2.09*  CALCIUM 7.9* 7.7* 7.6*  MG 2.4  --  2.3  PHOS 2.4 2.1* 2.5   Liver Function Tests:  Recent Labs  07/05/14 1600 07/06/14 0400  ALBUMIN 2.3* 2.5*   No results for input(s): LIPASE, AMYLASE in the last 72 hours. CBC:  Recent Labs  07/05/14 0330 07/06/14 0400  WBC 6.1 6.0  HGB 11.1* 11.0*  HCT 34.0* 32.9*  MCV 84.6 85.7  PLT 57* 69*   Cardiac Enzymes: No results for input(s): CKTOTAL, CKMB, CKMBINDEX, TROPONINI in the last 72 hours. BNP: Invalid input(s): POCBNP D-Dimer: No results for input(s): DDIMER in the last 72 hours. Hemoglobin A1C: No results for input(s): HGBA1C in the last 72 hours. Fasting Lipid Panel: No results for input(s): CHOL, HDL, LDLCALC, TRIG, CHOLHDL, LDLDIRECT in the last 72 hours. Thyroid Function Tests: No results for input(s): TSH, T4TOTAL, T3FREE, THYROIDAB in the last 72 hours.  Invalid input(s): FREET3 Anemia Panel: No results for  input(s): VITAMINB12, FOLATE, FERRITIN, TIBC, IRON, RETICCTPCT in the last 72 hours. Coag Panel:   Lab Results  Component Value Date   INR 2.26* 07/01/2014   INR 2.47* 06/30/2014   INR 2.38* 06/29/2014    RADIOLOGY: Dg Chest 2 View  06/28/2014   CLINICAL DATA:  Syncope  EXAM: CHEST  2 VIEW  COMPARISON:  03/25/2010  FINDINGS: There are intact appearances of the transvenous leads. There is unchanged moderate cardiomegaly. There is mild interstitial thickening and central vascular congestion which is new from 2011. This likely represents a degree of congestive heart failure. No confluent alveolar opacities are evident. No effusions are evident.  IMPRESSION: Probable mild congestive heart failure.   Electronically Signed   By: Andreas Newport M.D.   On: 06/28/2014 22:01   Dg Abd 1 View  07/02/2014   CLINICAL DATA:  Abdominal distension.  EXAM: ABDOMEN - 1 VIEW  COMPARISON:  None.  FINDINGS: Moderate distention of the stomach with error. No bowel distention is seen to suggest obstruction. Soft tissues are poorly defined. No convincing renal or ureteral stones. There are vascular calcifications along the aorta and its branch vessels.  IMPRESSION: 1. No evidence of bowel obstruction. Moderate distention of the stomach, nonspecific.   Electronically  Signed   By: Lajean Manes M.D.   On: 07/02/2014 14:53   Ct Head Wo Contrast  06/28/2014   CLINICAL DATA:  Two episodes of syncope today  EXAM: CT HEAD WITHOUT CONTRAST  TECHNIQUE: Contiguous axial images were obtained from the base of the skull through the vertex without intravenous contrast.  COMPARISON:  None.  FINDINGS: The ventricles are normal in size and configuration. There is no intracranial mass, hemorrhage, extra-axial fluid collection, or midline shift. There is slight small vessel disease in the centra semiovale bilaterally. Elsewhere, gray-white compartments appear normal. No acute infarct apparent. The bony calvarium appears intact. The mastoid  air cells are clear. There is a small air-fluid level in the right maxillary antrum with a a retention cyst along the anterior inferior right maxillary antrum. There is rightward deviation of the nasal septum.  IMPRESSION: Mild periventricular small vessel disease. No intracranial mass, hemorrhage, or acute appearing infarct. Evidence of right maxillary sinus disease.   Electronically Signed   By: Lowella Grip III M.D.   On: 06/28/2014 21:30   Dg Chest Port 1 View  06/30/2014   CLINICAL DATA:  Left-sided hemodialysis catheter placement  EXAM: PORTABLE CHEST - 1 VIEW  COMPARISON:  06/29/2014  FINDINGS: New left IJ approach dual lumen dialysis catheter has been placed with tip over the mid SVC. No pneumothorax. Right IJ line has been removed. No significant change otherwise. Gaseous gastric distention is partly visualized.  IMPRESSION: Support apparatus as above.   Electronically Signed   By: Conchita Paris M.D.   On: 06/30/2014 14:25   Dg Chest Portable 1 View  06/29/2014   CLINICAL DATA:  Central line placement.  Subsequent evaluation.  EXAM: PORTABLE CHEST - 1 VIEW  COMPARISON:  Chest radiograph June 28, 2014  FINDINGS: The cardiac silhouette appears at least mild apparently enlarged coming with considerations low inspiratory examination. Cauda vascular markings without pleural effusion or focal consolidation. Calcified aortic knob. Strandy densities LEFT lung base.  Interval placement RIGHT internal jugular central venous catheter with distal tip projecting cavoatrial junction. No pneumothorax. Four lead LEFT cardiac defibrillator in situ. Soft tissue planes and included osseous structures are nonsuspicious. Moderate degenerative change of the thoracic spine.  IMPRESSION: RIGHT internal jugular central venous catheter distal tip projects cavoatrial junction. No pneumothorax.  Cardiomegaly.  LEFT lung base atelectasis/scar.   Electronically Signed   By: Elon Alas   On: 06/29/2014 02:05     ASSESSMENT: Clayton [name]:   1) NSTEMI: High risk for CAD with PAD and ESRD. Ischemic cardiomyopathy, low EF 20%. He is still hypotensive requiring pressors.  Not  a candidate for cardiac cath or advanced heart failure therapies due to multisystem disease. Persistent hypotension despite adequate treatment of infection indicates primary issue is low cardiac output. I doubt at this point that we will see much improvement. Volume status looks fairly good with CVP 11 mm Hg. On levophed. I see no role for systemic anticoagulation at this point.   2) ESRD  3) Palliative being consulted for goals of care. Not much to offer from a cardiac standpoint unless he improves significantly from his other medical issues. No cardiac sx at this time.    Tehran Rabenold [place], MD  07/06/2014  11:50 AM

## 2014-07-06 NOTE — Progress Notes (Addendum)
PULMONARY / CRITICAL CARE MEDICINE   Name: ABDIFATAH TARWATER MRN: [id number] DOB: [birth year]    ADMISSION DATE:  06/28/2014  REFERRING MD :  EDP   CHIEF COMPLAINT:  Hypotension   INITIAL PRESENTATION: 69 yo male with hx ESRD, CHF, DM, CAD, bilat BKA who receives all his care at [place].  He presented 2/14 after a fall at home with syncope.  He has had diarrhea x 2-3 weeks with negative GI w/u thus far at [facility] (was scheduled for upper/lower scope next week).  In ER was significantly hypotensive with SBP 60's, hypokalemic with K 2.4.  No sig fluid resuscitation given r/t ESRD and CHF.  PCCM called to admit.   STUDIES:  CT head 2/14>>> neg acute  2D echo 2/14>>> LVEF is severely depressed at approximately 20% with hypokinesis of the inferiore wall (base, mid), anterior wall (base, minimally mid)m basal lateral wall;, basal posterior wall; akinesis elsewhere.  SIGNIFICANT EVENTS: 2/14 >> Hypotension s/p 1L fluids - Neo &Dopa started 2/15 >> soft BP persist; On Neo;  2/16 >> Midodrine started by Renal; CRRT started; Neo switched to levo 2/17 >> C diff +; Vanc/Flagyl started 2/21- more alert  SUBJECTIVE: remains in shock   VITAL SIGNS: Temp:  [97.6 F (36.4 C)-98.7 F (37.1 C)] 98.1 F (36.7 C) (02/22 0738) Pulse Rate:  [29-107] 57 (02/22 0630) Resp:  [12-28] 15 (02/22 0700) BP: (65-124)/(31-99) 99/52 mmHg (02/22 0700) SpO2:  [20 %-100 %] 95 % (02/22 0645) Weight:  [78.2 kg (172 lb 6.4 oz)] 78.2 kg (172 lb 6.4 oz) (02/22 0430) HEMODYNAMICS:   VENTILATOR SETTINGS:   INTAKE / OUTPUT:  Intake/Output Summary (Last 24 hours) at 07/06/14 0824 Last data filed at 07/06/14 [id number]  Gross per 24 hour  Intake 809.86 ml  Output   1984 ml  Net -1174.14 ml    PHYSICAL EXAMINATION: General:  Chronically ill appearing male, NAD, more awake. Appropriate  Neuro:  More Awake, sp clear . Oriented X 2 HEENT:  Mm dry; no JVD  Cardiovascular:  s1s2 irreg; murmur present  Lungs: CTA, reduced bases   Abdomen:  Soft, +bs active, non tender  Musculoskeletal:  bilat BKA   LABS:  CBC  Recent Labs Lab 07/03/14 0500 07/05/14 0330 07/06/14 0400  WBC 3.9* 6.1 6.0  HGB 11.0* 11.1* 11.0*  HCT 33.9* 34.0* 32.9*  PLT 48* 57* 69*   Coag's  Recent Labs Lab 06/30/14 0540 07/01/14 0444  07/04/14 0500 07/05/14 0330 07/06/14 0400  APTT  --  52*  < > 61* 49* 44*  INR 2.47* 2.26*  --   --   --   --   < > = values in this interval not displayed. BMET  Recent Labs Lab 07/05/14 0330 07/05/14 1600 07/06/14 0400  NA 137 136 135  K 4.3 3.9 3.9  CL 101 103 101  CO2 28 26 25   BUN 9 10 12   CREATININE 2.25* 2.01* 2.09*  GLUCOSE 135* 154* 159*   Electrolytes  Recent Labs Lab 07/04/14 0500  07/05/14 0330 07/05/14 1600 07/06/14 0400  CALCIUM 7.5*  < > 7.9* 7.7* 7.6*  MG 2.2  --  2.4  --  2.3  PHOS 2.1*  < > 2.4 2.1* 2.5  < > = values in this interval not displayed. Sepsis Markers  Recent Labs Lab 06/29/14 1629 06/30/14 0830 07/01/14 0447  LATICACIDVEN 2.7* 3.9* 2.0   ABG  Recent Labs Lab 07/04/14 1536  PHART 7.376  PCO2ART 40.9  PO2ART 86.0  Liver Enzymes  Recent Labs Lab 06/30/14 0830  07/05/14 0330 07/05/14 1600 07/06/14 0400  AST 32  --   --   --   --   ALT 13  --   --   --   --   ALKPHOS 106  --   --   --   --   BILITOT 1.8*  --   --   --   --   ALBUMIN 2.5*  < > 2.5* 2.3* 2.5*  < > = values in this interval not displayed. Cardiac Enzymes  Recent Labs Lab 06/29/14 1100 06/30/14 0540 07/01/14 0444  TROPONINI 3.16* 4.13* 2.35*   Glucose  Recent Labs Lab 07/05/14 0048 07/05/14 0427 07/05/14 0659 07/05/14 1113 07/05/14 1512 07/05/14 2226  GLUCAP 117* 108* 119* 135* 127* 121*    Imaging No results found.  ASSESSMENT / PLAN:  PULMONARY Pulmonary HTN Central sleep apnea witnessed  P:   Supplemental O2 as needed  IS Will discuss with family consideration DNI  CARDIOVASCULAR CVL  2/14>>> CAD; HFrEF (20%)  DM Smoldering  septic shock from cdiff cardiomyopathy 20% HTN  Pacemaker/ AICD NSTEMI [facility]. Unstable for cath P:  KVO fluids  Midodrine started 2/16 Pressors: Levo SBP goal >90, still required CVP: re assess  As remains on levo at 5, and was again neg 1.1 liters Stress dose steroids remain until off pressors Heparin for crrt is off, will discuss with cards any role to add systemic heparin now Once off pressors, then BB if tolerated Avoid vaso with ischemia risk  RENAL ESRD - MWF HD - did get full HD Friday without difficulty  Hypokalemia  P:   Renal following; CRRT  Was neg 1.1 liters again  cvp needed  GASTROINTESTINAL Diarrhea - ongoing x 3weeks.  Denies abd pain, nausea. C diff + 2/17 P:   pepcid  Avoiding PPI Continued vanc enema Diet tolerated  HEMATOLOGIC Thrombocytopenia improving P:  F/u CBC Plat improving, may be able to start sub q heparin soon  INFECTIOUS PMC P:   BCx >> NGTD Vanc 2/15 >> 2/17 day 3/3 Zosyn 2/15 >>2/17  Day 3/3  Oral Vanc 2/17 >> Day 4/10-14 Metronidazole 2/17 >> 4/10-14 vanc enema 2/20>>> Resume oral vanc 2/21>>>  May consider dc enema in am , pending shock state  ENDOCRINE DM  Relative adrenal insuff P:   SSI Stress dose steroids remain  NEUROLOGIC Syncope - suspect r/t significant hypotension/ volume depletion- Resolved CT head negative Hypoactive delirium  P:   Supportive care Avoid benzo  FAMILY  - Updates: updated at bedside 2/15  - Inter-disciplinary family meet or Palliative Care meeting completed 2/18  Will have family meeting today 2/22  Ccm time required 30 min   Lavon Paganini. [name], MD, [place] Pgr: [facility] 07/06/2014 8:24 AM

## 2014-07-06 NOTE — Progress Notes (Signed)
  Assessment/Plan: 69 year old BM with multiple medical issues including ESRD MWF at [facility] presents with diarrhea/syncope 1 syncope/hypotension- cardiogenic v sepsis 2 ESRD: normally MWF via AVF - no regular HD since Friday 2/12- CRRT started 2/16 via vascath - STOP CRRT 3 C Diff  Subjective: Interval History: No major change. CVP now 11  Objective: Vital signs in last 24 hours: Temp:  [97.6 F (36.4 C)-98.7 F (37.1 C)] 98.1 F (36.7 C) (02/22 0738) Pulse Rate:  [29-107] 106 (02/22 0900) Resp:  [10-28] 19 (02/22 0900) BP: (65-124)/(31-99) 80/54 mmHg (02/22 0900) SpO2:  [20 %-100 %] 96 % (02/22 0900) Weight:  [78.2 kg (172 lb 6.4 oz)] 78.2 kg (172 lb 6.4 oz) (02/22 0430) Weight change: -1.1 kg (-2 lb 6.8 oz)  Intake/Output from previous day: 02/21 0701 - 02/22 0700 In: 814.6 [P.O.:60; I.V.:404.6; IV Piggyback:350] Out: 1984  Intake/Output this shift: Total I/O In: 162 [P.O.:40; I.V.:22; IV Piggyback:100] Out: 168 [Other:168]  General appearance: confused GI: soft, non-tender; bowel sounds normal; no masses,  no organomegaly Extremities: bilat BKAs Skin: Skin color, texture, turgor normal. No rashes or lesions or diminished skin turgor  Lab Results:  Recent Labs  07/05/14 0330 07/06/14 0400  WBC 6.1 6.0  HGB 11.1* 11.0*  HCT 34.0* 32.9*  PLT 57* 69*   BMET:  Recent Labs  07/05/14 1600 07/06/14 0400  NA 136 135  K 3.9 3.9  CL 103 101  CO2 26 25  GLUCOSE 154* 159*  BUN 10 12  CREATININE 2.01* 2.09*  CALCIUM 7.7* 7.6*   No results for input(s): PTH in the last 72 hours. Iron Studies: No results for input(s): IRON, TIBC, TRANSFERRIN, FERRITIN in the last 72 hours. Studies/Results: No results found.  Scheduled: . antiseptic oral rinse  7 mL Mouth Rinse BID  . famotidine (PEPCID) IV  20 mg Intravenous Q24H  . feeding supplement (NEPRO CARB STEADY)  237 mL Oral TID BM  . hydrocortisone sodium succinate  50 mg Intravenous Q6H  . insulin aspart  0-9  Units Subcutaneous TID WC  . metronidazole  500 mg Intravenous Q8H  . midodrine  10 mg Oral BID WC  . vancomycin  500 mg Oral 4 times per day  . vancomycin (VANCOCIN) rectal ENEMA  500 mg Rectal 4 times per day     LOS: 8 days   Lurleen Soltero C 07/06/2014,9:44 AM

## 2014-07-06 NOTE — Progress Notes (Signed)
Speech Language Pathology Treatment: Dysphagia  Patient Details Name: Clayton [name] MRN: [id number] DOB: 11-12-1945 Today's Date: 07/06/2014 Time: [id number] SLP Time Calculation (min) (ACUTE ONLY): 8 min  Assessment / Plan / Recommendation Clinical Impression  Skilled observation complete with pos. Patient continues to present with signs of a mild dysphagia characterized by anterior labial spillage and multiple swallows, both improving with max clinician cueing for self feeding to improve awareness of bolus. Baseline cough noted with subsided with po trials accept for one instance of coughing post swallow appearing related to decreased airway protection. Patient very lethargic this session, requiring max cues to maintain adequate level of alertness for po intake. SLP suspects that fluctuations in swallowing ability related to fluctuating mentation. Discussed with RN. Recommend continuation of current diet, allowing to eat only when fully alert to maximize safety. SLP will continue to f/u.    HPI HPI: 69 yo male with multiple medical problems who presented with several week history of diarrhea, syncopal episode. Found to have NSTEMI, persistent hypotension, c-diff. RN reports difficulty swallowing.    Pertinent Vitals Pain Assessment: No/denies pain  SLP Plan  Continue with current plan of care    Recommendations Diet recommendations: Thin liquid (clear liquids) Liquids provided via: Cup Medication Administration: Crushed with puree Supervision: Staff to assist with self feeding;Full supervision/cueing for compensatory strategies Compensations: Slow rate;Small sips/bites Postural Changes and/or Swallow Maneuvers: Seated upright 90 degrees              Oral Care Recommendations: Oral care BID Follow up Recommendations: Skilled Nursing facility Plan: Continue with current plan of care    Alburnett Kimberly, [place] [phone]    Destany Severns Meryl 07/06/2014, 10:09 AM

## 2014-07-07 ENCOUNTER — Inpatient Hospital Stay (HOSPITAL_COMMUNITY): Payer: Medicare Other

## 2014-07-07 LAB — GLUCOSE, CAPILLARY
GLUCOSE-CAPILLARY: 117 mg/dL — AB (ref 70–99)
GLUCOSE-CAPILLARY: 143 mg/dL — AB (ref 70–99)
GLUCOSE-CAPILLARY: 173 mg/dL — AB (ref 70–99)
Glucose-Capillary: 138 mg/dL — ABNORMAL HIGH (ref 70–99)

## 2014-07-07 LAB — COMPREHENSIVE METABOLIC PANEL
ALK PHOS: 95 U/L (ref 39–117)
ALT: 33 U/L (ref 0–53)
AST: 27 U/L (ref 0–37)
Albumin: 2.3 g/dL — ABNORMAL LOW (ref 3.5–5.2)
Anion gap: 5 (ref 5–15)
BUN: 23 mg/dL (ref 6–23)
CALCIUM: 7.7 mg/dL — AB (ref 8.4–10.5)
CO2: 26 mmol/L (ref 19–32)
Chloride: 106 mmol/L (ref 96–112)
Creatinine, Ser: 3.03 mg/dL — ABNORMAL HIGH (ref 0.50–1.35)
GFR calc Af Amer: 23 mL/min — ABNORMAL LOW (ref >90)
GFR calc non Af Amer: 20 mL/min — ABNORMAL LOW (ref >90)
Glucose, Bld: 130 mg/dL — ABNORMAL HIGH (ref 70–99)
POTASSIUM: 3.7 mmol/L (ref 3.5–5.1)
SODIUM: 137 mmol/L (ref 135–145)
TOTAL PROTEIN: 6.1 g/dL (ref 6.0–8.3)
Total Bilirubin: 1.5 mg/dL — ABNORMAL HIGH (ref 0.3–1.2)

## 2014-07-07 LAB — CBC WITH DIFFERENTIAL/PLATELET
BASOS ABS: 0 10*3/uL (ref 0.0–0.1)
Basophils Relative: 0 % (ref 0–1)
EOS ABS: 0 10*3/uL (ref 0.0–0.7)
EOS PCT: 0 % (ref 0–5)
HCT: 28.6 % — ABNORMAL LOW (ref 39.0–52.0)
Hemoglobin: 9.5 g/dL — ABNORMAL LOW (ref 13.0–17.0)
Lymphocytes Relative: 7 % — ABNORMAL LOW (ref 12–46)
Lymphs Abs: 0.3 10*3/uL — ABNORMAL LOW (ref 0.7–4.0)
MCH: 28.6 pg (ref 26.0–34.0)
MCHC: 33.2 g/dL (ref 30.0–36.0)
MCV: 86.1 fL (ref 78.0–100.0)
MONO ABS: 0.3 10*3/uL (ref 0.1–1.0)
Monocytes Relative: 8 % (ref 3–12)
NEUTROS PCT: 85 % — AB (ref 43–77)
Neutro Abs: 3.1 10*3/uL (ref 1.7–7.7)
Platelets: 50 10*3/uL — ABNORMAL LOW (ref 150–400)
RBC: 3.32 MIL/uL — ABNORMAL LOW (ref 4.22–5.81)
RDW: 22.3 % — ABNORMAL HIGH (ref 11.5–15.5)
WBC: 3.7 10*3/uL — ABNORMAL LOW (ref 4.0–10.5)

## 2014-07-07 LAB — MAGNESIUM: MAGNESIUM: 2.4 mg/dL (ref 1.5–2.5)

## 2014-07-07 MED ORDER — BOOST / RESOURCE BREEZE PO LIQD
1.0000 | Freq: Three times a day (TID) | ORAL | Status: DC
  Administered 2014-07-07 – 2014-07-09 (×3): 1 via ORAL
  Administered 2014-07-09: 1 mL via ORAL
  Administered 2014-07-09 – 2014-07-10 (×4): 1 via ORAL

## 2014-07-07 MED ORDER — CETYLPYRIDINIUM CHLORIDE 0.05 % MT LIQD
7.0000 mL | Freq: Two times a day (BID) | OROMUCOSAL | Status: DC
  Administered 2014-07-09 – 2014-07-10 (×2): 7 mL via OROMUCOSAL

## 2014-07-07 MED ORDER — INSULIN ASPART 100 UNIT/ML ~~LOC~~ SOLN
0.0000 [IU] | SUBCUTANEOUS | Status: DC
  Administered 2014-07-07: 1 [IU] via SUBCUTANEOUS
  Administered 2014-07-08: 2 [IU] via SUBCUTANEOUS
  Administered 2014-07-08 (×2): 1 [IU] via SUBCUTANEOUS
  Administered 2014-07-09 (×2): 2 [IU] via SUBCUTANEOUS
  Administered 2014-07-09: 1 [IU] via SUBCUTANEOUS
  Administered 2014-07-09: 2 [IU] via SUBCUTANEOUS
  Administered 2014-07-10 (×4): 1 [IU] via SUBCUTANEOUS
  Administered 2014-07-10: 2 [IU] via SUBCUTANEOUS

## 2014-07-07 NOTE — Progress Notes (Signed)
Patient [id number] BARNABY FICK      DOB: 1946-02-08      [id number]   Palliative Medicine Team at [facility] Progress Note    Subjective: No purposeful interaction with me. Lethargic.      Filed Vitals:   07/07/14 0900  BP: 101/54  Pulse: 82  Temp:   Resp: 19   Physical exam: GEN: alert, NAD HEENT:  CV: RRR LUNGS: CTAB EXT: bilat lower ext amputations  CBC    Component Value Date/Time   WBC 3.7* 07/07/2014 0455   RBC 3.32* 07/07/2014 0455   HGB 9.5* 07/07/2014 0455   HCT 28.6* 07/07/2014 0455   PLT 50* 07/07/2014 0455   MCV 86.1 07/07/2014 0455   MCH 28.6 07/07/2014 0455   MCHC 33.2 07/07/2014 0455   RDW 22.3* 07/07/2014 0455   LYMPHSABS 0.3* 07/07/2014 0455   MONOABS 0.3 07/07/2014 0455   EOSABS 0.0 07/07/2014 0455   BASOSABS 0.0 07/07/2014 0455    CMP     Component Value Date/Time   NA 137 07/07/2014 0455   K 3.7 07/07/2014 0455   CL 106 07/07/2014 0455   CO2 26 07/07/2014 0455   GLUCOSE 130* 07/07/2014 0455   BUN 23 07/07/2014 0455   CREATININE 3.03* 07/07/2014 0455   CALCIUM 7.7* 07/07/2014 0455   PROT 6.1 07/07/2014 0455   ALBUMIN 2.3* 07/07/2014 0455   AST 27 07/07/2014 0455   ALT 33 07/07/2014 0455   ALKPHOS 95 07/07/2014 0455   BILITOT 1.5* 07/07/2014 0455   GFRNONAA 20* 07/07/2014 0455   GFRAA 23* 07/07/2014 0455      Assessment and plan: 69 yo male with multiple medical problems who presented with several week history of diarrhea, syncopal episode. Found to have NSTEMI, persistent hypotension, c-diff  1. Code Status: Full  2. GOC: Spoke with Keely this morning and reveiwed care, events in hospital.  BP doing better and now off CRRT.  Clinical status remains tenuous. Reviewed Advance Directive/Living will placed in chart from [place].  This was filled out in 2014 reportedly by SW at [place].  In this living will, [employer] marked boxes to do all life prolonging measures possible, even in situations of PVS, coma, severe brain damage, condition  that will result in death very soon, and even pain or severe symptoms that cause suffering. To say the least, this is extremely unusual, and hard to find reasoning why anyone with understanding of these medical issues would want these things in such scenarios.  In talking with his wife, he filled this out when his QOL was poor, and the reason he did this was because he was fearful of not being alive for his daughter whom is now 76 years old.  It's a bit disheartening that these options are available on an advanced directives, and that more discussion by medically trained providers are not required to discuss such extreme views.  In speaking with wife, I think she agrees that any situation in which we as physicians feel that potentially inappropriate or futile treatments are being reached, she would likely not contest physicians with-holding treatment we view as futile.  The living will Jaysun filled out also states that his [facility]) should not be able to override his checked preferences.  I think when we get to point of potentially inappropriate or futile treatments, we need to explore our futility policy in order to ensure we do not provide unnecessary harm to this man.    I am off service tomorrow, but please  feel free to direct questions to PMT phone.   3. Symptom Management:  1. Encephalopathy-agree with PRN haldol, avoiding benzos, ongoing treatment of medical issues.   4. Psychosocial/Spiritual: Frances Maywood almost all his care at [facility]. Wife prefers transfer there where physicians know him but understands he is too sick to safely do so. Lives in [place]. He has 2 children with Keely and 2 other adult children from previous relationship.    Total Time: 40 minutes  Doran Clay D.O. Palliative Medicine Team at [facility]  Pager: [phone] Team Phone: [phone]

## 2014-07-07 NOTE — Progress Notes (Signed)
PULMONARY / CRITICAL CARE MEDICINE   Name: Clayton [name] MRN: [id number] DOB: Nov 02, 1945    ADMISSION DATE:  06/28/2014  REFERRING MD :  EDP   CHIEF COMPLAINT:  Hypotension   INITIAL PRESENTATION: 69 yo male with hx ESRD, CHF, DM, CAD, bilat BKA who receives all his care at [place].  He presented 2/14 after a fall at home with syncope.  He has had diarrhea x 2-3 weeks with negative GI w/u thus far at [facility] (was scheduled for upper/lower scope next week).  In ER was significantly hypotensive with SBP 60's, hypokalemic with K 2.4.  No sig fluid resuscitation given r/t ESRD and CHF.  PCCM called to admit.   STUDIES:  CT head 2/14>>> neg acute  2D echo 2/14>>> LVEF is severely depressed at approximately 20% with hypokinesis of the inferiore wall (base, mid), anterior wall (base, minimally mid)m basal lateral wall;, basal posterior wall; akinesis elsewhere.  SIGNIFICANT EVENTS: 2/14 >> Hypotension s/p 1L fluids - Neo &Dopa started 2/15 >> soft BP persist; On Neo;  2/16 >> Midodrine started by Renal; CRRT started; Neo switched to levo 2/17 >> C diff +; Vanc/Flagyl started 2/21- more alert 2/22 >> CRRT stopped 10am; Pressors off 3pm as BP allowed  SUBJECTIVE: Drowsy but awake to answer question appropriately. Denies any current pain.   VITAL SIGNS: Temp:  [98 F (36.7 C)-98.5 F (36.9 C)] 98.1 F (36.7 C) (02/23 0433) Pulse Rate:  [34-190] 76 (02/23 0615) Resp:  [9-24] 19 (02/23 0615) BP: (73-122)/(38-101) 110/55 mmHg (02/23 0615) SpO2:  [92 %-100 %] 97 % (02/23 0615) Weight:  [170 lb 10.2 oz (77.4 kg)] 170 lb 10.2 oz (77.4 kg) (02/23 0500) HEMODYNAMICS: CVP:  [11 mmHg] 11 mmHg VENTILATOR SETTINGS:   INTAKE / OUTPUT:  Intake/Output Summary (Last 24 hours) at 07/07/14 0741 Last data filed at 07/07/14 0600  Gross per 24 hour  Intake 936.93 ml  Output    671 ml  Net 265.93 ml    PHYSICAL EXAMINATION: General:  Chronically ill appearing male, NAD. Drowsy. Appropriate  Neuro:   Alert; Oriented X 2 HEENT:  Mm dry; no JVD  Cardiovascular:  s1s2 irreg; murmur present  Lungs: CTA, reduced bases  Abdomen:  Soft, +bs active, non tender  Musculoskeletal:  bilat BKA   LABS:  CBC  Recent Labs Lab 07/05/14 0330 07/06/14 0400 07/07/14 0455  WBC 6.1 6.0 3.7*  HGB 11.1* 11.0* 9.5*  HCT 34.0* 32.9* 28.6*  PLT 57* 69* 50*   Coag's  Recent Labs Lab 07/01/14 0444  07/04/14 0500 07/05/14 0330 07/06/14 0400  APTT 52*  < > 61* 49* 44*  INR 2.26*  --   --   --   --   < > = values in this interval not displayed. BMET  Recent Labs Lab 07/05/14 1600 07/06/14 0400 07/07/14 0455  NA 136 135 137  K 3.9 3.9 3.7  CL 103 101 106  CO2 26 25 26   BUN 10 12 23   CREATININE 2.01* 2.09* 3.03*  GLUCOSE 154* 159* 130*   Electrolytes  Recent Labs Lab 07/05/14 0330 07/05/14 1600 07/06/14 0400 07/07/14 0455  CALCIUM 7.9* 7.7* 7.6* 7.7*  MG 2.4  --  2.3 2.4  PHOS 2.4 2.1* 2.5  --    Sepsis Markers  Recent Labs Lab 06/30/14 0830 07/01/14 0447  LATICACIDVEN 3.9* 2.0   ABG  Recent Labs Lab 07/04/14 1536  PHART 7.376  PCO2ART 40.9  PO2ART 86.0   Liver Enzymes  Recent Labs  Lab 06/30/14 0830  07/05/14 1600 07/06/14 0400 07/07/14 0455  AST 32  --   --   --  27  ALT 13  --   --   --  33  ALKPHOS 106  --   --   --  95  BILITOT 1.8*  --   --   --  1.5*  ALBUMIN 2.5*  < > 2.3* 2.5* 2.3*  < > = values in this interval not displayed. Cardiac Enzymes  Recent Labs Lab 07/01/14 0444  TROPONINI 2.35*   Glucose  Recent Labs Lab 07/05/14 1512 07/05/14 2226 07/06/14 0737 07/06/14 1201 07/06/14 1526 07/06/14 1946  GLUCAP 127* 121* 148* 140* 138* 102*    Imaging No results found.  ASSESSMENT / PLAN:  PULMONARY Pulmonary HTN Central sleep apnea witnessed  P:   Supplemental O2 as needed  IS  CARDIOVASCULAR CVL  2/14>>> CAD; HFrEF (20%)  DM Smoldering septic shock from cdiff cardiomyopathy 20% HTN  Pacemaker/ AICD NSTEMI  [facility]. Unstable for cath P:  KVO fluids  Midodrine started 2/16 Pressors: Off 2/22 @ 3pm Stress dose steroids remain until off pressors - at 24 hr off pressors, reduce taper steroids  Will discuss with cards any role to add systemic heparin now Avoid vaso with ischemia risk Baseline LOW BP, sys 85-90 goal   RENAL ESRD - MWF HD - did get full HD Friday without difficulty  Hypokalemia  P:   Renal following; CRRT stopped 2/22 @ 10am Was neg 150cc in last 24hrs   GASTROINTESTINAL Diarrhea - ongoing x 3weeks.  Denies abd pain, nausea. C diff + 2/17 P:   pepcid  Avoiding PPI Diet tolerated Consider dc vanc enema  HEMATOLOGIC Thrombocytopenia improving leukopenia P:  F/u CBC Plts trend noted, no sub q heparin   INFECTIOUS PMC P:   BCx >> NGTD Vanc 2/15 >> 2/17 day 3/3 Zosyn 2/15 >>2/17  Day 3/3  Oral Vanc 2/17 >> Day 7/10-14 Metronidazole 2/17 >> 7/10-14 vanc enema 2/20>>> Resume oral vanc 2/21>>>  May consider dc enema  ENDOCRINE DM  Relative adrenal insuff P:   SSI Stress dose steroids remain reduce in am if BP still ok   NEUROLOGIC Syncope - suspect r/t significant hypotension/ volume depletion- Resolved CT head negative Hypoactive delirium  P:   Supportive care Avoid benzo  FAMILY  - Updates: updated at bedside 2/15  - Inter-disciplinary family meet or Palliative Care meeting completed 2/18- [name], revisit Friday goals  Advanced directive revealed he wanted to have pain and suffer with NO benefit. This directive is bothersome. The fact that an attorney would allow a patient to sign this document is despicable. May need to clarify how this document was produced and push forward.   Olam Idler, MD 07/07/2014, 8:03 AM PGY-2, [facility]  STAFF NOTE: I, Merrie Roof, MD FACP have personally reviewed patient's available data, including medical history, events  of note, physical examination and test results as part of my evaluation. I have discussed with resident/NP and other care providers such as pharmacist, RN and RRT. In addition, I personally evaluated patient and elicited key findings of: IMproved BP to baseline (Low), sys 85-90 is goa, then will attempt HD, if fails may need comfort car approach, chem in am , dc vanc enema   Lavon Paganini. [name], MD, [place] Pgr: [facility] 07/07/2014 11:27 AM

## 2014-07-07 NOTE — Progress Notes (Signed)
NUTRITION FOLLOW UP  DOCUMENTATION CODES Per approved criteria  Moderate malnutrition in the context of chronic illness   Pt meets criteria for moderate MALNUTRITION in the context of chronic illness as evidenced by mild-moderate depletion of muscle mass and intake </= 75% of estimated energy requirement for >/= 1 month.     Intervention:    D/C Nepro Shakes (not clear liquid)  Add Resource Breeze PO TID, each supplement provides 250 kcal and 9 grams of protein  Nutrition Dx:   Inadequate oral intake related to poor appetite as evidenced by poor intake of meals. Ongoing.  Goal:   Intake to meet >90% of estimated nutrition needs. Unmet.  Monitor:   PO intake, labs, weight trend.  Assessment:   Patient admitted on 2/14 after a fall at home with syncope. Hx of ESRD on HD, bilateral BKA.  CRRT stopped on 2/22, plans for IHD 2/24. Diet changed to clear liquids on 2/21. SLP following for dysphagia. Patient with lethargy and fluctuating mentation. PO intake is minimal.  Height: Ht Readings from Last 1 Encounters:  06/28/14 6\' 5"  (1.956 m)    Weight Status:   Wt Readings from Last 1 Encounters:  07/07/14 170 lb 10.2 oz (77.4 kg)   07/01/14 187 lb 6.3 oz (85 kg)        Re-estimated needs:  Kcal: 2500-2700 Protein: 110-125 gm Fluid: 1.2 L  Skin: stage 2 pressure ulcers to sacrum and buttocks  Diet Order: Diet clear liquid   Intake/Output Summary (Last 24 hours) at 07/07/14 1235 Last data filed at 07/07/14 0930  Gross per 24 hour  Intake 1210.84 ml  Output    550 ml  Net 660.84 ml    Last BM: 2/23 (diarrhea, c diff +)   Labs:   Recent Labs Lab 07/05/14 0330 07/05/14 1600 07/06/14 0400 07/07/14 0455  NA 137 136 135 137  K 4.3 3.9 3.9 3.7  CL 101 103 101 106  CO2 28 26 25 26   BUN 9 10 12 23   CREATININE 2.25* 2.01* 2.09* 3.03*  CALCIUM 7.9* 7.7* 7.6* 7.7*  MG 2.4  --  2.3 2.4  PHOS 2.4 2.1* 2.5  --   GLUCOSE 135* 154* 159* 130*    CBG (last  3)   Recent Labs  07/06/14 1946 07/07/14 0801 07/07/14 1201  GLUCAP 102* 117* 143*    Scheduled Meds: . antiseptic oral rinse  7 mL Mouth Rinse BID  . famotidine (PEPCID) IV  20 mg Intravenous Q24H  . feeding supplement (NEPRO CARB STEADY)  237 mL Oral TID BM  . hydrocortisone sodium succinate  50 mg Intravenous Q6H  . insulin aspart  0-9 Units Subcutaneous TID WC  . metronidazole  500 mg Intravenous Q8H  . midodrine  10 mg Oral BID WC  . vancomycin  500 mg Oral 4 times per day    Continuous Infusions: . dextrose 5 % and 0.9% NaCl 10 mL/hr at 07/06/14 0103  . norepinephrine (LEVOPHED) Adult infusion Stopped (07/06/14 1515)    Molli Barrows, RD, LDN, [place] Pager [phone] After Hours Pager [phone]

## 2014-07-07 NOTE — Progress Notes (Signed)
SUBJECTIVE:  Lethargic but responsive.  OBJECTIVE:   Vitals:   Filed Vitals:   07/07/14 0530 07/07/14 0545 07/07/14 0600 07/07/14 0615  BP: 73/38 101/57 81/59 110/55  Pulse: 93 79 87 76  Temp:      TempSrc:      Resp: 16 19 19 19   Height:      Weight:      SpO2: 98% 98% 99% 97%   I&O's:    Intake/Output Summary (Last 24 hours) at 07/07/14 0736 Last data filed at 07/07/14 0600  Gross per 24 hour  Intake 936.93 ml  Output    671 ml  Net 265.93 ml   TELEMETRY: Reviewed telemetry- paced rhythm. Occ. PVCs with rare triplet   PHYSICAL EXAM General: thin frail, in no acute distress  Lungs:   Clear bilaterally to auscultation and percussion. Heart:   HRRR S1 S2 Pulses are 2+ & equal. Abdomen: Bowel sounds are positive, abdomen soft and non-tender without masses  Extremities:   Bilateral BKA  LABS: Basic Metabolic Panel:  Recent Labs  07/05/14 1600 07/06/14 0400 07/07/14 0455  NA 136 135 137  K 3.9 3.9 3.7  CL 103 101 106  CO2 26 25 26   GLUCOSE 154* 159* 130*  BUN 10 12 23   CREATININE 2.01* 2.09* 3.03*  CALCIUM 7.7* 7.6* 7.7*  MG  --  2.3 2.4  PHOS 2.1* 2.5  --    Liver Function Tests:  Recent Labs  07/06/14 0400 07/07/14 0455  AST  --  27  ALT  --  33  ALKPHOS  --  95  BILITOT  --  1.5*  PROT  --  6.1  ALBUMIN 2.5* 2.3*   No results for input(s): LIPASE, AMYLASE in the last 72 hours. CBC:  Recent Labs  07/06/14 0400 07/07/14 0455  WBC 6.0 3.7*  NEUTROABS  --  3.1  HGB 11.0* 9.5*  HCT 32.9* 28.6*  MCV 85.7 86.1  PLT 69* 50*   Coag Panel:   Lab Results  Component Value Date   INR 2.26* 07/01/2014   INR 2.47* 06/30/2014   INR 2.38* 06/29/2014    RADIOLOGY: Dg Chest 2 View  06/28/2014   CLINICAL DATA:  Syncope  EXAM: CHEST  2 VIEW  COMPARISON:  03/25/2010  FINDINGS: There are intact appearances of the transvenous leads. There is unchanged moderate cardiomegaly. There is mild interstitial thickening and central vascular congestion which  is new from 2011. This likely represents a degree of congestive heart failure. No confluent alveolar opacities are evident. No effusions are evident.  IMPRESSION: Probable mild congestive heart failure.   Electronically Signed   By: Andreas Newport M.D.   On: 06/28/2014 22:01   Dg Abd 1 View  07/02/2014   CLINICAL DATA:  Abdominal distension.  EXAM: ABDOMEN - 1 VIEW  COMPARISON:  None.  FINDINGS: Moderate distention of the stomach with error. No bowel distention is seen to suggest obstruction. Soft tissues are poorly defined. No convincing renal or ureteral stones. There are vascular calcifications along the aorta and its branch vessels.  IMPRESSION: 1. No evidence of bowel obstruction. Moderate distention of the stomach, nonspecific.   Electronically Signed   By: Lajean Manes M.D.   On: 07/02/2014 14:53   Ct Head Wo Contrast  06/28/2014   CLINICAL DATA:  Two episodes of syncope today  EXAM: CT HEAD WITHOUT CONTRAST  TECHNIQUE: Contiguous axial images were obtained from the base of the skull through the vertex without intravenous contrast.  COMPARISON:  None.  FINDINGS: The ventricles are normal in size and configuration. There is no intracranial mass, hemorrhage, extra-axial fluid collection, or midline shift. There is slight small vessel disease in the centra semiovale bilaterally. Elsewhere, gray-white compartments appear normal. No acute infarct apparent. The bony calvarium appears intact. The mastoid air cells are clear. There is a small air-fluid level in the right maxillary antrum with a a retention cyst along the anterior inferior right maxillary antrum. There is rightward deviation of the nasal septum.  IMPRESSION: Mild periventricular small vessel disease. No intracranial mass, hemorrhage, or acute appearing infarct. Evidence of right maxillary sinus disease.   Electronically Signed   By: Lowella Grip III M.D.   On: 06/28/2014 21:30   Dg Chest Port 1 View  06/30/2014   CLINICAL DATA:   Left-sided hemodialysis catheter placement  EXAM: PORTABLE CHEST - 1 VIEW  COMPARISON:  06/29/2014  FINDINGS: New left IJ approach dual lumen dialysis catheter has been placed with tip over the mid SVC. No pneumothorax. Right IJ line has been removed. No significant change otherwise. Gaseous gastric distention is partly visualized.  IMPRESSION: Support apparatus as above.   Electronically Signed   By: Conchita Paris M.D.   On: 06/30/2014 14:25   Dg Chest Portable 1 View  06/29/2014   CLINICAL DATA:  Central line placement.  Subsequent evaluation.  EXAM: PORTABLE CHEST - 1 VIEW  COMPARISON:  Chest radiograph June 28, 2014  FINDINGS: The cardiac silhouette appears at least mild apparently enlarged coming with considerations low inspiratory examination. Cauda vascular markings without pleural effusion or focal consolidation. Calcified aortic knob. Strandy densities LEFT lung base.  Interval placement RIGHT internal jugular central venous catheter with distal tip projecting cavoatrial junction. No pneumothorax. Four lead LEFT cardiac defibrillator in situ. Soft tissue planes and included osseous structures are nonsuspicious. Moderate degenerative change of the thoracic spine.  IMPRESSION: RIGHT internal jugular central venous catheter distal tip projects cavoatrial junction. No pneumothorax.  Cardiomegaly.  LEFT lung base atelectasis/scar.   Electronically Signed   By: Elon Alas   On: 06/29/2014 02:05    ASSESSMENT: Kathyrn Lass:   1) NSTEMI: High risk for CAD with PAD and ESRD. Ischemic cardiomyopathy, low EF 20%. He is still hypotensive at times but now off pressors.  Not  a candidate for cardiac cath or advanced heart failure therapies due to multisystem disease. Persistent hypotension despite adequate treatment of infection indicates primary issue is low cardiac output. I doubt at this point that we will see much improvement. Volume status looks fairly good with I/O positive 167 cc and weight down 2  lbs.   2) ESRD  3) Palliative being consulted for goals of care. Not much to offer from a cardiac standpoint unless he improves significantly from his other medical issues. No cardiac sx at this time.  4. C. Difficile colitis with sepsis    Peter Martinique, MD  07/07/2014  7:36 AM

## 2014-07-07 NOTE — Progress Notes (Signed)
Assessment/Plan: 69 year old BM with multiple medical issues including ESRD MWF at [facility] presents with diarrhea/syncope 1 syncope/hypotension- cardiogenic v sepsis 2 ESRD: normally MWF via AVF - no regular HD since Friday 2/12- CRRT started 2/16 via vascath - off CRRT, will do IHD in AM Wed 3 C Diff 4 Hgb variation  Subjective: Interval History: low BP  Objective: Vital signs in last 24 hours: Temp:  [98 F (36.7 C)-98.7 F (37.1 C)] 98.7 F (37.1 C) (02/23 0803) Pulse Rate:  [34-190] 76 (02/23 0615) Resp:  [9-24] 19 (02/23 0615) BP: (73-122)/(38-101) 110/55 mmHg (02/23 0615) SpO2:  [92 %-100 %] 97 % (02/23 0615) Weight:  [77.4 kg (170 lb 10.2 oz)] 77.4 kg (170 lb 10.2 oz) (02/23 0500) Weight change: -0.8 kg (-1 lb 12.2 oz)  Intake/Output from previous day: 02/22 0701 - 02/23 0700 In: 936.9 [P.O.:40; I.V.:246.9; IV Piggyback:350] Out: 671 [Stool:400] Intake/Output this shift:    General appearance: awake, ok, confused Chest wall: no tenderness GI: soft, non-tender; bowel sounds normal; no masses,  no organomegaly Extremities: bilat BKAs  Lab Results:  Recent Labs  07/06/14 0400 07/07/14 0455  WBC 6.0 3.7*  HGB 11.0* 9.5*  HCT 32.9* 28.6*  PLT 69* 50*   BMET:  Recent Labs  07/06/14 0400 07/07/14 0455  NA 135 137  K 3.9 3.7  CL 101 106  CO2 25 26  GLUCOSE 159* 130*  BUN 12 23  CREATININE 2.09* 3.03*  CALCIUM 7.6* 7.7*   No results for input(s): PTH in the last 72 hours. Iron Studies: No results for input(s): IRON, TIBC, TRANSFERRIN, FERRITIN in the last 72 hours. Studies/Results: Dg Chest Port 1 View  07/07/2014   CLINICAL DATA:  Shortness of breath, history of CHF and end-stage renal disease, C difficile colitis.  EXAM: PORTABLE CHEST - 1 VIEW  COMPARISON:  Portable chest x-ray of June 30, 2014  FINDINGS: The lungs remain hypoinflated. The interstitial markings have improved. There is alveolar density just above the right hemidiaphragm. The left  hemidiaphragm is largely obscured. The cardiac silhouette remains enlarged. The permanent pacemaker defibrillator is in appropriate position radiographically. A left internal jugular venous catheter tip projects in the region of the junction of the right and left brachiocephalic veins.  IMPRESSION: Bilateral hypoinflation accentuates the lung markings. However, there are confluent densities at both lung bases consistent with atelectasis or pneumonia. Pulmonary interstitial edema has improved somewhat. The cardiac silhouette remains enlarged.   Electronically Signed   By: David  Martinique   On: 07/07/2014 07:46   Scheduled: . antiseptic oral rinse  7 mL Mouth Rinse BID  . famotidine (PEPCID) IV  20 mg Intravenous Q24H  . feeding supplement (NEPRO CARB STEADY)  237 mL Oral TID BM  . hydrocortisone sodium succinate  50 mg Intravenous Q6H  . insulin aspart  0-9 Units Subcutaneous TID WC  . metronidazole  500 mg Intravenous Q8H  . midodrine  10 mg Oral BID WC  . vancomycin  500 mg Oral 4 times per day  . vancomycin (VANCOCIN) rectal ENEMA  500 mg Rectal 4 times per day     LOS: 9 days   Shamaya Kauer C 07/07/2014,8:46 AM

## 2014-07-08 DIAGNOSIS — A419 Sepsis, unspecified organism: Secondary | ICD-10-CM | POA: Diagnosis not present

## 2014-07-08 DIAGNOSIS — I472 Ventricular tachycardia: Secondary | ICD-10-CM

## 2014-07-08 DIAGNOSIS — I959 Hypotension, unspecified: Secondary | ICD-10-CM | POA: Diagnosis not present

## 2014-07-08 LAB — CBC
HCT: 29.8 % — ABNORMAL LOW (ref 39.0–52.0)
Hemoglobin: 10 g/dL — ABNORMAL LOW (ref 13.0–17.0)
MCH: 29.1 pg (ref 26.0–34.0)
MCHC: 33.6 g/dL (ref 30.0–36.0)
MCV: 86.6 fL (ref 78.0–100.0)
PLATELETS: 46 10*3/uL — AB (ref 150–400)
RBC: 3.44 MIL/uL — ABNORMAL LOW (ref 4.22–5.81)
RDW: 23.1 % — AB (ref 11.5–15.5)
WBC: 3.9 10*3/uL — ABNORMAL LOW (ref 4.0–10.5)

## 2014-07-08 LAB — RENAL FUNCTION PANEL
Albumin: 2.4 g/dL — ABNORMAL LOW (ref 3.5–5.2)
Anion gap: 6 (ref 5–15)
BUN: 35 mg/dL — AB (ref 6–23)
CALCIUM: 7.8 mg/dL — AB (ref 8.4–10.5)
CO2: 25 mmol/L (ref 19–32)
CREATININE: 4.03 mg/dL — AB (ref 0.50–1.35)
Chloride: 105 mmol/L (ref 96–112)
GFR calc non Af Amer: 14 mL/min — ABNORMAL LOW (ref >90)
GFR, EST AFRICAN AMERICAN: 16 mL/min — AB (ref >90)
GLUCOSE: 121 mg/dL — AB (ref 70–99)
PHOSPHORUS: 3.8 mg/dL (ref 2.3–4.6)
Potassium: 3.7 mmol/L (ref 3.5–5.1)
SODIUM: 136 mmol/L (ref 135–145)

## 2014-07-08 LAB — HEPATITIS B SURFACE ANTIGEN: Hepatitis B Surface Ag: NEGATIVE

## 2014-07-08 LAB — GLUCOSE, CAPILLARY
GLUCOSE-CAPILLARY: 123 mg/dL — AB (ref 70–99)
GLUCOSE-CAPILLARY: 118 mg/dL — AB (ref 70–99)
GLUCOSE-CAPILLARY: 182 mg/dL — AB (ref 70–99)
Glucose-Capillary: 112 mg/dL — ABNORMAL HIGH (ref 70–99)
Glucose-Capillary: 115 mg/dL — ABNORMAL HIGH (ref 70–99)
Glucose-Capillary: 150 mg/dL — ABNORMAL HIGH (ref 70–99)

## 2014-07-08 LAB — MAGNESIUM: Magnesium: 2.7 mg/dL — ABNORMAL HIGH (ref 1.5–2.5)

## 2014-07-08 MED ORDER — SODIUM CHLORIDE 0.9 % IV SOLN: 100.0000 mL | INTRAVENOUS | Status: DC | PRN

## 2014-07-08 MED ORDER — PENTAFLUOROPROP-TETRAFLUOROETH EX AERO: 1.0000 "application " | INHALATION_SPRAY | CUTANEOUS | Status: DC | PRN

## 2014-07-08 MED ORDER — HEPARIN SODIUM (PORCINE) 1000 UNIT/ML DIALYSIS: 1000.0000 [IU] | INTRAMUSCULAR | Status: DC | PRN

## 2014-07-08 MED ORDER — LIDOCAINE HCL (PF) 1 % IJ SOLN: 5.0000 mL | INTRAMUSCULAR | Status: DC | PRN

## 2014-07-08 MED ORDER — HYDROCORTISONE NA SUCCINATE PF 100 MG IJ SOLR
50.0000 mg | Freq: Two times a day (BID) | INTRAMUSCULAR | Status: DC
  Administered 2014-07-08 – 2014-07-10 (×4): 50 mg via INTRAVENOUS
  Filled 2014-07-08 (×5): qty 1

## 2014-07-08 MED ORDER — ALTEPLASE 2 MG IJ SOLR
2.0000 mg | Freq: Once | INTRAMUSCULAR | Status: DC | PRN
Start: 2014-07-08 — End: 2014-07-08
  Filled 2014-07-08: qty 2

## 2014-07-08 MED ORDER — LIDOCAINE-PRILOCAINE 2.5-2.5 % EX CREA
1.0000 "application " | TOPICAL_CREAM | CUTANEOUS | Status: DC | PRN
  Filled 2014-07-08: qty 5

## 2014-07-08 MED ORDER — NEPRO/CARBSTEADY PO LIQD
237.0000 mL | ORAL | Status: DC | PRN
  Filled 2014-07-08: qty 237

## 2014-07-08 MED ORDER — HEPARIN SODIUM (PORCINE) 1000 UNIT/ML DIALYSIS: 20.0000 [IU]/kg | INTRAMUSCULAR | Status: DC | PRN

## 2014-07-08 NOTE — Progress Notes (Signed)
PULMONARY / CRITICAL CARE MEDICINE   Name: Clayton [name] MRN: [id number] DOB: Sep 22, 1945    ADMISSION DATE:  06/28/2014  REFERRING MD :  EDP   CHIEF COMPLAINT:  Hypotension   INITIAL PRESENTATION: 69 yo male with hx ESRD, CHF, DM, CAD, bilat BKA who receives all his care at [place].  He presented 2/14 after a fall at home with syncope.  He has had diarrhea x 2-3 weeks with negative GI w/u thus far at [facility] (was scheduled for upper/lower scope next week).  In ER was significantly hypotensive with SBP 60's, hypokalemic with K 2.4.  No sig fluid resuscitation given r/t ESRD and CHF.  PCCM called to admit.   STUDIES:  CT head 2/14>>> neg acute  2D echo 2/14>>> LVEF is severely depressed at approximately 20% with hypokinesis of the inferiore wall (base, mid), anterior wall (base, minimally mid)m basal lateral wall;, basal posterior wall; akinesis elsewhere. CXR 2/23 >> Bilateral hypoinflation accentuates the lung markings.there are confluent densities at both lung bases consistent with atelectasis or pneumonia. Pulmonary interstitial edema has improved somewhat.  SIGNIFICANT EVENTS: 2/14 >> Hypotension s/p 1L fluids - Neo &Dopa started 2/15 >> soft BP persist; On Neo;  2/16 >> Midodrine started by Renal; CRRT started; Neo switched to levo 2/17 >> C diff +; Vanc/Flagyl started 2/21- more alert 2/22 >> CRRT stopped 10am; Pressors off 3pm as BP allowed 2/23 >> vanc enema stopped  SUBJECTIVE: in hd, more awake  VITAL SIGNS: Temp:  [97.6 F (36.4 C)-98.7 F (37.1 C)] 98.1 F (36.7 C) (02/24 0428) Pulse Rate:  [39-115] 78 (02/24 0500) Resp:  [12-30] 13 (02/24 0500) BP: (75-117)/(43-77) 117/60 mmHg (02/24 0500) SpO2:  [83 %-100 %] 98 % (02/24 0500) Weight:  [170 lb 6.7 oz (77.3 kg)] 170 lb 6.7 oz (77.3 kg) (02/24 0459) HEMODYNAMICS:   VENTILATOR SETTINGS:   INTAKE / OUTPUT:  Intake/Output Summary (Last 24 hours) at 07/08/14 0646 Last data filed at 07/08/14 0500  Gross per 24 hour   Intake   1365 ml  Output    150 ml  Net   1215 ml    PHYSICAL EXAMINATION: General:  Chronically ill appearing male, NAD. Drowsy. Appropriate  Neuro:  Alert; Oriented X 2 HEENT:  Mm dry; increase JVD  Cardiovascular:  s1s2 irreg; murmur present  Lungs: CTA, Abdomen:  Soft, +bs active, continued to be non tender  Musculoskeletal:  bilat BKA   LABS:  CBC  Recent Labs Lab 07/05/14 0330 07/06/14 0400 07/07/14 0455  WBC 6.1 6.0 3.7*  HGB 11.1* 11.0* 9.5*  HCT 34.0* 32.9* 28.6*  PLT 57* 69* 50*   Coag's  Recent Labs Lab 07/04/14 0500 07/05/14 0330 07/06/14 0400  APTT 61* 49* 44*   BMET  Recent Labs Lab 07/05/14 1600 07/06/14 0400 07/07/14 0455  NA 136 135 137  K 3.9 3.9 3.7  CL 103 101 106  CO2 26 25 26   BUN 10 12 23   CREATININE 2.01* 2.09* 3.03*  GLUCOSE 154* 159* 130*   Electrolytes  Recent Labs Lab 07/05/14 0330 07/05/14 1600 07/06/14 0400 07/07/14 0455  CALCIUM 7.9* 7.7* 7.6* 7.7*  MG 2.4  --  2.3 2.4  PHOS 2.4 2.1* 2.5  --    Sepsis Markers No results for input(s): LATICACIDVEN, PROCALCITON, O2SATVEN in the last 168 hours. ABG  Recent Labs Lab 07/04/14 1536  PHART 7.376  PCO2ART 40.9  PO2ART 86.0   Liver Enzymes  Recent Labs Lab 07/05/14 1600 07/06/14 0400 07/07/14 0455  AST  --   --  27  ALT  --   --  33  ALKPHOS  --   --  95  BILITOT  --   --  1.5*  ALBUMIN 2.3* 2.5* 2.3*   Cardiac Enzymes No results for input(s): TROPONINI, PROBNP in the last 168 hours. Glucose  Recent Labs Lab 07/07/14 0801 07/07/14 1201 07/07/14 1630 07/07/14 1937 07/07/14 2356 07/08/14 0427  GLUCAP 117* 143* 173* 138* 112* 123*    Imaging Dg Chest Port 1 View  07/07/2014   CLINICAL DATA:  Shortness of breath, history of CHF and end-stage renal disease, C difficile colitis.  EXAM: PORTABLE CHEST - 1 VIEW  COMPARISON:  Portable chest x-ray of June 30, 2014  FINDINGS: The lungs remain hypoinflated. The interstitial markings have improved.  There is alveolar density just above the right hemidiaphragm. The left hemidiaphragm is largely obscured. The cardiac silhouette remains enlarged. The permanent pacemaker defibrillator is in appropriate position radiographically. A left internal jugular venous catheter tip projects in the region of the junction of the right and left brachiocephalic veins.  IMPRESSION: Bilateral hypoinflation accentuates the lung markings. However, there are confluent densities at both lung bases consistent with atelectasis or pneumonia. Pulmonary interstitial edema has improved somewhat. The cardiac silhouette remains enlarged.   Electronically Signed   By: David  Martinique   On: 07/07/2014 07:46    ASSESSMENT / PLAN:  PULMONARY Pulmonary HTN Central sleep apnea witnessed  P:   Supplemental O2 as needed  IS For HD  CARDIOVASCULAR CVL  2/14>>> CAD; HFrEF (20%)  DM Smoldering septic shock from cdiff cardiomyopathy 20% HTN  Pacemaker/ AICD NSTEMI [facility]. Unstable for cath P:  KVO fluids  Midodrine started 2/16 Pressors: Off 2/22 @ 3pm Stress dose steroids: tapering, reduced to BID dosing 2/24  Will discuss with cards any role to add systemic heparin now Avoid vaso with ischemia risk Baseline LOW BP, sys 85-90 goal   RENAL ESRD - MWF HD - did get full HD Friday without difficulty  Hypokalemia  P:   Renal following; CRRT stopped 2/22 @ 10am. Will attempt IHD 2/24 Was pos 1200cc in last 24hrs   GASTROINTESTINAL Diarrhea - ongoing x 3weeks.  Denies abd pain, nausea. C diff + 2/17 P:   pepcid  Avoiding PPI Diet tolerated slp important,  HEMATOLOGIC Thrombocytopenia improving leukopenia P:  F/u CBC Plts trend after volume off, no sub q heparin   INFECTIOUS PMC P:   BCx >> NGTD Vanc 2/15 >> 2/17 day 3/3 Zosyn 2/15 >>2/17  Day 3/3  Oral Vanc 2/17 >> Day 8/10-14 Metronidazole 2/17 >> 8/10-14 vanc enema  2/20>>>2/23 Resume oral vanc 2/21>>>  ENDOCRINE DM  Relative adrenal insuff P:   SSI Weaning stress dose steroids  NEUROLOGIC Syncope - suspect r/t significant hypotension/ volume depletion- Resolved CT head negative Hypoactive delirium  P:   Supportive care Avoid benzo  FAMILY  - Updates: updated at bedside 2/15  - Inter-disciplinary family meet or Palliative Care meeting completed 2/18- [name], revisit Friday goals  Advanced directive revealed he wanted to have pain and suffer with NO benefit. This directive is bothersome. The fact that an attorney would allow a patient to sign this document is despicable. May need to clarify how this document was produced and push forward.   Olam Idler, MD 07/08/2014, 6:46 AM PGY-2, [facility]  STAFF NOTE: Linwood Dibbles, MD FACP have personally reviewed patient's  available data, including medical history, events of note, physical examination and test results as part of my evaluation. I have discussed with resident/NP and other care providers such as pharmacist, RN and RRT. In addition, I personally evaluated patient and elicited key findings of: in hd , follow BP response, to triad , med floor, continued cdiff treatment, pall care needs to address this awful advanced directive, updated wife myself this am , more awke, abdo exam re assuring, consider MAP 50 with good MS  Lavon Paganini. [name], MD, [place] Pgr: [facility] 07/08/2014 10:15 AM

## 2014-07-08 NOTE — Progress Notes (Signed)
Report called to receiving RN, Frederico Hamman, all questions answered.  Patient to be transferred from [place] room 4 to 5W room 8.  Patient's meds, chart, and personal belongings all sent with patient.  Daughter present during transfer.  Patient transferred via bed by myself and NT.  Safety measures maintained.  Doran Clay, RN

## 2014-07-08 NOTE — Progress Notes (Signed)
Assessment/Plan: 69 year old BM with multiple medical issues including ESRD MWF at [facility] presents with diarrhea/syncope 1 syncope/hypotension- cardiogenic v sepsis 2 ESRD: normally MWF via AVF - no regular HD since Friday 2/12- CRRT started 2/16 via vascath - off CRRT,  IHD done today; can remove IJ catheter 3 C Diff 4 Hgb variation  Subjective: Interval History: 500cc of with HD today  Objective: Vital signs in last 24 hours: Temp:  [96.6 F (35.9 C)-98.2 F (36.8 C)] 97.6 F (36.4 C) (02/24 1611) Pulse Rate:  [66-106] 86 (02/24 1600) Resp:  [12-28] 18 (02/24 1600) BP: (86-117)/(49-75) 101/56 mmHg (02/24 1600) SpO2:  [84 %-100 %] 87 % (02/24 1600) Weight:  [77.3 kg (170 lb 6.7 oz)-79.4 kg (175 lb 0.7 oz)] 79.4 kg (175 lb 0.7 oz) (02/24 1110) Weight change: -0.1 kg (-3.5 oz)  Intake/Output from previous day: 02/23 0701 - 02/24 0700 In: [id number] [P.O.:800; I.V.:225; IV Piggyback:350] Out: 150 [Stool:150] Intake/Output this shift: Total I/O In: 900 [P.O.:450; I.V.:80; Other:50; NG/GT:120; IV Piggyback:200] Out: 30 [Stool:150]  General appearance: alert  RIJ HD catheter  Lab Results:  Recent Labs  07/07/14 0455 07/08/14 0500  WBC 3.7* 3.9*  HGB 9.5* 10.0*  HCT 28.6* 29.8*  PLT 50* 46*   BMET:  Recent Labs  07/07/14 0455 07/08/14 0500  NA 137 136  K 3.7 3.7  CL 106 105  CO2 26 25  GLUCOSE 130* 121*  BUN 23 35*  CREATININE 3.03* 4.03*  CALCIUM 7.7* 7.8*   No results for input(s): PTH in the last 72 hours. Iron Studies: No results for input(s): IRON, TIBC, TRANSFERRIN, FERRITIN in the last 72 hours. Studies/Results: Dg Chest Port 1 View  07/07/2014   CLINICAL DATA:  Shortness of breath, history of CHF and end-stage renal disease, C difficile colitis.  EXAM: PORTABLE CHEST - 1 VIEW  COMPARISON:  Portable chest x-ray of June 30, 2014  FINDINGS: The lungs remain hypoinflated. The interstitial markings have improved. There is alveolar density just above the  right hemidiaphragm. The left hemidiaphragm is largely obscured. The cardiac silhouette remains enlarged. The permanent pacemaker defibrillator is in appropriate position radiographically. A left internal jugular venous catheter tip projects in the region of the junction of the right and left brachiocephalic veins.  IMPRESSION: Bilateral hypoinflation accentuates the lung markings. However, there are confluent densities at both lung bases consistent with atelectasis or pneumonia. Pulmonary interstitial edema has improved somewhat. The cardiac silhouette remains enlarged.   Electronically Signed   By: David  Martinique   On: 07/07/2014 07:46   Scheduled: . antiseptic oral rinse  7 mL Mouth Rinse BID  . antiseptic oral rinse  7 mL Mouth Rinse BID  . famotidine (PEPCID) IV  20 mg Intravenous Q24H  . feeding supplement (RESOURCE BREEZE)  1 Container Oral TID BM  . hydrocortisone sodium succinate  50 mg Intravenous Q12H  . insulin aspart  0-9 Units Subcutaneous 6 times per day  . metronidazole  500 mg Intravenous Q8H  . midodrine  10 mg Oral BID WC  . vancomycin  500 mg Oral 4 times per day     LOS: 10 days   Clayton [name] C 07/08/2014,4:28 PM

## 2014-07-08 NOTE — Progress Notes (Signed)
NURSING PROGRESS NOTE  Clayton [name] [id number] Admission Data: 07/08/2014 6:59 PM Attending Provider: Collene Gobble, MD [id number], PA-C Code Status: full  Allergies:  Simvastatin Past Medical History:   has a past medical history of Renal disorder; CHF (congestive heart failure); Diabetes mellitus without complication; Coronary artery disease; and S/P bilateral BKA (below knee amputation). Past Surgical History:   has past surgical history that includes Below knee leg amputation (Bilateral); Cardiac catheterization (1998/1999); and Cardiac defibrillator placement (2010). Social History:   reports that he has never smoked. He does not have any smokeless tobacco history on file. He reports that he does not drink alcohol or use illicit drugs.  Clayton [name] is a 69 y.o. male patient admitted from ED:   Last Documented Vital Signs: Blood pressure 88/55, pulse 58, temperature 97.8 F (36.6 C), temperature source Oral, resp. rate 20, height 6\' 5"  (1.956 m), weight 79.4 kg (175 lb 0.7 oz), SpO2 96 %.  Cardiac Monitoring:  No Telemetry Ordered.  IV Fluids:  IV in place, occlusive dsg intact without redness, IV cath wrist right, condition patent and no redness D5/0.9 NaCl.   Skin: Sacral Pressure Ulcer Stage II covered with Mepilex per RN report. Bilateral BKA. Rectal Tube. Expressed to oncoming RN to do a thorough skin assessment, and order wound consult.  Patient Admitted at [place]. Patient/Family orientated to room. Information packet given to patient/family. Admission inpatient armband information verified with patient/family to include name and date of birth and placed on patient arm. Side rails up x 2, fall assessment and education completed with patient/family. Patient/family able to verbalize understanding of risk associated with falls and verbalized understanding to call for assistance before getting out of bed. Call light within reach. Patient/family able to voice  and demonstrate understanding of unit orientation instructions.    Will continue to evaluate and treat per MD orders.   Hendricks Limes RN, BS, BSN

## 2014-07-08 NOTE — Progress Notes (Signed)
SUBJECTIVE:  Lethargic but responsive. Disoriented. Talking but not making sense.   OBJECTIVE:   Vitals:   Filed Vitals:   07/08/14 0400 07/08/14 0428 07/08/14 0459 07/08/14 0500  BP: 105/66   117/60  Pulse: 102   78  Temp:  98.1 F (36.7 C)    TempSrc:  Oral    Resp: 19   13  Height:      Weight:   170 lb 6.7 oz (77.3 kg)   SpO2: 97%   98%   I&O's:    Intake/Output Summary (Last 24 hours) at 07/08/14 0741 Last data filed at 07/08/14 0500  Gross per 24 hour  Intake   1355 ml  Output    150 ml  Net   1205 ml   TELEMETRY: Reviewed telemetry- paced rhythm. Occ. PVCs, multiple runs of NSVT- longest 19 beats this am.    PHYSICAL EXAM General: thin frail, in no acute distress  Lungs:   Clear bilaterally to auscultation and percussion anteriorly. Heart:   RRR S1 S2 Pulses are 2+ & equal. Abdomen: Bowel sounds are positive, abdomen soft and non-tender without masses  Extremities:   Bilateral BKA  LABS: Basic Metabolic Panel:  Recent Labs  07/06/14 0400 07/07/14 0455 07/08/14 0500  NA 135 137 136  K 3.9 3.7 3.7  CL 101 106 105  CO2 25 26 25   GLUCOSE 159* 130* 121*  BUN 12 23 35*  CREATININE 2.09* 3.03* 4.03*  CALCIUM 7.6* 7.7* 7.8*  MG 2.3 2.4 2.7*  PHOS 2.5  --  3.8   Liver Function Tests:  Recent Labs  07/07/14 0455 07/08/14 0500  AST 27  --   ALT 33  --   ALKPHOS 95  --   BILITOT 1.5*  --   PROT 6.1  --   ALBUMIN 2.3* 2.4*   No results for input(s): LIPASE, AMYLASE in the last 72 hours. CBC:  Recent Labs  07/06/14 0400 07/07/14 0455  WBC 6.0 3.7*  NEUTROABS  --  3.1  HGB 11.0* 9.5*  HCT 32.9* 28.6*  MCV 85.7 86.1  PLT 69* 50*   Coag Panel:   Lab Results  Component Value Date   INR 2.26* 07/01/2014   INR 2.47* 06/30/2014   INR 2.38* 06/29/2014    RADIOLOGY: Dg Chest 2 View  06/28/2014   CLINICAL DATA:  Syncope  EXAM: CHEST  2 VIEW  COMPARISON:  03/25/2010  FINDINGS: There are intact appearances of the transvenous leads. There is  unchanged moderate cardiomegaly. There is mild interstitial thickening and central vascular congestion which is new from 2011. This likely represents a degree of congestive heart failure. No confluent alveolar opacities are evident. No effusions are evident.  IMPRESSION: Probable mild congestive heart failure.   Electronically Signed   By: Andreas Newport M.D.   On: 06/28/2014 22:01   Dg Abd 1 View  07/02/2014   CLINICAL DATA:  Abdominal distension.  EXAM: ABDOMEN - 1 VIEW  COMPARISON:  None.  FINDINGS: Moderate distention of the stomach with error. No bowel distention is seen to suggest obstruction. Soft tissues are poorly defined. No convincing renal or ureteral stones. There are vascular calcifications along the aorta and its branch vessels.  IMPRESSION: 1. No evidence of bowel obstruction. Moderate distention of the stomach, nonspecific.   Electronically Signed   By: Lajean Manes M.D.   On: 07/02/2014 14:53   Ct Head Wo Contrast  06/28/2014   CLINICAL DATA:  Two episodes of syncope today  EXAM: CT HEAD WITHOUT CONTRAST  TECHNIQUE: Contiguous axial images were obtained from the base of the skull through the vertex without intravenous contrast.  COMPARISON:  None.  FINDINGS: The ventricles are normal in size and configuration. There is no intracranial mass, hemorrhage, extra-axial fluid collection, or midline shift. There is slight small vessel disease in the centra semiovale bilaterally. Elsewhere, gray-white compartments appear normal. No acute infarct apparent. The bony calvarium appears intact. The mastoid air cells are clear. There is a small air-fluid level in the right maxillary antrum with a a retention cyst along the anterior inferior right maxillary antrum. There is rightward deviation of the nasal septum.  IMPRESSION: Mild periventricular small vessel disease. No intracranial mass, hemorrhage, or acute appearing infarct. Evidence of right maxillary sinus disease.   Electronically Signed   By:  Lowella Grip III M.D.   On: 06/28/2014 21:30   Dg Chest Port 1 View  07/07/2014   CLINICAL DATA:  Shortness of breath, history of CHF and end-stage renal disease, C difficile colitis.  EXAM: PORTABLE CHEST - 1 VIEW  COMPARISON:  Portable chest x-ray of June 30, 2014  FINDINGS: The lungs remain hypoinflated. The interstitial markings have improved. There is alveolar density just above the right hemidiaphragm. The left hemidiaphragm is largely obscured. The cardiac silhouette remains enlarged. The permanent pacemaker defibrillator is in appropriate position radiographically. A left internal jugular venous catheter tip projects in the region of the junction of the right and left brachiocephalic veins.  IMPRESSION: Bilateral hypoinflation accentuates the lung markings. However, there are confluent densities at both lung bases consistent with atelectasis or pneumonia. Pulmonary interstitial edema has improved somewhat. The cardiac silhouette remains enlarged.   Electronically Signed   By: David  Martinique   On: 07/07/2014 07:46   Dg Chest Port 1 View  06/30/2014   CLINICAL DATA:  Left-sided hemodialysis catheter placement  EXAM: PORTABLE CHEST - 1 VIEW  COMPARISON:  06/29/2014  FINDINGS: New left IJ approach dual lumen dialysis catheter has been placed with tip over the mid SVC. No pneumothorax. Right IJ line has been removed. No significant change otherwise. Gaseous gastric distention is partly visualized.  IMPRESSION: Support apparatus as above.   Electronically Signed   By: Conchita Paris M.D.   On: 06/30/2014 14:25   Dg Chest Portable 1 View  06/29/2014   CLINICAL DATA:  Central line placement.  Subsequent evaluation.  EXAM: PORTABLE CHEST - 1 VIEW  COMPARISON:  Chest radiograph June 28, 2014  FINDINGS: The cardiac silhouette appears at least mild apparently enlarged coming with considerations low inspiratory examination. Cauda vascular markings without pleural effusion or focal consolidation.  Calcified aortic knob. Strandy densities LEFT lung base.  Interval placement RIGHT internal jugular central venous catheter with distal tip projecting cavoatrial junction. No pneumothorax. Four lead LEFT cardiac defibrillator in situ. Soft tissue planes and included osseous structures are nonsuspicious. Moderate degenerative change of the thoracic spine.  IMPRESSION: RIGHT internal jugular central venous catheter distal tip projects cavoatrial junction. No pneumothorax.  Cardiomegaly.  LEFT lung base atelectasis/scar.   Electronically Signed   By: Elon Alas   On: 06/29/2014 02:05    ASSESSMENT: Kathyrn Lass:   1) NSTEMI: High risk for CAD with PAD and ESRD. Ischemic cardiomyopathy, low EF 20%. Off pressors for the last 24 hours with fairly stable BP.   Not  a candidate for cardiac cath or advanced heart failure therapies due to multisystem disease. Plan for dialysis today. Will see if BP will support  dialysis. Prognosis remains very poor.   2) Acute on chronic CHF with cardiomyopathy EF 20%. Weight is stable. I/O positive 1.2 liters. Plan for HD today. Not a candidate for any oral HF therapy due to hypotension.  3) Palliative being consulted for goals of care. Not much to offer from a cardiac standpoint unless he improves significantly from his other medical issues. No cardiac sx at this time.  4. C. Difficile colitis with sepsis  5. ESRD  6. NSVT asymptomatic. If he has sustained or symptomatic VT would need to consider amiodarone. Potassium and magnesium levels were normal yesterday. May improve with dialysis.     Allisen Pidgeon [place], MD  07/08/2014  7:41 AM

## 2014-07-08 NOTE — Progress Notes (Signed)
Speech Language Pathology Treatment: Dysphagia  Patient Details Name: Clayton [name] MRN: [id number] DOB: 1945-10-25 Today's Date: 07/08/2014 Time: [id number] SLP Time Calculation (min) (ACUTE ONLY): 12 min  Assessment / Plan / Recommendation Clinical Impression  Pt appears to have fluctuating PO toleration related primarily to MS.  Today, Clayton [name] was alert and oriented.  RR 22; SPo2 97% on room air; all respiratory parameters within range of adequate swallow-ventilatory coordination.  He demonstrated no clinical signs of dysphagia excluding multiple swallows per bolus.  There was no coughing nor wet phonation post-swallow.  NG is present for suctioning; does not appear to be interfering with swallowing safety. CXR from 2/23 reveals confluent densities at both lung bases consistent with atelectasis or pneumonia. Pulmonary interstitial edema has improved somewhat.    Recommend resuming a PO diet - full liquids may be best, after discussion with RN.  Only allow POs when pt is sufficiently alert.  SLP will follow briefly for toleration give fluctuation last several days.     HPI HPI: 69 yo male with multiple medical problems who presented with several week history of diarrhea, syncopal episode. Found to have NSTEMI, persistent hypotension, c-diff. RN reports difficulty swallowing.    Pertinent Vitals Pain Assessment: No/denies pain  SLP Plan  Continue with current plan of care    Recommendations Diet recommendations:  (full liquid diet) Liquids provided via: Cup;Straw Medication Administration: Crushed with puree Supervision: Full supervision/cueing for compensatory strategies Compensations: Slow rate;Small sips/bites Postural Changes and/or Swallow Maneuvers: Seated upright 90 degrees              Oral Care Recommendations: Oral care BID Plan: Continue with current plan of care    Deby Adger L. Tivis Ringer, [place] CCC/SLP Pager [phone]      Clayton [name] 07/08/2014, 12:58  PM

## 2014-07-09 DIAGNOSIS — E119 Type 2 diabetes mellitus without complications: Secondary | ICD-10-CM

## 2014-07-09 DIAGNOSIS — R55 Syncope and collapse: Secondary | ICD-10-CM

## 2014-07-09 LAB — GLUCOSE, CAPILLARY
GLUCOSE-CAPILLARY: 135 mg/dL — AB (ref 70–99)
GLUCOSE-CAPILLARY: 198 mg/dL — AB (ref 70–99)
Glucose-Capillary: 109 mg/dL — ABNORMAL HIGH (ref 70–99)
Glucose-Capillary: 170 mg/dL — ABNORMAL HIGH (ref 70–99)

## 2014-07-09 MED ORDER — METRONIDAZOLE 500 MG PO TABS
500.0000 mg | ORAL_TABLET | Freq: Three times a day (TID) | ORAL | Status: DC
  Administered 2014-07-09 – 2014-07-10 (×6): 500 mg via ORAL
  Filled 2014-07-09 (×8): qty 1

## 2014-07-09 MED ORDER — FAMOTIDINE 20 MG PO TABS
20.0000 mg | ORAL_TABLET | Freq: Every day | ORAL | Status: DC
  Administered 2014-07-10: 20 mg via ORAL
  Filled 2014-07-09: qty 1

## 2014-07-09 NOTE — Clinical Social Work Psychosocial (Signed)
Clinical Social Work Department BRIEF PSYCHOSOCIAL ASSESSMENT 07/09/2014  Patient:  Clayton [name], Clayton [name]     Account Number:  [id number]     Admit date:  06/28/2014  Clinical Social Worker:  Lovey Newcomer  Date/Time:  07/09/2014 04:54 PM  Referred by:  Physician  Date Referred:  07/09/2014 Referred for  SNF Placement   Other Referral:   NA   Interview type:  Family Other interview type:   Patient's wife Keely interviewed by phone to complete assessment.    PSYCHOSOCIAL DATA Living Status:  WIFE Admitted from facility:   Level of care:   Primary support name:  Keely Primary support relationship to patient:  SPOUSE Degree of support available:   Support is good.    CURRENT CONCERNS Current Concerns  Post-Acute Placement   Other Concerns:   NA    SOCIAL WORK ASSESSMENT / PLAN CSW spoke with patient's wife by phone to complete assessment. Patient was admitted from home where he lives with his wife. CSW received consult today stating that the patient would require SNF placement at discharge. Patient's wife Barbaraann Rondo confirms that the patient will need placement when medically stable. Per wife, she has become overwhelmed by the care needs of the patient and she can no longer manage him at home. She requests CSW's assistance with long term care placement. The wife insists that the patient will not be using his Medicare for placement but that he will be using his VA benefits for SNF placement. She states that they do have experience with VA SNF placement as the patient has been to the [facility] and two contracted facilities [facility] Wife reports that their experiences with the two contracted facilities was "horrible" and that she would not let the patient be placed in a "contracted facility". Wife gave CSW contact information for Carlyle Dolly (ext. [phone]) who is the patient's outpatient social worker.    CSW spoke with Sophia  who states that the patient has 100% service connected disability and would be eligible for SNF placemend. Sophia states that the patient would likely not get a bed at the [facility] as the [place] usually takes short term patient's and the wife is requesting long term placement for the patient. Sophia provided number for Melodie Bouillon (ext. [phone]) who confirms that [facility] would not be able to offer a long term bed for the patient. Sophia also provided number for Shanda Howells (Ext. [phone]) who is the [facility] contracted SNF coordinator. CSW has left multiple messages with Melissa requesting assistance with getting the patient placed in a contracted facility.    Patient's wife continues to insist that the patient be placed at [facility]. CSW has encouraged wife to speak with Melodie Bouillon as long term placement at the [facility] is supposedly not a viable option for the patient. CSW also explained to patient's wife that she could utilize the patient's Medicare benefits for placement if she didn't want the patient placed at a [place] contracted SNF.    CSW notes that the patient is receiving dialysis MWF, patient's placement will need to be coordinated so that patient will be able to receive dialysis post DC while at SNF. CSW will continue to try and get assistance from [facility] on placing patient in SNF.   Assessment/plan status:  Psychosocial Support/Ongoing Assessment of Needs Other assessment/ plan:   Complete Fl2, Fax, PASRR   Information/referral to community resources:   CSW contact information given. CSW will provide wife  with any available bed offers and new information as it becomes available.    PATIENT'S/FAMILY'S RESPONSE TO PLAN OF CARE: Patient's wife does not want patient discharged to a VA contracted SNF, but is aware that this will likely not be an option if she insists on placement and does not want to utilize Medicare benefits. Overall patient's wife is frustrated because she feels the patient should be able to go to [facility]  in [place]. Wife states she will try to help in placement process and also try to get more information from [place]. CSW will continue to follow.       Liz Beach MSW, [place], [place], JI:[id number]

## 2014-07-09 NOTE — Progress Notes (Signed)
Speech Language Pathology Treatment: Dysphagia  Patient Details Name: Clayton [name] MRN: [id number] DOB: 18-Feb-1946 Today's Date: 07/09/2014 Time: 2811-8867 SLP Time Calculation (min) (ACUTE ONLY): 15 min  Assessment / Plan / Recommendation Clinical Impression  F/u to ensure safety with POs.  Pt presents with excellent toleration of his full liquid diet with improved attention to POs, swift swallow response, rapid consumption of thin liquids with no overt s/s of aspiration.  Pt remains on liquid diet due to intestinal issues; he may be advanced to regular per MD discretion.    Please remove mitts during meals to allow for self-feeding, and elevate HOB to maximize safety.  No further SLP f/u warranted.  Spoke with dtr, who is in agreement.     HPI HPI: 69 yo male with multiple medical problems who presented with several week history of diarrhea, syncopal episode. Found to have NSTEMI, persistent hypotension, c-diff. RN reports difficulty swallowing.       SLP Plan  All goals met    Recommendations Diet recommendations:  (advance per MD) Liquids provided via: Cup;Straw Medication Administration: Crushed with puree Supervision: Full supervision/cueing for compensatory strategies Compensations: Slow rate;Small sips/bites Postural Changes and/or Swallow Maneuvers: Seated upright 90 degrees              Oral Care Recommendations: Oral care BID Plan: All goals met   Clayton [name] L. Tivis Ringer, [place] CCC/SLP Pager [phone]      Clayton [name] 07/09/2014, 9:53 AM

## 2014-07-09 NOTE — Progress Notes (Signed)
Assessment/Plan: 69 year old BM with multiple medical issues including ESRD MWF at [facility] presents with diarrhea/syncope 1 syncope/hypotension- cardiogenic v sepsis 2 ESRD: normally MWF via AVF -  - off CRRT, IHD done Wed, next tx Friday 3 C Diff 4 Hgb variation  Subjective: Interval History: None  Objective: Vital signs in last 24 hours: Temp:  [97.5 F (36.4 C)-98.3 F (36.8 C)] 97.5 F (36.4 C) (02/25 0535) Pulse Rate:  [58-106] 93 (02/25 0615) Resp:  [18-28] 20 (02/25 0535) BP: (87-139)/(50-109) 129/52 mmHg (02/25 0615) SpO2:  [87 %-100 %] 99 % (02/25 0535) Weight change: 2.1 kg (4 lb 10.1 oz)  Intake/Output from previous day: 02/24 0701 - 02/25 0700 In: 1370 [P.O.:630; I.V.:220; NG/GT:120; IV Piggyback:350] Out: 61 [Stool:150] Intake/Output this shift: Total I/O In: 300 [P.O.:300] Out: -   confused,[place] when prompted[facility]   Lab Results:  Recent Labs  07/07/14 0455 07/08/14 0500  WBC 3.7* 3.9*  HGB 9.5* 10.0*  HCT 28.6* 29.8*  PLT 50* 46*   BMET:  Recent Labs  07/07/14 0455 07/08/14 0500  NA 137 136  K 3.7 3.7  CL 106 105  CO2 26 25  GLUCOSE 130* 121*  BUN 23 35*  CREATININE 3.03* 4.03*  CALCIUM 7.7* 7.8*   No results for input(s): PTH in the last 72 hours. Iron Studies: No results for input(s): IRON, TIBC, TRANSFERRIN, FERRITIN in the last 72 hours. Studies/Results: No results found.  Scheduled: . antiseptic oral rinse  7 mL Mouth Rinse BID  . antiseptic oral rinse  7 mL Mouth Rinse BID  . [START ON 07/10/2014] famotidine  20 mg Oral Daily  . feeding supplement (RESOURCE BREEZE)  1 Container Oral TID BM  . hydrocortisone sodium succinate  50 mg Intravenous Q12H  . insulin aspart  0-9 Units Subcutaneous 6 times per day  . metroNIDAZOLE  500 mg Oral 3 times per day  . midodrine  10 mg Oral BID WC  . vancomycin  500 mg Oral 4 times per day     LOS: 11 days   Tarl Cephas C 07/09/2014,12:57 PM

## 2014-07-09 NOTE — Progress Notes (Signed)
[place] TEAM 1 - Stepdown/ICU TEAM Progress Note  Clayton [name] [id number] DOB: [birth year] DOA: 06/28/2014 PCP: Jan Fireman  Admit HPI / Brief Narrative: 69 year old BM PMHx  ESRD M/W F, CHF, DM, CAD, bilateral BKA, and chronic diarrhea, that presented to [facility] on 07/04/12 after a fall and syncopal episode at home. Patient receives his care at the [place].  Patient is scheduled for an upper/lower scope next week.  In the ED, he was significantly hypotensive systolic blood pressures in the 60s.  K2.4.  Nephrology was consulted, and PCM admitted patient. He was placed on pressors, and CRRT was started by renal.  Patient was able to be weaned off pressors, CRRT was discontinued, hemodialysis was resumed Wed 02/24. [facility] then [facility].  HPI/Subjective:  A and O X2. (Does not know where, when) Disoriented, follows commands. Denies any pain. Per sister patient had been noncommunicative other than head nods and grunts prior to hospitalization. Per sister and daughter a family meeting is scheduled for Saturday (goals care?)  Assessment/Plan:  Syncope and collapse/hypotension -Likely secondary to hypotension/volume depletion -CT head no acute abnormalities  C. Difficile with Septic Shock -stress dose steroids tapered to 50mg  BID dosing -cont oral flagyl and IV Vancomycin  NSTEMI -appreciate Cards rec's- not cath candidate due to multisystem disease, poor prognosis  Acute on chronic CHF -2-D echo 02/14-EF 20%, with hypokinesis, and severely reduced systolic function -CXR 0000000- some improvement in interstitial edema, densities at both lung bases consistent with atelectasis, vs PNA -Strict I and O: net pos 2.0 L  -daily weights: admission 72.1 kg.  2/25: 79.4kg -Per Cards- not candidate for any oral HF therapy due to hypotension  ESRD dialysis M/W/F -last dialysis Wed 02/24, next Friday -nephrology on board  NSVT -pt asymtpomatic -Per Cards- if sustained of  symptomatic reconsider amiodarone  DM -CBGs stable -SSI -Lipid panel pending -Hemoglobin A1c pending  Code Status: FULL Family Communication: no family present at time of exam Disposition Plan: med surge   Consultants: [facility] [place]  Procedure/Significant Events: 2-D echo 02/14-EF 20%, with hypokinesis, and severely reduced systolic function   Culture Blood>>NGTD   Antibiotics: Vancomycin 2/17 >>day 9 Flagyl 2/17 >> day 9 Vanc enema 2/20-2/23 Oral Vanc resumed  2/21  DVT prophylaxis: Bilat BKA   Devices AVF  LINES / TUBES:  None    Continuous Infusions: . dextrose 5 % and 0.9% NaCl 10 mL/hr at 07/08/14 1500    Objective: VITAL SIGNS: Temp: 98.6 F (37 C) (02/25 1330) Temp Source: Oral (02/25 1330) BP: 105/63 mmHg (02/25 1330) Pulse Rate: 104 (02/25 1330) SPO2; FIO2:   Intake/Output Summary (Last 24 hours) at 07/09/14 1557 Last data filed at 07/09/14 1009  Gross per 24 hour  Intake    770 ml  Output      0 ml  Net    770 ml     Exam: General: chronically sick looking AA male, in NAD Lungs: Clear to auscultation bilaterally without wheezes or crackles Cardiovascular: Regular rate and rhythm without murmur gallop or rub normal S1 and S2 Abdomen: Nontender, nondistended, soft, bowel sounds positive, no rebound, no ascites, no appreciable mass Extremities: bilat BKA Data Reviewed: Basic Metabolic Panel:  Recent Labs Lab 07/04/14 0500 07/04/14 1600 07/05/14 0330 07/05/14 1600 07/06/14 0400 07/07/14 0455 07/08/14 0500  NA 135 136 137 136 135 137 136  K 3.4* 3.5 4.3 3.9 3.9 3.7 3.7  CL 103 103 101 103 101 106  105  CO2 26 25 28 26 25 26 25   GLUCOSE 126* 106* 135* 154* 159* 130* 121*  BUN 10 11 9 10 12 23  35*  CREATININE 2.29* 2.38* 2.25* 2.01* 2.09* 3.03* 4.03*  CALCIUM 7.5* 7.7* 7.9* 7.7* 7.6* 7.7* 7.8*  MG 2.2  --  2.4  --  2.3 2.4 2.7*  PHOS 2.1* 2.2* 2.4 2.1* 2.5  --  3.8   Liver Function  Tests:  Recent Labs Lab 07/05/14 0330 07/05/14 1600 07/06/14 0400 07/07/14 0455 07/08/14 0500  AST  --   --   --  27  --   ALT  --   --   --  33  --   ALKPHOS  --   --   --  95  --   BILITOT  --   --   --  1.5*  --   PROT  --   --   --  6.1  --   ALBUMIN 2.5* 2.3* 2.5* 2.3* 2.4*   No results for input(s): LIPASE, AMYLASE in the last 168 hours. No results for input(s): AMMONIA in the last 168 hours. CBC:  Recent Labs Lab 07/03/14 0500 07/05/14 0330 07/06/14 0400 07/07/14 0455 07/08/14 0500  WBC 3.9* 6.1 6.0 3.7* 3.9*  NEUTROABS  --   --   --  3.1  --   HGB 11.0* 11.1* 11.0* 9.5* 10.0*  HCT 33.9* 34.0* 32.9* 28.6* 29.8*  MCV 86.0 84.6 85.7 86.1 86.6  PLT 48* 57* 69* 50* 46*   Cardiac Enzymes: No results for input(s): CKTOTAL, CKMB, CKMBINDEX, TROPONINI in the last 168 hours. BNP (last 3 results) No results for input(s): BNP in the last 8760 hours.  ProBNP (last 3 results) No results for input(s): PROBNP in the last 8760 hours.  CBG:  Recent Labs Lab 07/08/14 1226 07/08/14 1609 07/08/14 2037 07/09/14 0008 07/09/14 0419  GLUCAP 115* 182* 150* 135* 109*    Recent Results (from the past 240 hour(s))  Culture, blood (routine x 2)     Status: None   Collection Time: 06/29/14  7:00 PM  Result Value Ref Range Status   Specimen Description BLOOD RIGHT HAND  Final   Special Requests   Final    BOTTLES DRAWN AEROBIC AND ANAEROBIC 5CC BLUE 3CC PURPLE   Culture   Final    NO GROWTH 5 DAYS Performed at [facility]    Report Status 07/06/2014 FINAL  Final  Culture, blood (routine x 2)     Status: None   Collection Time: 06/29/14  7:18 PM  Result Value Ref Range Status   Specimen Description BLOOD RIGHT ARM  Final   Special Requests BOTTLES DRAWN AEROBIC AND ANAEROBIC 3CC  Final   Culture   Final    NO GROWTH 5 DAYS Note: Culture results may be compromised due to an inadequate volume of blood received in culture bottles. Performed at [facility]    Report Status 07/06/2014 FINAL  Final  Clostridium Difficile by PCR     Status: Abnormal   Collection Time: 07/01/14  3:00 PM  Result Value Ref Range Status   C difficile by pcr POSITIVE (A) NEGATIVE Final    Comment: CRITICAL RESULT CALLED TO, READ BACK BY AND VERIFIED WITH: J. BISHOP RN 16:25 07/01/14 ([email or web address])      Studies:  Recent x-ray studies have been reviewed in detail by the Attending Physician  Scheduled Meds:  Scheduled Meds: . antiseptic oral rinse  7 mL Mouth  Rinse BID  . antiseptic oral rinse  7 mL Mouth Rinse BID  . [START ON 07/10/2014] famotidine  20 mg Oral Daily  . feeding supplement (RESOURCE BREEZE)  1 Container Oral TID BM  . hydrocortisone sodium succinate  50 mg Intravenous Q12H  . insulin aspart  0-9 Units Subcutaneous 6 times per day  . metroNIDAZOLE  500 mg Oral 3 times per day  . midodrine  10 mg Oral BID WC  . vancomycin  500 mg Oral 4 times per day    Time spent on care of this patient: 40 mins   Lacy Duverney , [facility]  [facility] Office  [phone] Pager - [phone]  On-Call/Text Page:      [email or web address]      password [email or web address]  If 7PM-7AM, please contact night-coverage [email or web address] Password [facility] 07/09/2014, 3:57 PM   LOS: 11 days   Examined Patient and discussed A&P with PA Sahar and agree with above plan.  I have reviewed the entire database. I have made any necessary editorial changes, and agree with its content. I have reviewed this patient's available data, including medical history, events of note, physical examination, radiology studies and test results as part of my evaluation Pt with Multiple Complex medical problems> 35 min spent in direct Pt care    Dia Crawford, MD [facility] [phone] pager

## 2014-07-10 DIAGNOSIS — I5033 Acute on chronic diastolic (congestive) heart failure: Secondary | ICD-10-CM | POA: Insufficient documentation

## 2014-07-10 DIAGNOSIS — I472 Ventricular tachycardia: Secondary | ICD-10-CM | POA: Insufficient documentation

## 2014-07-10 DIAGNOSIS — I4729 Other ventricular tachycardia: Secondary | ICD-10-CM | POA: Insufficient documentation

## 2014-07-10 DIAGNOSIS — E119 Type 2 diabetes mellitus without complications: Secondary | ICD-10-CM | POA: Insufficient documentation

## 2014-07-10 DIAGNOSIS — Z992 Dependence on renal dialysis: Secondary | ICD-10-CM

## 2014-07-10 DIAGNOSIS — N186 End stage renal disease: Secondary | ICD-10-CM | POA: Insufficient documentation

## 2014-07-10 DIAGNOSIS — R55 Syncope and collapse: Secondary | ICD-10-CM | POA: Insufficient documentation

## 2014-07-10 LAB — LIPID PANEL
Cholesterol: 115 mg/dL (ref 0–200)
HDL: 26 mg/dL — ABNORMAL LOW (ref >39)
LDL CALC: 71 mg/dL (ref 0–99)
TRIGLYCERIDES: 90 mg/dL (ref <150)
Total CHOL/HDL Ratio: 4.4 RATIO
VLDL: 18 mg/dL (ref 0–40)

## 2014-07-10 LAB — CBC WITH DIFFERENTIAL/PLATELET
BASOS ABS: 0 10*3/uL (ref 0.0–0.1)
Basophils Relative: 0 % (ref 0–1)
EOS PCT: 0 % (ref 0–5)
Eosinophils Absolute: 0 10*3/uL (ref 0.0–0.7)
HCT: 30.9 % — ABNORMAL LOW (ref 39.0–52.0)
Hemoglobin: 10.8 g/dL — ABNORMAL LOW (ref 13.0–17.0)
LYMPHS ABS: 0.4 10*3/uL — AB (ref 0.7–4.0)
Lymphocytes Relative: 7 % — ABNORMAL LOW (ref 12–46)
MCH: 29.4 pg (ref 26.0–34.0)
MCHC: 35 g/dL (ref 30.0–36.0)
MCV: 84.2 fL (ref 78.0–100.0)
MONO ABS: 0.6 10*3/uL (ref 0.1–1.0)
Monocytes Relative: 11 % (ref 3–12)
NEUTROS PCT: 82 % — AB (ref 43–77)
Neutro Abs: 4.7 10*3/uL (ref 1.7–7.7)
PLATELETS: 71 10*3/uL — AB (ref 150–400)
RBC: 3.67 MIL/uL — ABNORMAL LOW (ref 4.22–5.81)
RDW: 24.2 % — AB (ref 11.5–15.5)
WBC: 5.7 10*3/uL (ref 4.0–10.5)

## 2014-07-10 LAB — COMPREHENSIVE METABOLIC PANEL
ALK PHOS: 99 U/L (ref 39–117)
ALT: 23 U/L (ref 0–53)
AST: 20 U/L (ref 0–37)
Albumin: 2.5 g/dL — ABNORMAL LOW (ref 3.5–5.2)
Anion gap: 10 (ref 5–15)
BUN: 39 mg/dL — ABNORMAL HIGH (ref 6–23)
CHLORIDE: 105 mmol/L (ref 96–112)
CO2: 22 mmol/L (ref 19–32)
CREATININE: 4.91 mg/dL — AB (ref 0.50–1.35)
Calcium: 8.1 mg/dL — ABNORMAL LOW (ref 8.4–10.5)
GFR calc Af Amer: 13 mL/min — ABNORMAL LOW (ref >90)
GFR calc non Af Amer: 11 mL/min — ABNORMAL LOW (ref >90)
Glucose, Bld: 122 mg/dL — ABNORMAL HIGH (ref 70–99)
POTASSIUM: 3.5 mmol/L (ref 3.5–5.1)
Sodium: 137 mmol/L (ref 135–145)
TOTAL PROTEIN: 6.2 g/dL (ref 6.0–8.3)
Total Bilirubin: 2.1 mg/dL — ABNORMAL HIGH (ref 0.3–1.2)

## 2014-07-10 LAB — GLUCOSE, CAPILLARY
GLUCOSE-CAPILLARY: 118 mg/dL — AB (ref 70–99)
GLUCOSE-CAPILLARY: 120 mg/dL — AB (ref 70–99)
GLUCOSE-CAPILLARY: 121 mg/dL — AB (ref 70–99)
GLUCOSE-CAPILLARY: 122 mg/dL — AB (ref 70–99)
Glucose-Capillary: 127 mg/dL — ABNORMAL HIGH (ref 70–99)
Glucose-Capillary: 145 mg/dL — ABNORMAL HIGH (ref 70–99)
Glucose-Capillary: 160 mg/dL — ABNORMAL HIGH (ref 70–99)
Glucose-Capillary: 167 mg/dL — ABNORMAL HIGH (ref 70–99)

## 2014-07-10 LAB — MAGNESIUM: Magnesium: 2.3 mg/dL (ref 1.5–2.5)

## 2014-07-10 MED ORDER — ACETAMINOPHEN 325 MG PO TABS: 650.0000 mg | ORAL_TABLET | Freq: Four times a day (QID) | ORAL | Status: DC | PRN

## 2014-07-10 MED ORDER — BOOST / RESOURCE BREEZE PO LIQD: 1.0000 | Freq: Three times a day (TID) | ORAL | Status: AC

## 2014-07-10 MED ORDER — MIDODRINE HCL 10 MG PO TABS: 10.0000 mg | ORAL_TABLET | Freq: Three times a day (TID) | ORAL | Status: DC

## 2014-07-10 MED ORDER — HYDROCORTISONE NA SUCCINATE PF 100 MG IJ SOLR
25.0000 mg | Freq: Two times a day (BID) | INTRAMUSCULAR | Status: DC
  Administered 2014-07-10: 25 mg via INTRAVENOUS
  Filled 2014-07-10: qty 0.5

## 2014-07-10 MED ORDER — VANCOMYCIN 50 MG/ML ORAL SOLUTION: 500.0000 mg | Freq: Four times a day (QID) | ORAL | Status: DC

## 2014-07-10 MED ORDER — MIDODRINE HCL 5 MG PO TABS
10.0000 mg | ORAL_TABLET | Freq: Three times a day (TID) | ORAL | Status: DC
  Administered 2014-07-10 (×2): 10 mg via ORAL
  Filled 2014-07-10 (×3): qty 2

## 2014-07-10 MED ORDER — HYDROCORTISONE NA SUCCINATE PF 100 MG IJ SOLR: 25.0000 mg | Freq: Two times a day (BID) | INTRAMUSCULAR | Status: DC

## 2014-07-10 MED ORDER — INSULIN ASPART 100 UNIT/ML ~~LOC~~ SOLN: 0.0000 [IU] | SUBCUTANEOUS | Status: DC

## 2014-07-10 MED ORDER — OXYCODONE HCL 5 MG PO TABS
5.0000 mg | ORAL_TABLET | Freq: Four times a day (QID) | ORAL | Status: DC | PRN
  Administered 2014-07-10: 5 mg via ORAL
  Filled 2014-07-10: qty 1

## 2014-07-10 NOTE — Clinical Social Work Note (Addendum)
Per RNCM patient to be transferred to [facility]. CSW has sent in information for SNF approval just in case. CSW will sign off at this time, unless transfer does not take place.   UPDATE: RNCM contacted CSW to inform that wife is now stating that she does not want the patient to DC to the [place]. Patient's wife DID state she wanted the patient discharged to the [facility] earlier this morning. The wife has flipped back and forth between SNF and transfer to [place] in [place] made sure to clarify with the wife this morning that she was in fact requesting acute to acute transfer to [facility] and NOT transfer to a [place] SNF. Wife confirmed that she was wanting patient to go to [facility] for the remainder of his hospitalization if the MD thought this could be arranged.     Liz Beach MSW, [place], [place], JI:[id number]

## 2014-07-10 NOTE — Discharge Summary (Signed)
PATIENT DETAILS Name: Clayton [name] Age: 69 y.o. Sex: male Date of Birth: 08-13-1945 MRN: [id number]. Admitting Physician: Collene Gobble, MD [id number], PA-C  Admit Date: 06/28/2014 Discharge date: 07/10/2014  Recommendations for Outpatient Follow-up:  1. Please check CBC and Chemistries daily 2. Complete 2 weeks of Vancomycin from 2/21 3. Needs HD on M/W/F 4. Please have SLP/PT follow up at [place]:  Principal Problem:   Severe sepsis with septic shock Active Problems:   ESRD (end stage renal disease) on dialysis   Chronic systolic CHF (congestive heart failure), NYHA class 3   Coronary artery disease   S/P bilateral BKA (below knee amputation)   DM type 2, uncontrolled, with renal complications   DM (diabetes mellitus), type 2 with peripheral vascular complications   Peripheral vascular disease due to secondary diabetes mellitus   Cardiogenic shock   Malnutrition of moderate degree   C. difficile colitis   Palliative care encounter   Acute encephalopathy   Abdominal distension   Syncope and collapse   Acute on chronic diastolic CHF (congestive heart failure)   End stage renal disease on dialysis   Nonsustained ventricular tachycardia   Diabetes type 2, controlled      PAST MEDICAL HISTORY: Past Medical History  Diagnosis Date  . Renal disorder   . CHF (congestive heart failure)   . Diabetes mellitus without complication   . Coronary artery disease   . S/P bilateral BKA (below knee amputation)     DISCHARGE MEDICATIONS: Current Discharge Medication List    START taking these medications   Details  feeding supplement, RESOURCE BREEZE, (RESOURCE BREEZE) LIQD Take 1 Container by mouth 3 (three) times daily between meals. Refills: 0    hydrocortisone sodium succinate (SOLU-CORTEF) 100 MG SOLR injection Inject 0.5 mLs (25 mg total) into the vein every 12 (twelve) hours. Qty: 1 each    insulin aspart (NOVOLOG) 100 UNIT/ML  injection Inject 0-9 Units into the skin every 4 (four) hours. Qty: 10 mL, Refills: 11    midodrine (PROAMATINE) 10 MG tablet Take 1 tablet (10 mg total) by mouth 3 (three) times daily with meals.    vancomycin (VANCOCIN) 50 mg/mL oral solution Take 10 mLs (500 mg total) by mouth every 6 (six) hours. 2 weeks from 07/05/14      STOP taking these medications     cinacalcet (SENSIPAR) 30 MG tablet      mirtazapine (REMERON) 30 MG tablet      Omega-3 Fatty Acids (OMEGA-3 FISH OIL PO)      sertraline (ZOLOFT) 25 MG tablet      sevelamer (RENAGEL) 800 MG tablet         ALLERGIES:   Allergies  Allergen Reactions  . Simvastatin Other (See Comments)    unknown    BRIEF HPI:  See H&P, Labs, Consult and Test reports for all details in brief, patient is a 69 yo male with hx ESRD, CHF, DM, CAD, bilat BKA who receives all his care at [place]. He presented 2/14 after a fall at home with syncope. He has had diarrhea x 2-3 weeks with negative GI w/u thus far at [facility] (was scheduled for upper/lower scope next week). In ER was significantly hypotensive with SBP 60's, hypokalemic with K 2.4. No sig fluid resuscitation given r/t ESRD and CHF.  CONSULTATIONS:   pulmonary/intensive care and nephrology  PERTINENT RADIOLOGIC STUDIES: Dg Chest 2 View  06/28/2014   CLINICAL DATA:  Syncope  EXAM: CHEST  2 VIEW  COMPARISON:  03/25/2010  FINDINGS: There are intact appearances of the transvenous leads. There is unchanged moderate cardiomegaly. There is mild interstitial thickening and central vascular congestion which is new from 2011. This likely represents a degree of congestive heart failure. No confluent alveolar opacities are evident. No effusions are evident.  IMPRESSION: Probable mild congestive heart failure.   Electronically Signed   By: Andreas Newport M.D.   On: 06/28/2014 22:01   Dg Abd 1 View  07/02/2014   CLINICAL DATA:  Abdominal distension.  EXAM: ABDOMEN - 1 VIEW  COMPARISON:  None.   FINDINGS: Moderate distention of the stomach with error. No bowel distention is seen to suggest obstruction. Soft tissues are poorly defined. No convincing renal or ureteral stones. There are vascular calcifications along the aorta and its branch vessels.  IMPRESSION: 1. No evidence of bowel obstruction. Moderate distention of the stomach, nonspecific.   Electronically Signed   By: Lajean Manes M.D.   On: 07/02/2014 14:53   Ct Head Wo Contrast  06/28/2014   CLINICAL DATA:  Two episodes of syncope today  EXAM: CT HEAD WITHOUT CONTRAST  TECHNIQUE: Contiguous axial images were obtained from the base of the skull through the vertex without intravenous contrast.  COMPARISON:  None.  FINDINGS: The ventricles are normal in size and configuration. There is no intracranial mass, hemorrhage, extra-axial fluid collection, or midline shift. There is slight small vessel disease in the centra semiovale bilaterally. Elsewhere, gray-white compartments appear normal. No acute infarct apparent. The bony calvarium appears intact. The mastoid air cells are clear. There is a small air-fluid level in the right maxillary antrum with a a retention cyst along the anterior inferior right maxillary antrum. There is rightward deviation of the nasal septum.  IMPRESSION: Mild periventricular small vessel disease. No intracranial mass, hemorrhage, or acute appearing infarct. Evidence of right maxillary sinus disease.   Electronically Signed   By: Lowella Grip III M.D.   On: 06/28/2014 21:30   Dg Chest Port 1 View  07/07/2014   CLINICAL DATA:  Shortness of breath, history of CHF and end-stage renal disease, C difficile colitis.  EXAM: PORTABLE CHEST - 1 VIEW  COMPARISON:  Portable chest x-ray of June 30, 2014  FINDINGS: The lungs remain hypoinflated. The interstitial markings have improved. There is alveolar density just above the right hemidiaphragm. The left hemidiaphragm is largely obscured. The cardiac silhouette remains  enlarged. The permanent pacemaker defibrillator is in appropriate position radiographically. A left internal jugular venous catheter tip projects in the region of the junction of the right and left brachiocephalic veins.  IMPRESSION: Bilateral hypoinflation accentuates the lung markings. However, there are confluent densities at both lung bases consistent with atelectasis or pneumonia. Pulmonary interstitial edema has improved somewhat. The cardiac silhouette remains enlarged.   Electronically Signed   By: David  Martinique   On: 07/07/2014 07:46   Dg Chest Port 1 View  06/30/2014   CLINICAL DATA:  Left-sided hemodialysis catheter placement  EXAM: PORTABLE CHEST - 1 VIEW  COMPARISON:  06/29/2014  FINDINGS: New left IJ approach dual lumen dialysis catheter has been placed with tip over the mid SVC. No pneumothorax. Right IJ line has been removed. No significant change otherwise. Gaseous gastric distention is partly visualized.  IMPRESSION: Support apparatus as above.   Electronically Signed   By: Conchita Paris M.D.   On: 06/30/2014 14:25   Dg Chest Portable 1 View  06/29/2014   CLINICAL DATA:  Central line placement.  Subsequent evaluation.  EXAM: PORTABLE CHEST - 1 VIEW  COMPARISON:  Chest radiograph June 28, 2014  FINDINGS: The cardiac silhouette appears at least mild apparently enlarged coming with considerations low inspiratory examination. Cauda vascular markings without pleural effusion or focal consolidation. Calcified aortic knob. Strandy densities LEFT lung base.  Interval placement RIGHT internal jugular central venous catheter with distal tip projecting cavoatrial junction. No pneumothorax. Four lead LEFT cardiac defibrillator in situ. Soft tissue planes and included osseous structures are nonsuspicious. Moderate degenerative change of the thoracic spine.  IMPRESSION: RIGHT internal jugular central venous catheter distal tip projects cavoatrial junction. No pneumothorax.  Cardiomegaly.  LEFT lung  base atelectasis/scar.   Electronically Signed   By: Elon Alas   On: 06/29/2014 02:05     PERTINENT LAB RESULTS: CBC:  Recent Labs  07/08/14 0500 07/10/14 1028  WBC 3.9* 5.7  HGB 10.0* 10.8*  HCT 29.8* 30.9*  PLT 46* 71*   CMET CMP     Component Value Date/Time   NA 137 07/10/2014 1028   K 3.5 07/10/2014 1028   CL 105 07/10/2014 1028   CO2 22 07/10/2014 1028   GLUCOSE 122* 07/10/2014 1028   BUN 39* 07/10/2014 1028   CREATININE 4.91* 07/10/2014 1028   CALCIUM 8.1* 07/10/2014 1028   PROT 6.2 07/10/2014 1028   ALBUMIN 2.5* 07/10/2014 1028   AST 20 07/10/2014 1028   ALT 23 07/10/2014 1028   ALKPHOS 99 07/10/2014 1028   BILITOT 2.1* 07/10/2014 1028   GFRNONAA 11* 07/10/2014 1028   GFRAA 13* 07/10/2014 1028    GFR Estimated Creatinine Clearance: 16.3 mL/min (by C-G formula based on Cr of 4.91). No results for input(s): LIPASE, AMYLASE in the last 72 hours. No results for input(s): CKTOTAL, CKMB, CKMBINDEX, TROPONINI in the last 72 hours. Invalid input(s): POCBNP No results for input(s): DDIMER in the last 72 hours. No results for input(s): HGBA1C in the last 72 hours.  Recent Labs  07/10/14 1028  CHOL 115  HDL 26*  LDLCALC 71  TRIG 90  CHOLHDL 4.4   No results for input(s): TSH, T4TOTAL, T3FREE, THYROIDAB in the last 72 hours.  Invalid input(s): FREET3 No results for input(s): VITAMINB12, FOLATE, FERRITIN, TIBC, IRON, RETICCTPCT in the last 72 hours. Coags: No results for input(s): INR in the last 72 hours.  Invalid input(s): PT Microbiology: Recent Results (from the past 240 hour(s))  Clostridium Difficile by PCR     Status: Abnormal   Collection Time: 07/01/14  3:00 PM  Result Value Ref Range Status   C difficile by pcr POSITIVE (A) NEGATIVE Final    Comment: CRITICAL RESULT CALLED TO, READ BACK BY AND VERIFIED WITH: J. BISHOP RN 16:25 07/01/14 ([email or web address])      BRIEF HOSPITAL COURSE:  Severe sepsis with combined septic shock and cardiogenic  shock:secondary to C Diff Colitis. Resolved. BP now stable. Further taper Solucortef to 25 mg BID, continue Midodrine  Active Problems:  Syncope with collapse: Secondary to hypotension from volume depletion with diarrhea. 2-D echocardiogram demonstrated EF around 20%, seen by cardiology, no further workup recommended. Very poor overall prognosis.   C. difficile colitis: Continue oral Vanco, stop Flagyl. Discontinue flexiseal. Diarrhea improving by day of discharge.   Acute encephalopathy: Likely secondary to infection. Still lethargic and weak, but following commands. CT head negative on admission   Non-STEMI: Suspect mostly demand ischemia, seen by cardiology not a candidate for any further invasive testing.   Acute on chronic systolic heart failure: 2-D echocardiogram on  2/14 demonstrates EF around 20% with severely reduced systolic function and diffuse hypokinesis. Unable to use beta blocker/ACE inhibitor due to hypotension. Volume removal during dialysis. Currently seems to be clinically compensated.   Thrombocytopenia: Likely secondary to sepsis. Improving slowly. Follow CBC closely.   ESRD (end stage renal disease) on dialysis: Nephrology following, gets dialysis on Monday, Wednesday and Friday.   DM (diabetes mellitus), type 2 with peripheral vascular complications and renal complications: CBGs stable, continue SSI while inpatient.   History of peripheral vascular disease: S/P bilateral BKA (below knee amputation)   NSVT: Asymptomatic. Continue to monitor potassium and magnesium closely.   Malnutrition of moderate degree:continue supplements   TODAY-DAY OF DISCHARGE:  Subjective:   Slone Fazel today is still lethargic with significant gen weakness. Diarrhea improving.  Objective:   Blood pressure 106/65, pulse 106, temperature 98 F (36.7 C), temperature source Oral, resp. rate 16, height 6\' 5"  (1.956 m), weight 79.1 kg (174 lb 6.1 oz), SpO2 100  %.  Intake/Output Summary (Last 24 hours) at 07/10/14 1523 Last data filed at 07/10/14 1000  Gross per 24 hour  Intake    310 ml  Output     50 ml  Net    260 ml   Filed Weights   07/08/14 0803 07/08/14 1110 07/10/14 0750  Weight: 79.4 kg (175 lb 0.7 oz) 79.4 kg (175 lb 0.7 oz) 79.1 kg (174 lb 6.1 oz)    Exam Awake but very weak, and deconditioned. Chronically sick appearing. [place].AT,PERRAL Supple Neck,No JVD, No cervical lymphadenopathy appriciated.  Symmetrical Chest wall movement, Good air movement bilaterally, CTAB RRR,No Gallops,Rubs or new Murmurs, No Parasternal Heave +ve B.Sounds, Abd Soft, Non tender, No organomegaly appriciated, No rebound -guarding or rigidity. No Cyanosis, Clubbing or edema, No new Rash or bruise  DISCHARGE CONDITION: Stable  DISPOSITION: [facility]  DISCHARGE INSTRUCTIONS:    Activity:  As tolerated with Full fall precautions use walker/cane & assistance as needed  Diet recommendation: Full Liquid  Discharge Instructions    Diet - low sodium heart healthy    Complete by:  As directed      Diet Carb Modified    Complete by:  As directed      Increase activity slowly    Complete by:  As directed            Follow-up Information    Follow up with MILLER,GRANVILLE, PA-C. Schedule an appointment as soon as possible for a visit in 1 week.   Specialty:  Thoracic Diseases   Contact information:   [address] [place] [address] [phone]      Total Time spent on discharge equals 45 minutes.  SignedOren Binet 07/10/2014 3:23 PM

## 2014-07-10 NOTE — Progress Notes (Signed)
Pt. Being transferred to [facility] in [place], report call to [place], [place].  Awaiting for wife to sign consent to transfer, then will call care link for transport.  Alphonzo Lemmings, RN

## 2014-07-10 NOTE — Clinical Social Work Note (Signed)
CSW received call from patient's wife this morning. Wife states that she does not want patient to complete hospitalization here and wants him transferred to the [facility] as soon as possible. CSW has made RNCM aware.   Liz Beach MSW, [place], [place], [id number]

## 2014-07-10 NOTE — Progress Notes (Signed)
Patient transported to HD 

## 2014-07-10 NOTE — Progress Notes (Signed)
PATIENT DETAILS Name: Clayton [name] Age: 69 y.o. Sex: male Date of Birth: 05-Oct-1945 Admit Date: 06/28/2014 Admitting Physician Collene Gobble, MD [id number], PA-C  Subjective: No major events overnight.  Assessment/Plan: Principal Problem:   Severe sepsis with combined septic shock and cardiogenic shock:secondary to C Diff Colitis. Resolved. BP now stable. Further taper Solu-Medrol, continue Midodrine  Active Problems:   Syncope with collapse: Secondary to hypotension from volume depletion with diarrhea. 2-D echocardiogram demonstrated EF around 20%, seen by cardiology, no further workup recommended. Very poor overall prognosis.    C. difficile colitis: Continue oral Vanco, stop Flagyl. Discontinue flexiseal    Acute encephalopathy: Likely secondary to infection. Still lethargic, but following commands. CT head negative on admission    Non-STEMI: Suspect mostly demand ischemia, seen by cardiology not a candidate for any further invasive testing.    Acute on chronic systolic heart failure: 2-D echocardiogram on 2/14 demonstrates EF around 20% with severely reduced systolic function and diffuse hypokinesis. Unable to use beta blocker/ACE inhibitor due to hypotension. Volume removal during dialysis. Currently seems to be clinically compensated.    Thrombocytopenia: Likely secondary to sepsis. Improving slowly. Follow.    ESRD (end stage renal disease) on dialysis: Nephrology following, gets dialysis on Monday, Wednesday and Friday.    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 Check A1c    History of peripheral vascular disease: S/P bilateral BKA (below knee amputation)    NSVT: Asymptomatic. Continue to monitor potassium and magnesium closely.     Malnutrition of moderate degree:continue supplements  Disposition: Remain inpatient-SNF on discharge  Antibiotics:  See  below   Anti-infectives    Start     Dose/Rate Route Frequency Ordered Stop   07/09/14 0900  metroNIDAZOLE (FLAGYL) tablet 500 mg     500 mg Oral 3 times per day 07/09/14 0819 07/15/14 1359   07/05/14 1200  vancomycin (VANCOCIN) 50 mg/mL oral solution 500 mg     500 mg Oral 4 times per day 07/05/14 0804     07/04/14 1330  vancomycin (VANCOCIN) 500 mg in sodium chloride irrigation 0.9 % 100 mL ENEMA  Status:  Discontinued     500 mg Rectal 4 times per day 07/04/14 1218 07/07/14 1133   07/01/14 1800  vancomycin (VANCOCIN) 50 mg/mL oral solution 500 mg  Status:  Discontinued     500 mg Oral 4 times per day 07/01/14 1632 07/04/14 2017   07/01/14 1645  metroNIDAZOLE (FLAGYL) IVPB 500 mg  Status:  Discontinued     500 mg 100 mL/hr over 60 Minutes Intravenous Every 8 hours 07/01/14 1632 07/09/14 0818   07/01/14 1200  vancomycin (VANCOCIN) IVPB 1000 mg/200 mL premix  Status:  Discontinued     1,000 mg 200 mL/hr over 60 Minutes Intravenous Every M-W-F (Hemodialysis) 06/29/14 1803 06/30/14 1327   06/30/14 1800  vancomycin (VANCOCIN) IVPB 750 mg/150 ml premix  Status:  Discontinued     750 mg 150 mL/hr over 60 Minutes Intravenous Every 24 hours 06/30/14 1328 07/01/14 1023   06/30/14 1600  piperacillin-tazobactam (ZOSYN) IVPB 3.375 g  Status:  Discontinued     3.375 g 100 mL/hr over 30 Minutes Intravenous 4 times per day 06/30/14 1328 07/01/14 1023   06/29/14 1830  piperacillin-tazobactam (ZOSYN) IVPB 2.25 g  Status:  Discontinued     2.25 g 100 mL/hr over 30 Minutes Intravenous Every 8 hours 06/29/14 1803 06/30/14  1327   06/29/14 1815  vancomycin (VANCOCIN) 1,750 mg in sodium chloride 0.9 % 500 mL IVPB     1,750 mg 250 mL/hr over 120 Minutes Intravenous  Once 06/29/14 1803 06/29/14 2259      DVT Prophylaxis:  SCD's  Code Status: Full code   Family Communication None at bedside  Procedures:  None  CONSULTS:  cardiology and pulmonary/intensive care  Time spent 40 minutes-which  includes 50% of the time with face-to-face with patient/ family and coordinating care related to the above assessment and plan.  MEDICATIONS: Scheduled Meds: . antiseptic oral rinse  7 mL Mouth Rinse BID  . famotidine  20 mg Oral Daily  . feeding supplement (RESOURCE BREEZE)  1 Container Oral TID BM  . hydrocortisone sodium succinate  50 mg Intravenous Q12H  . insulin aspart  0-9 Units Subcutaneous 6 times per day  . metroNIDAZOLE  500 mg Oral 3 times per day  . midodrine  10 mg Oral BID WC  . vancomycin  500 mg Oral 4 times per day   Continuous Infusions: . dextrose 5 % and 0.9% NaCl 10 mL/hr at 07/08/14 1500   PRN Meds:.ondansetron (ZOFRAN) IV    PHYSICAL EXAM: Vital signs in last 24 hours: Filed Vitals:   07/09/14 2133 07/10/14 0541 07/10/14 0750 07/10/14 0857  BP: 119/95 130/64  108/64  Pulse: 103   106  Temp: 99.3 F (37.4 C) 98.3 F (36.8 C)  97.6 F (36.4 C)  TempSrc: Oral Oral  Oral  Resp: 20 20  18   Height:      Weight:   79.1 kg (174 lb 6.1 oz)   SpO2: 100%   96%    Weight change:  Filed Weights   07/08/14 0803 07/08/14 1110 07/10/14 0750  Weight: 79.4 kg (175 lb 0.7 oz) 79.4 kg (175 lb 0.7 oz) 79.1 kg (174 lb 6.1 oz)   Body mass index is 20.67 kg/(m^2).   Gen Exam: Awake and alert with clear but very slow speech.  Lethargic, Gen weakness Neck: Supple, No JVD.   Chest: B/L Clear.   CVS: S1 S2 Regular, no murmurs.  Abdomen: soft, BS +, non tender, non distended.  Extremities: B/L BKA Neurologic: Non Focal.   Skin: No Rash.   Wounds: N/A.   Intake/Output from previous day:  Intake/Output Summary (Last 24 hours) at 07/10/14 1143 Last data filed at 07/10/14 0500  Gross per 24 hour  Intake    210 ml  Output     50 ml  Net    160 ml     LAB RESULTS: CBC  Recent Labs Lab 07/05/14 0330 07/06/14 0400 07/07/14 0455 07/08/14 0500 07/10/14 1028  WBC 6.1 6.0 3.7* 3.9* 5.7  HGB 11.1* 11.0* 9.5* 10.0* 10.8*  HCT 34.0* 32.9* 28.6* 29.8* 30.9*   PLT 57* 69* 50* 46* PENDING  MCV 84.6 85.7 86.1 86.6 84.2  MCH 27.6 28.6 28.6 29.1 29.4  MCHC 32.6 33.4 33.2 33.6 35.0  RDW 22.2* 22.4* 22.3* 23.1* 24.2*  LYMPHSABS  --   --  0.3*  --  PENDING  MONOABS  --   --  0.3  --  PENDING  EOSABS  --   --  0.0  --  PENDING  BASOSABS  --   --  0.0  --  PENDING    Chemistries   Recent Labs Lab 07/05/14 0330 07/05/14 1600 07/06/14 0400 07/07/14 0455 07/08/14 0500 07/10/14 1028  NA 137 136 135 137 136 137  K  4.3 3.9 3.9 3.7 3.7 3.5  CL 101 103 101 106 105 105  CO2 28 26 25 26 25 22   GLUCOSE 135* 154* 159* 130* 121* 122*  BUN 9 10 12 23  35* 39*  CREATININE 2.25* 2.01* 2.09* 3.03* 4.03* 4.91*  CALCIUM 7.9* 7.7* 7.6* 7.7* 7.8* 8.1*  MG 2.4  --  2.3 2.4 2.7* 2.3    CBG:  Recent Labs Lab 07/09/14 1642 07/09/14 2004 07/10/14 0020 07/10/14 0424 07/10/14 0826  GLUCAP 198* 170* 145* 127* 121*    GFR Estimated Creatinine Clearance: 16.3 mL/min (by C-G formula based on Cr of 4.91).  Coagulation profile No results for input(s): INR, PROTIME in the last 168 hours.  Cardiac Enzymes No results for input(s): CKMB, TROPONINI, MYOGLOBIN in the last 168 hours.  Invalid input(s): CK  Invalid input(s): POCBNP No results for input(s): DDIMER in the last 72 hours. No results for input(s): HGBA1C in the last 72 hours.  Recent Labs  07/10/14 1028  CHOL 115  HDL 26*  LDLCALC 71  TRIG 90  CHOLHDL 4.4   No results for input(s): TSH, T4TOTAL, T3FREE, THYROIDAB in the last 72 hours.  Invalid input(s): FREET3 No results for input(s): VITAMINB12, FOLATE, FERRITIN, TIBC, IRON, RETICCTPCT in the last 72 hours. No results for input(s): LIPASE, AMYLASE in the last 72 hours.  Urine Studies No results for input(s): UHGB, CRYS in the last 72 hours.  Invalid input(s): UACOL, UAPR, USPG, UPH, UTP, UGL, UKET, UBIL, UNIT, UROB, ULEU, UEPI, UWBC, URBC, UBAC, CAST, UCOM, BILUA  MICROBIOLOGY: Recent Results (from the past 240 hour(s))   Clostridium Difficile by PCR     Status: Abnormal   Collection Time: 07/01/14  3:00 PM  Result Value Ref Range Status   C difficile by pcr POSITIVE (A) NEGATIVE Final    Comment: CRITICAL RESULT CALLED TO, READ BACK BY AND VERIFIED WITH: J. BISHOP RN 16:25 07/01/14 ([email or web address])     RADIOLOGY STUDIES/RESULTS: Dg Chest 2 View  06/28/2014   CLINICAL DATA:  Syncope  EXAM: CHEST  2 VIEW  COMPARISON:  03/25/2010  FINDINGS: There are intact appearances of the transvenous leads. There is unchanged moderate cardiomegaly. There is mild interstitial thickening and central vascular congestion which is new from 2011. This likely represents a degree of congestive heart failure. No confluent alveolar opacities are evident. No effusions are evident.  IMPRESSION: Probable mild congestive heart failure.   Electronically Signed   By: Andreas Newport M.D.   On: 06/28/2014 22:01   Dg Abd 1 View  07/02/2014   CLINICAL DATA:  Abdominal distension.  EXAM: ABDOMEN - 1 VIEW  COMPARISON:  None.  FINDINGS: Moderate distention of the stomach with error. No bowel distention is seen to suggest obstruction. Soft tissues are poorly defined. No convincing renal or ureteral stones. There are vascular calcifications along the aorta and its branch vessels.  IMPRESSION: 1. No evidence of bowel obstruction. Moderate distention of the stomach, nonspecific.   Electronically Signed   By: Lajean Manes M.D.   On: 07/02/2014 14:53   Ct Head Wo Contrast  06/28/2014   CLINICAL DATA:  Two episodes of syncope today  EXAM: CT HEAD WITHOUT CONTRAST  TECHNIQUE: Contiguous axial images were obtained from the base of the skull through the vertex without intravenous contrast.  COMPARISON:  None.  FINDINGS: The ventricles are normal in size and configuration. There is no intracranial mass, hemorrhage, extra-axial fluid collection, or midline shift. There is slight small vessel disease in the centra  semiovale bilaterally. Elsewhere, gray-white  compartments appear normal. No acute infarct apparent. The bony calvarium appears intact. The mastoid air cells are clear. There is a small air-fluid level in the right maxillary antrum with a a retention cyst along the anterior inferior right maxillary antrum. There is rightward deviation of the nasal septum.  IMPRESSION: Mild periventricular small vessel disease. No intracranial mass, hemorrhage, or acute appearing infarct. Evidence of right maxillary sinus disease.   Electronically Signed   By: Lowella Grip III M.D.   On: 06/28/2014 21:30   Dg Chest Port 1 View  07/07/2014   CLINICAL DATA:  Shortness of breath, history of CHF and end-stage renal disease, C difficile colitis.  EXAM: PORTABLE CHEST - 1 VIEW  COMPARISON:  Portable chest x-ray of June 30, 2014  FINDINGS: The lungs remain hypoinflated. The interstitial markings have improved. There is alveolar density just above the right hemidiaphragm. The left hemidiaphragm is largely obscured. The cardiac silhouette remains enlarged. The permanent pacemaker defibrillator is in appropriate position radiographically. A left internal jugular venous catheter tip projects in the region of the junction of the right and left brachiocephalic veins.  IMPRESSION: Bilateral hypoinflation accentuates the lung markings. However, there are confluent densities at both lung bases consistent with atelectasis or pneumonia. Pulmonary interstitial edema has improved somewhat. The cardiac silhouette remains enlarged.   Electronically Signed   By: David  Martinique   On: 07/07/2014 07:46   Dg Chest Port 1 View  06/30/2014   CLINICAL DATA:  Left-sided hemodialysis catheter placement  EXAM: PORTABLE CHEST - 1 VIEW  COMPARISON:  06/29/2014  FINDINGS: New left IJ approach dual lumen dialysis catheter has been placed with tip over the mid SVC. No pneumothorax. Right IJ line has been removed. No significant change otherwise. Gaseous gastric distention is partly visualized.   IMPRESSION: Support apparatus as above.   Electronically Signed   By: Conchita Paris M.D.   On: 06/30/2014 14:25   Dg Chest Portable 1 View  06/29/2014   CLINICAL DATA:  Central line placement.  Subsequent evaluation.  EXAM: PORTABLE CHEST - 1 VIEW  COMPARISON:  Chest radiograph June 28, 2014  FINDINGS: The cardiac silhouette appears at least mild apparently enlarged coming with considerations low inspiratory examination. Cauda vascular markings without pleural effusion or focal consolidation. Calcified aortic knob. Strandy densities LEFT lung base.  Interval placement RIGHT internal jugular central venous catheter with distal tip projecting cavoatrial junction. No pneumothorax. Four lead LEFT cardiac defibrillator in situ. Soft tissue planes and included osseous structures are nonsuspicious. Moderate degenerative change of the thoracic spine.  IMPRESSION: RIGHT internal jugular central venous catheter distal tip projects cavoatrial junction. No pneumothorax.  Cardiomegaly.  LEFT lung base atelectasis/scar.   Electronically Signed   By: Elon Alas   On: 06/29/2014 02:05    Oren Binet, MD  [facility] Pager:[phone]  If 7PM-7AM, please contact night-coverage [email or web address] Password [facility] 07/10/2014, 11:43 AM   LOS: 12 days

## 2014-07-11 DIAGNOSIS — D689 Coagulation defect, unspecified: Secondary | ICD-10-CM | POA: Diagnosis present

## 2014-07-11 DIAGNOSIS — D72819 Decreased white blood cell count, unspecified: Secondary | ICD-10-CM | POA: Diagnosis present

## 2014-07-11 DIAGNOSIS — M898X9 Other specified disorders of bone, unspecified site: Secondary | ICD-10-CM | POA: Diagnosis present

## 2014-07-11 DIAGNOSIS — I120 Hypertensive chronic kidney disease with stage 5 chronic kidney disease or end stage renal disease: Secondary | ICD-10-CM | POA: Diagnosis present

## 2014-07-11 DIAGNOSIS — I255 Ischemic cardiomyopathy: Secondary | ICD-10-CM | POA: Diagnosis present

## 2014-07-11 DIAGNOSIS — I272 Other secondary pulmonary hypertension: Secondary | ICD-10-CM | POA: Diagnosis present

## 2014-07-11 DIAGNOSIS — R6521 Severe sepsis with septic shock: Secondary | ICD-10-CM | POA: Diagnosis present

## 2014-07-11 DIAGNOSIS — Z89511 Acquired absence of right leg below knee: Secondary | ICD-10-CM | POA: Diagnosis not present

## 2014-07-11 DIAGNOSIS — E440 Moderate protein-calorie malnutrition: Secondary | ICD-10-CM | POA: Diagnosis present

## 2014-07-11 DIAGNOSIS — N186 End stage renal disease: Secondary | ICD-10-CM | POA: Diagnosis present

## 2014-07-11 DIAGNOSIS — E1159 Type 2 diabetes mellitus with other circulatory complications: Secondary | ICD-10-CM | POA: Diagnosis present

## 2014-07-11 DIAGNOSIS — I739 Peripheral vascular disease, unspecified: Secondary | ICD-10-CM | POA: Diagnosis present

## 2014-07-11 DIAGNOSIS — I5023 Acute on chronic systolic (congestive) heart failure: Secondary | ICD-10-CM | POA: Diagnosis not present

## 2014-07-11 DIAGNOSIS — Z6825 Body mass index (BMI) 25.0-25.9, adult: Secondary | ICD-10-CM | POA: Diagnosis not present

## 2014-07-11 DIAGNOSIS — I2510 Atherosclerotic heart disease of native coronary artery without angina pectoris: Secondary | ICD-10-CM | POA: Diagnosis present

## 2014-07-11 DIAGNOSIS — D631 Anemia in chronic kidney disease: Secondary | ICD-10-CM | POA: Diagnosis present

## 2014-07-11 DIAGNOSIS — A047 Enterocolitis due to Clostridium difficile: Secondary | ICD-10-CM | POA: Diagnosis present

## 2014-07-11 DIAGNOSIS — E861 Hypovolemia: Secondary | ICD-10-CM | POA: Diagnosis present

## 2014-07-11 DIAGNOSIS — E1165 Type 2 diabetes mellitus with hyperglycemia: Secondary | ICD-10-CM | POA: Diagnosis present

## 2014-07-11 DIAGNOSIS — Z95810 Presence of automatic (implantable) cardiac defibrillator: Secondary | ICD-10-CM | POA: Diagnosis not present

## 2014-07-11 DIAGNOSIS — Z79899 Other long term (current) drug therapy: Secondary | ICD-10-CM | POA: Diagnosis not present

## 2014-07-11 DIAGNOSIS — I959 Hypotension, unspecified: Secondary | ICD-10-CM | POA: Diagnosis present

## 2014-07-11 DIAGNOSIS — G9340 Encephalopathy, unspecified: Secondary | ICD-10-CM | POA: Diagnosis present

## 2014-07-11 DIAGNOSIS — R570 Cardiogenic shock: Secondary | ICD-10-CM | POA: Diagnosis present

## 2014-07-11 DIAGNOSIS — E1122 Type 2 diabetes mellitus with diabetic chronic kidney disease: Secondary | ICD-10-CM | POA: Diagnosis present

## 2014-07-11 DIAGNOSIS — A419 Sepsis, unspecified organism: Secondary | ICD-10-CM | POA: Diagnosis present

## 2014-07-11 DIAGNOSIS — I472 Ventricular tachycardia: Secondary | ICD-10-CM | POA: Diagnosis not present

## 2014-07-11 DIAGNOSIS — E876 Hypokalemia: Secondary | ICD-10-CM | POA: Diagnosis present

## 2014-07-11 DIAGNOSIS — Z89512 Acquired absence of left leg below knee: Secondary | ICD-10-CM | POA: Diagnosis not present

## 2014-07-11 DIAGNOSIS — I214 Non-ST elevation (NSTEMI) myocardial infarction: Secondary | ICD-10-CM | POA: Diagnosis present

## 2014-07-11 LAB — HEMOGLOBIN A1C
HEMOGLOBIN A1C: 5.7 % — AB (ref 4.8–5.6)
Mean Plasma Glucose: 117 mg/dL

## 2018-10-06 ENCOUNTER — Encounter (HOSPITAL_COMMUNITY): Payer: Self-pay

## 2018-10-06 ENCOUNTER — Emergency Department (HOSPITAL_COMMUNITY): Payer: Medicare Other

## 2018-10-06 ENCOUNTER — Other Ambulatory Visit: Payer: Self-pay

## 2018-10-06 ENCOUNTER — Inpatient Hospital Stay (HOSPITAL_COMMUNITY)
Admission: EM | Admit: 2018-10-06 | Discharge: 2018-10-13 | DRG: 353 | Disposition: A | Discharge status: Home Health | Payer: Medicare Other | Attending: Internal Medicine | Admitting: Internal Medicine

## 2018-10-06 ENCOUNTER — Inpatient Hospital Stay (HOSPITAL_COMMUNITY): Payer: Medicare Other | Admitting: Anesthesiology

## 2018-10-06 ENCOUNTER — Encounter (HOSPITAL_COMMUNITY)
Admission: EM | Disposition: A | Discharge status: Home Health | Payer: Self-pay | Source: Home / Self Care | Attending: Internal Medicine

## 2018-10-06 DIAGNOSIS — R791 Abnormal coagulation profile: Secondary | ICD-10-CM | POA: Diagnosis not present

## 2018-10-06 DIAGNOSIS — R079 Chest pain, unspecified: Secondary | ICD-10-CM

## 2018-10-06 DIAGNOSIS — I9589 Other hypotension: Secondary | ICD-10-CM | POA: Diagnosis not present

## 2018-10-06 DIAGNOSIS — Z79899 Other long term (current) drug therapy: Secondary | ICD-10-CM | POA: Diagnosis not present

## 2018-10-06 DIAGNOSIS — E875 Hyperkalemia: Secondary | ICD-10-CM | POA: Diagnosis not present

## 2018-10-06 DIAGNOSIS — I2510 Atherosclerotic heart disease of native coronary artery without angina pectoris: Secondary | ICD-10-CM | POA: Diagnosis present

## 2018-10-06 DIAGNOSIS — K7689 Other specified diseases of liver: Secondary | ICD-10-CM | POA: Diagnosis present

## 2018-10-06 DIAGNOSIS — K660 Peritoneal adhesions (postprocedural) (postinfection): Secondary | ICD-10-CM | POA: Diagnosis present

## 2018-10-06 DIAGNOSIS — R188 Other ascites: Secondary | ICD-10-CM | POA: Diagnosis present

## 2018-10-06 DIAGNOSIS — I5022 Chronic systolic (congestive) heart failure: Secondary | ICD-10-CM | POA: Diagnosis present

## 2018-10-06 DIAGNOSIS — Z992 Dependence on renal dialysis: Secondary | ICD-10-CM

## 2018-10-06 DIAGNOSIS — I132 Hypertensive heart and chronic kidney disease with heart failure and with stage 5 chronic kidney disease, or end stage renal disease: Secondary | ICD-10-CM | POA: Diagnosis present

## 2018-10-06 DIAGNOSIS — D696 Thrombocytopenia, unspecified: Secondary | ICD-10-CM | POA: Diagnosis not present

## 2018-10-06 DIAGNOSIS — Z1159 Encounter for screening for other viral diseases: Secondary | ICD-10-CM | POA: Diagnosis not present

## 2018-10-06 DIAGNOSIS — K56609 Unspecified intestinal obstruction, unspecified as to partial versus complete obstruction: Secondary | ICD-10-CM | POA: Diagnosis not present

## 2018-10-06 DIAGNOSIS — N186 End stage renal disease: Secondary | ICD-10-CM | POA: Diagnosis present

## 2018-10-06 DIAGNOSIS — K420 Umbilical hernia with obstruction, without gangrene: Secondary | ICD-10-CM | POA: Diagnosis present

## 2018-10-06 DIAGNOSIS — Z95810 Presence of automatic (implantable) cardiac defibrillator: Secondary | ICD-10-CM | POA: Diagnosis not present

## 2018-10-06 DIAGNOSIS — D631 Anemia in chronic kidney disease: Secondary | ICD-10-CM | POA: Diagnosis present

## 2018-10-06 DIAGNOSIS — Z89512 Acquired absence of left leg below knee: Secondary | ICD-10-CM | POA: Diagnosis not present

## 2018-10-06 DIAGNOSIS — E1122 Type 2 diabetes mellitus with diabetic chronic kidney disease: Secondary | ICD-10-CM | POA: Diagnosis present

## 2018-10-06 DIAGNOSIS — Z89511 Acquired absence of right leg below knee: Secondary | ICD-10-CM | POA: Diagnosis not present

## 2018-10-06 DIAGNOSIS — Z888 Allergy status to other drugs, medicaments and biological substances status: Secondary | ICD-10-CM

## 2018-10-06 DIAGNOSIS — I472 Ventricular tachycardia: Secondary | ICD-10-CM | POA: Diagnosis not present

## 2018-10-06 DIAGNOSIS — L8990 Pressure ulcer of unspecified site, unspecified stage: Secondary | ICD-10-CM | POA: Diagnosis present

## 2018-10-06 DIAGNOSIS — Z794 Long term (current) use of insulin: Secondary | ICD-10-CM | POA: Diagnosis not present

## 2018-10-06 HISTORY — PX: INCISIONAL HERNIA REPAIR: SHX193

## 2018-10-06 LAB — CBC
HCT: 41.7 % (ref 39.0–52.0)
Hemoglobin: 13.2 g/dL (ref 13.0–17.0)
MCH: 31.3 pg (ref 26.0–34.0)
MCHC: 31.7 g/dL (ref 30.0–36.0)
MCV: 98.8 fL (ref 80.0–100.0)
Platelets: 129 10*3/uL — ABNORMAL LOW (ref 150–400)
RBC: 4.22 MIL/uL (ref 4.22–5.81)
RDW: 15.6 % — ABNORMAL HIGH (ref 11.5–15.5)
WBC: 6.6 10*3/uL (ref 4.0–10.5)
nRBC: 0 % (ref 0.0–0.2)

## 2018-10-06 LAB — TROPONIN I: Troponin I: 0.08 ng/mL (ref <0.03)

## 2018-10-06 LAB — COMPREHENSIVE METABOLIC PANEL
ALT: 11 U/L (ref 0–44)
AST: 13 U/L — ABNORMAL LOW (ref 15–41)
Albumin: 3.8 g/dL (ref 3.5–5.0)
Alkaline Phosphatase: 147 U/L — ABNORMAL HIGH (ref 38–126)
Anion gap: 20 — ABNORMAL HIGH (ref 5–15)
BUN: 29 mg/dL — ABNORMAL HIGH (ref 8–23)
CO2: 32 mmol/L (ref 22–32)
Calcium: 11.1 mg/dL — ABNORMAL HIGH (ref 8.9–10.3)
Chloride: 88 mmol/L — ABNORMAL LOW (ref 98–111)
Creatinine, Ser: 5.51 mg/dL — ABNORMAL HIGH (ref 0.61–1.24)
GFR calc Af Amer: 11 mL/min — ABNORMAL LOW (ref >60)
GFR calc non Af Amer: 10 mL/min — ABNORMAL LOW (ref >60)
Glucose, Bld: 120 mg/dL — ABNORMAL HIGH (ref 70–99)
Potassium: 4.9 mmol/L (ref 3.5–5.1)
Sodium: 140 mmol/L (ref 135–145)
Total Bilirubin: 1.4 mg/dL — ABNORMAL HIGH (ref 0.3–1.2)
Total Protein: 9.1 g/dL — ABNORMAL HIGH (ref 6.5–8.1)

## 2018-10-06 LAB — POCT I-STAT 4, (NA,K, GLUC, HGB,HCT)
Glucose, Bld: 133 mg/dL — ABNORMAL HIGH (ref 70–99)
HCT: 36 % — ABNORMAL LOW (ref 39.0–52.0)
Hemoglobin: 12.2 g/dL — ABNORMAL LOW (ref 13.0–17.0)
Potassium: 5 mmol/L (ref 3.5–5.1)
Sodium: 136 mmol/L (ref 135–145)

## 2018-10-06 LAB — LIPASE, BLOOD: Lipase: 30 U/L (ref 11–51)

## 2018-10-06 LAB — SARS CORONAVIRUS 2 BY RT PCR (HOSPITAL ORDER, PERFORMED IN ~~LOC~~ HOSPITAL LAB): SARS Coronavirus 2: NEGATIVE

## 2018-10-06 LAB — LACTIC ACID, PLASMA: Lactic Acid, Venous: 2.3 mmol/L (ref 0.5–1.9)

## 2018-10-06 LAB — GLUCOSE, CAPILLARY: Glucose-Capillary: 122 mg/dL — ABNORMAL HIGH (ref 70–99)

## 2018-10-06 SURGERY — REPAIR, HERNIA, INCISIONAL
Anesthesia: General | Site: Abdomen

## 2018-10-06 MED ORDER — OXYCODONE HCL 5 MG PO TABS: 5.0000 mg | ORAL_TABLET | ORAL | Status: DC | PRN

## 2018-10-06 MED ORDER — IOHEXOL 300 MG/ML  SOLN
100.0000 mL | Freq: Once | INTRAMUSCULAR | Status: AC | PRN
  Administered 2018-10-06: 100 mL via INTRAVENOUS

## 2018-10-06 MED ORDER — MIDAZOLAM HCL 2 MG/2ML IJ SOLN
INTRAMUSCULAR | Status: AC
  Filled 2018-10-06: qty 2

## 2018-10-06 MED ORDER — SUCCINYLCHOLINE CHLORIDE 200 MG/10ML IV SOSY
PREFILLED_SYRINGE | INTRAVENOUS | Status: DC | PRN
  Administered 2018-10-06: 80 mg via INTRAVENOUS

## 2018-10-06 MED ORDER — ONDANSETRON HCL 4 MG/2ML IJ SOLN
4.0000 mg | Freq: Once | INTRAMUSCULAR | Status: AC
  Administered 2018-10-06: 14:00:00 4 mg via INTRAVENOUS
  Filled 2018-10-06: qty 2

## 2018-10-06 MED ORDER — OXYCODONE HCL 5 MG/5ML PO SOLN: 5.0000 mg | Freq: Once | ORAL | Status: DC | PRN

## 2018-10-06 MED ORDER — HYDROMORPHONE HCL 1 MG/ML IJ SOLN: 1.0000 mg | INTRAMUSCULAR | Status: DC | PRN

## 2018-10-06 MED ORDER — LIDOCAINE 2% (20 MG/ML) 5 ML SYRINGE
INTRAMUSCULAR | Status: DC | PRN
  Administered 2018-10-06: 60 mg via INTRAVENOUS

## 2018-10-06 MED ORDER — DIPHENHYDRAMINE HCL 50 MG/ML IJ SOLN: 12.5000 mg | Freq: Four times a day (QID) | INTRAMUSCULAR | Status: DC | PRN

## 2018-10-06 MED ORDER — CISATRACURIUM BESYLATE (PF) 10 MG/5ML IV SOLN
INTRAVENOUS | Status: DC | PRN
  Administered 2018-10-06: 12 mg via INTRAVENOUS
  Administered 2018-10-06: 2 mg via INTRAVENOUS

## 2018-10-06 MED ORDER — ACETAMINOPHEN 500 MG PO TABS: 1000.0000 mg | ORAL_TABLET | Freq: Once | ORAL | Status: DC | PRN

## 2018-10-06 MED ORDER — ONDANSETRON HCL 4 MG/2ML IJ SOLN: 4.0000 mg | Freq: Four times a day (QID) | INTRAMUSCULAR | Status: DC | PRN

## 2018-10-06 MED ORDER — SODIUM CHLORIDE 0.9 % IV SOLN
Freq: Once | INTRAVENOUS | Status: AC
  Administered 2018-10-06: 21:00:00 via INTRAVENOUS

## 2018-10-06 MED ORDER — OXYCODONE HCL 5 MG PO TABS: 5.0000 mg | ORAL_TABLET | Freq: Once | ORAL | Status: DC | PRN

## 2018-10-06 MED ORDER — ENOXAPARIN SODIUM 30 MG/0.3ML ~~LOC~~ SOLN
30.0000 mg | SUBCUTANEOUS | Status: DC
  Administered 2018-10-07 – 2018-10-13 (×7): 30 mg via SUBCUTANEOUS
  Filled 2018-10-06 (×7): qty 0.3

## 2018-10-06 MED ORDER — GLYCOPYRROLATE 0.2 MG/ML IJ SOLN
INTRAMUSCULAR | Status: DC | PRN
  Administered 2018-10-06: 0.4 mg via INTRAVENOUS

## 2018-10-06 MED ORDER — ETOMIDATE 2 MG/ML IV SOLN
INTRAVENOUS | Status: DC | PRN
  Administered 2018-10-06: 20 mg via INTRAVENOUS

## 2018-10-06 MED ORDER — 0.9 % SODIUM CHLORIDE (POUR BTL) OPTIME
TOPICAL | Status: DC | PRN
  Administered 2018-10-06: 1000 mL

## 2018-10-06 MED ORDER — ACETAMINOPHEN 160 MG/5ML PO SOLN: 1000.0000 mg | Freq: Once | ORAL | Status: DC | PRN

## 2018-10-06 MED ORDER — HYDROMORPHONE HCL 1 MG/ML IJ SOLN
0.5000 mg | Freq: Once | INTRAMUSCULAR | Status: AC
  Administered 2018-10-06: 0.5 mg via INTRAVENOUS
  Filled 2018-10-06: qty 1

## 2018-10-06 MED ORDER — NEOSTIGMINE METHYLSULFATE 10 MG/10ML IV SOLN
INTRAVENOUS | Status: DC | PRN
  Administered 2018-10-06: 3 mg via INTRAVENOUS

## 2018-10-06 MED ORDER — BUPIVACAINE-EPINEPHRINE (PF) 0.25% -1:200000 IJ SOLN
INTRAMUSCULAR | Status: AC
  Filled 2018-10-06: qty 30

## 2018-10-06 MED ORDER — SODIUM CHLORIDE 0.9 % IV SOLN
2.0000 g | INTRAVENOUS | Status: AC
  Administered 2018-10-06: 19:00:00 2 g via INTRAVENOUS
  Filled 2018-10-06 (×2): qty 2

## 2018-10-06 MED ORDER — CISATRACURIUM BESYLATE 20 MG/10ML IV SOLN
INTRAVENOUS | Status: AC
  Filled 2018-10-06: qty 10

## 2018-10-06 MED ORDER — BUPIVACAINE-EPINEPHRINE (PF) 0.25% -1:200000 IJ SOLN
INTRAMUSCULAR | Status: DC | PRN
  Administered 2018-10-06: 20 mL via PERINEURAL

## 2018-10-06 MED ORDER — DIPHENHYDRAMINE HCL 12.5 MG/5ML PO ELIX: 12.5000 mg | ORAL_SOLUTION | Freq: Four times a day (QID) | ORAL | Status: DC | PRN

## 2018-10-06 MED ORDER — DEXAMETHASONE SODIUM PHOSPHATE 4 MG/ML IJ SOLN
INTRAMUSCULAR | Status: DC | PRN
  Administered 2018-10-06: 4 mg via INTRAVENOUS

## 2018-10-06 MED ORDER — ETOMIDATE 2 MG/ML IV SOLN: 10.0000 mg | Freq: Once | INTRAVENOUS | Status: DC

## 2018-10-06 MED ORDER — ONDANSETRON HCL 4 MG/2ML IJ SOLN
INTRAMUSCULAR | Status: DC | PRN
  Administered 2018-10-06: 4 mg via INTRAVENOUS

## 2018-10-06 MED ORDER — ONDANSETRON HCL 4 MG/2ML IJ SOLN
INTRAMUSCULAR | Status: AC
  Filled 2018-10-06: qty 2

## 2018-10-06 MED ORDER — SODIUM CHLORIDE 0.9 % IV BOLUS
500.0000 mL | Freq: Once | INTRAVENOUS | Status: AC
  Administered 2018-10-06: 500 mL via INTRAVENOUS

## 2018-10-06 MED ORDER — FENTANYL CITRATE (PF) 100 MCG/2ML IJ SOLN
INTRAMUSCULAR | Status: DC | PRN
  Administered 2018-10-06 (×2): 25 ug via INTRAVENOUS

## 2018-10-06 MED ORDER — FENTANYL CITRATE (PF) 250 MCG/5ML IJ SOLN
INTRAMUSCULAR | Status: AC
  Filled 2018-10-06: qty 5

## 2018-10-06 MED ORDER — HEPARIN SODIUM (PORCINE) 5000 UNIT/ML IJ SOLN: 5000.0000 [IU] | Freq: Three times a day (TID) | INTRAMUSCULAR | Status: DC

## 2018-10-06 MED ORDER — FENTANYL CITRATE (PF) 100 MCG/2ML IJ SOLN: 25.0000 ug | INTRAMUSCULAR | Status: DC | PRN

## 2018-10-06 MED ORDER — SODIUM CHLORIDE 0.9 % IV SOLN
INTRAVENOUS | Status: DC | PRN
  Administered 2018-10-06: 19:00:00 50 ug/min via INTRAVENOUS

## 2018-10-06 MED ORDER — SODIUM CHLORIDE 0.9 % IV SOLN
INTRAVENOUS | Status: DC | PRN
  Administered 2018-10-06: 19:00:00 via INTRAVENOUS

## 2018-10-06 MED ORDER — ONDANSETRON 4 MG PO TBDP: 4.0000 mg | ORAL_TABLET | Freq: Four times a day (QID) | ORAL | Status: DC | PRN

## 2018-10-06 MED ORDER — VASOPRESSIN 20 UNIT/ML IV SOLN
INTRAVENOUS | Status: AC
  Filled 2018-10-06: qty 1

## 2018-10-06 MED ORDER — ACETAMINOPHEN 10 MG/ML IV SOLN: 1000.0000 mg | Freq: Once | INTRAVENOUS | Status: DC | PRN

## 2018-10-06 MED ORDER — MIDODRINE HCL 5 MG PO TABS
10.0000 mg | ORAL_TABLET | Freq: Three times a day (TID) | ORAL | Status: DC
  Administered 2018-10-07 – 2018-10-13 (×20): 10 mg via ORAL
  Filled 2018-10-06 (×20): qty 2

## 2018-10-06 SURGICAL SUPPLY — 42 items
BENZOIN TINCTURE PRP APPL 2/3 (GAUZE/BANDAGES/DRESSINGS) ×3 IMPLANT
BLADE CLIPPER SURG (BLADE) IMPLANT
CANISTER SUCT 3000ML PPV (MISCELLANEOUS) ×3 IMPLANT
CHLORAPREP W/TINT 26ML (MISCELLANEOUS) ×3 IMPLANT
CLOSURE WOUND 1/2 X4 (GAUZE/BANDAGES/DRESSINGS) ×1
COVER SURGICAL LIGHT HANDLE (MISCELLANEOUS) ×3 IMPLANT
COVER WAND RF STERILE (DRAPES) IMPLANT
DRAPE LAPAROSCOPIC ABDOMINAL (DRAPES) ×3 IMPLANT
DRSG TEGADERM 4X4.75 (GAUZE/BANDAGES/DRESSINGS) ×3 IMPLANT
ELECT CAUTERY BLADE 6.4 (BLADE) ×3 IMPLANT
ELECT REM PT RETURN 9FT ADLT (ELECTROSURGICAL) ×3
ELECT SOLID GEL RDN PRO-PADZ (MISCELLANEOUS) ×3
ELECTRODE REM PT RTRN 9FT ADLT (ELECTROSURGICAL) ×1 IMPLANT
ELECTRODE SOLI GEL RDN PROPADZ (MISCELLANEOUS) ×1 IMPLANT
GAUZE SPONGE 2X2 8PLY STRL LF (GAUZE/BANDAGES/DRESSINGS) ×1 IMPLANT
GLOVE BIO SURGEON STRL SZ7 (GLOVE) ×3 IMPLANT
GLOVE BIOGEL PI IND STRL 7.0 (GLOVE) ×2 IMPLANT
GLOVE BIOGEL PI INDICATOR 7.0 (GLOVE) ×4
GLOVE SURG SS PI 7.0 STRL IVOR (GLOVE) ×3 IMPLANT
GOWN STRL REUS W/ TWL LRG LVL3 (GOWN DISPOSABLE) ×2 IMPLANT
GOWN STRL REUS W/TWL LRG LVL3 (GOWN DISPOSABLE) ×4
HANDLE SUCTION POOLE (INSTRUMENTS) ×1 IMPLANT
KIT BASIN OR (CUSTOM PROCEDURE TRAY) ×3 IMPLANT
KIT TURNOVER KIT B (KITS) ×3 IMPLANT
MESH PHASIX RESORB ROUND 7.6CM (Mesh General) ×3 IMPLANT
NEEDLE 22X1 1/2 (OR ONLY) (NEEDLE) ×3 IMPLANT
NS IRRIG 1000ML POUR BTL (IV SOLUTION) ×3 IMPLANT
PACK GENERAL/GYN (CUSTOM PROCEDURE TRAY) ×3 IMPLANT
PAD ARMBOARD 7.5X6 YLW CONV (MISCELLANEOUS) ×6 IMPLANT
PENCIL SMOKE EVACUATOR (MISCELLANEOUS) ×3 IMPLANT
SPONGE GAUZE 2X2 STER 10/PKG (GAUZE/BANDAGES/DRESSINGS) ×2
STRIP CLOSURE SKIN 1/2X4 (GAUZE/BANDAGES/DRESSINGS) ×2 IMPLANT
SUCTION POOLE HANDLE (INSTRUMENTS) ×3
SUT MNCRL AB 4-0 PS2 18 (SUTURE) ×6 IMPLANT
SUT NOVA NAB GS-21 0 18 T12 DT (SUTURE) ×3 IMPLANT
SUT PDS AB 0 CT 36 (SUTURE) ×12 IMPLANT
SUT PROLENE 0 CT 1 CR/8 (SUTURE) ×3 IMPLANT
SUT VIC AB 3-0 SH 18 (SUTURE) ×6 IMPLANT
TOWEL OR 17X24 6PK STRL BLUE (TOWEL DISPOSABLE) ×3 IMPLANT
TOWEL OR 17X26 10 PK STRL BLUE (TOWEL DISPOSABLE) ×3 IMPLANT
TUBE CONNECTING 12'X1/4 (SUCTIONS) ×1
TUBE CONNECTING 12X1/4 (SUCTIONS) ×2 IMPLANT

## 2018-10-06 NOTE — Transfer of Care (Signed)
Immediate Anesthesia Transfer of Care Note  Patient: Clayton [name]  Procedure(s) Performed: HERNIA REPAIR INCISIONAL (N/A Abdomen)  Patient Location: PACU  Anesthesia Type:General  Level of Consciousness: drowsy and responds to stimulation  Airway & Oxygen Therapy: Patient Spontanous Breathing and Patient connected to nasal cannula oxygen  Post-op Assessment: Report given to RN and Post -op Vital signs reviewed and stable  Post vital signs: Reviewed and stable  Last Vitals:  Vitals Value Taken Time  BP 83/56 10/06/2018  8:47 PM  Temp    Pulse 70 10/06/2018  8:53 PM  Resp 19 10/06/2018  8:53 PM  SpO2 93 % 10/06/2018  8:53 PM  Vitals shown include unvalidated device data.  Last Pain:  Vitals:   10/06/18 1434  TempSrc:   PainSc: 7          Complications: No apparent anesthesia complications

## 2018-10-06 NOTE — ED Notes (Signed)
Patient transported to CT 

## 2018-10-06 NOTE — Anesthesia Preprocedure Evaluation (Addendum)
Anesthesia Evaluation  Patient identified by MRN, date of birth, ID band Patient awake    Reviewed: Allergy & Precautions, NPO status , Patient's Chart, lab work & pertinent test results  Airway Mallampati: I  TM Distance: >3 FB Neck ROM: Full    Dental  (+) Dental Advisory Given, Teeth Intact   Pulmonary neg pulmonary ROS,    breath sounds clear to auscultation       Cardiovascular + CAD, + Peripheral Vascular Disease and +CHF  + pacemaker + Cardiac Defibrillator  Rhythm:Regular     Neuro/Psych negative neurological ROS  negative psych ROS   GI/Hepatic negative GI ROS, Neg liver ROS,   Endo/Other  diabetes  Renal/GU ESRF and DialysisRenal disease     Musculoskeletal negative musculoskeletal ROS (+)   Abdominal   Peds  Hematology negative hematology ROS (+)   Anesthesia Other Findings B/l bka, ef< 20% via osh records  Reproductive/Obstetrics                            Anesthesia Physical Anesthesia Plan  ASA: IV  Anesthesia Plan: General   Post-op Pain Management:    Induction: Intravenous and Rapid sequence  PONV Risk Score and Plan: 2 and Ondansetron and Dexamethasone  Airway Management Planned: Oral ETT  Additional Equipment: Arterial line  Intra-op Plan:   Post-operative Plan: Extubation in OR and Possible Post-op intubation/ventilation  Informed Consent: I have reviewed the patients History and Physical, chart, labs and discussed the procedure including the risks, benefits and alternatives for the proposed anesthesia with the patient or authorized representative who has indicated his/her understanding and acceptance.     Dental advisory given  Plan Discussed with: CRNA and Surgeon  Anesthesia Plan Comments:         Anesthesia Quick Evaluation

## 2018-10-06 NOTE — H&P (Signed)
History and Physical    Clayton [name] Clayton [name]:[id number] DOB: [birth year] DOA: 10/06/2018  PCP: [name], PA-C (Inactive) Patient coming from: Home  I have personally briefly reviewed patient's old medical records in [facility]  Chief Complaint: abd pain since 2 days.   HPI: Clayton [name] is a 73 y.o. 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  Patient denies any fever or chills denies headache shortness of breath chest pain back pain, dysuria.  He reports persistent nausea and multiple episodes of nonbloody nonbilious vomiting.  Abdominal pain is mostly around the umbilicus nonradiating and moderate to severe and constant.   ED Course: on arrival to ED. patient was afebrile blood pressure 90s over 60s, heart rate 70s per minute saturating 98% on 2 L of oxygen.  Labs revealed lactic acid of 2.3, creatinine of 5.5, calcium of 11.1, anion gap of 20, alk phos of 147, WBC count of 6.6.  [Coronavirus was negative CT abdomen pelvis with contrast shows The stomach is grossly fluid distended. The proximal small bowel is mildly distended, largest loops measuring 3.9 cm. There is a narrow necked umbilical hernia containing a single loop of mid small bowel and fluid, with decompression of the distal small bowel and colon. Findings are concerning for obstruction at the level of an incarcerated hernia.  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   Surgery consulted and patient was referred to medical service for admission   Review of Systems: As per HPI otherwise 10 point review of systems negative.    Past Medical History:  Diagnosis Date  . CHF (congestive heart failure) ([place])   . Coronary artery disease   . Diabetes mellitus  without complication ([place])   . Renal disorder   . S/P bilateral BKA (below knee amputation) [facility])     Past Surgical History:  Procedure Laterality Date  . BELOW KNEE LEG AMPUTATION Bilateral   . CARDIAC CATHETERIZATION  1998/1999   stents both times, no other details available  . CARDIAC DEFIBRILLATOR PLACEMENT  2010   [employer]     reports that he has never smoked. He does not have any smokeless tobacco history on file. He reports that he does not drink alcohol or use drugs.  Allergies  Allergen Reactions  . Simvastatin Other (See Comments)    unknown    : Family history reviewed and not pertinent  Prior to Admission medications   Medication Sig Start Date End Date Taking? Authorizing Provider  feeding supplement, RESOURCE BREEZE, (RESOURCE BREEZE) LIQD Take 1 Container by mouth 3 (three) times daily between meals. 07/10/14   Ghimire, Henreitta Leber, MD  hydrocortisone sodium succinate (SOLU-CORTEF) 100 MG SOLR injection Inject 0.5 mLs (25 mg total) into the vein every 12 (twelve) hours. 07/10/14   Ghimire, Henreitta Leber, MD  insulin aspart (NOVOLOG) 100 UNIT/ML injection Inject 0-9 Units into the skin every 4 (four) hours. 07/10/14   Ghimire, Henreitta Leber, MD  midodrine (PROAMATINE) 10 MG tablet Take 1 tablet (10 mg total) by mouth 3 (three) times daily with meals. 07/10/14   Ghimire, Henreitta Leber, MD  vancomycin (VANCOCIN) 50 mg/mL oral solution Take 10 mLs (500 mg total) by mouth every 6 (six) hours. 2 weeks from 07/05/14 07/10/14   Jonetta Osgood, MD    Physical Exam: Vitals:  10/06/18 1315 10/06/18 1415 10/06/18 1430 10/06/18 1445  BP: (!) 94/59 (!) 88/54 (!) 90/59 92/65  Pulse:  69 68 70  Resp:   20   Temp:      TempSrc:      SpO2:  100% 98% 99%  Weight:      Height:        Constitutional: NAD, calm, comfortable Vitals:   10/06/18 1315 10/06/18 1415 10/06/18 1430 10/06/18 1445  BP: (!) 94/59 (!) 88/54 (!) 90/59 92/65  Pulse:  69 68 70  Resp:   20   Temp:      TempSrc:       SpO2:  100% 98% 99%  Weight:      Height:       Eyes: PERRL, lids and conjunctivae normal ENMT: Mucous membranes are moist. Posterior pharynx clear of any exudate or lesions.Normal dentition.  Neck: normal, supple, no masses, no thyromegaly Respiratory: clear to auscultation bilaterally, no wheezing, no crackles. Normal respiratory effort. No accessory muscle use.  Cardiovascular: Regular rate and rhythm, no murmurs  Abdomen: Soft, generalized tenderness, abdominal distention present bowel sounds decreased to none Musculoskeletal: Bilateral BKA Skin: no rashes, lesions, ulcers. No induration Neurologic: CN 2-12 grossly intact. Sensation intact, DTR normal. Strength 5/5 in all 4.  Psychiatric: Normal judgment and insight. Alert and oriented x 3. Normal mood.    Labs on Admission: I have personally reviewed following labs and imaging studies  CBC: Recent Labs  Lab 10/06/18 1223  WBC 6.6  HGB 13.2  HCT 41.7  MCV 98.8  PLT 132*   Basic Metabolic Panel: Recent Labs  Lab 10/06/18 1223  NA 140  K 4.9  CL 88*  CO2 32  GLUCOSE 120*  BUN 29*  CREATININE 5.51*  CALCIUM 11.1*   GFR: Estimated Creatinine Clearance: 10.5 mL/min (A) (by C-G formula based on SCr of 5.51 mg/dL (H)). Liver Function Tests: Recent Labs  Lab 10/06/18 1223  AST 13*  ALT 11  ALKPHOS 147*  BILITOT 1.4*  PROT 9.1*  ALBUMIN 3.8   Recent Labs  Lab 10/06/18 1223  LIPASE 30   No results for input(s): AMMONIA in the last 168 hours. Coagulation Profile: No results for input(s): INR, PROTIME in the last 168 hours. Cardiac Enzymes: Recent Labs  Lab 10/06/18 1223  TROPONINI 0.08*   BNP (last 3 results) No results for input(s): PROBNP in the last 8760 hours. HbA1C: No results for input(s): HGBA1C in the last 72 hours. CBG: No results for input(s): GLUCAP in the last 168 hours. Lipid Profile: No results for input(s): CHOL, HDL, LDLCALC, TRIG, CHOLHDL, LDLDIRECT in the last 72 hours. Thyroid  Function Tests: No results for input(s): TSH, T4TOTAL, FREET4, T3FREE, THYROIDAB in the last 72 hours. Anemia Panel: No results for input(s): VITAMINB12, FOLATE, FERRITIN, TIBC, IRON, RETICCTPCT in the last 72 hours. Urine analysis: No results found for: COLORURINE, APPEARANCEUR, LABSPEC, PHURINE, GLUCOSEU, HGBUR, BILIRUBINUR, KETONESUR, PROTEINUR, UROBILINOGEN, NITRITE, LEUKOCYTESUR  Radiological Exams on Admission: Ct Abdomen Pelvis W Contrast  Result Date: 10/06/2018 CLINICAL DATA:  Incarcerated hernia EXAM: CT ABDOMEN AND PELVIS WITH CONTRAST TECHNIQUE: Multidetector CT imaging of the abdomen and pelvis was performed using the standard protocol following bolus administration of intravenous contrast. CONTRAST:  164m OMNIPAQUE IOHEXOL 300 MG/ML  SOLN COMPARISON:  None. FINDINGS: Lower chest: Small left pleural effusion. Bibasilar atelectasis or consolidation. Cardiomegaly Hepatobiliary: Shrunken and somewhat coarse appearing liver. Small gallstones. No gallbladder wall thickening, or biliary dilatation. Pancreas: Unremarkable. No pancreatic ductal dilatation  or surrounding inflammatory changes. Spleen: Normal in size without focal abnormality. Adrenals/Urinary Tract: Adrenal glands are unremarkable. Very atrophic kidneys. Bladder is unremarkable. Stomach/Bowel: The stomach is grossly fluid distended. The proximal small bowel is mildly distended, largest loops measuring 3.9 cm. There is a narrow necked umbilical hernia containing a single loop of mid small bowel and fluid, with decompression of the distal small bowel and colon. Vascular/Lymphatic: Mixed calcific atherosclerosis. No enlarged abdominal or pelvic lymph nodes. Reproductive: Prostatomegaly. Other: No abdominal wall hernia or abnormality. Large volume ascites. Musculoskeletal: Renal osteodystrophy. IMPRESSION: 1. The stomach is grossly fluid distended. The proximal small bowel is mildly distended, largest loops measuring 3.9 cm. There is a  narrow necked umbilical hernia containing a single loop of mid small bowel and fluid, with decompression of the distal small bowel and colon. Findings are concerning for obstruction at the level of an incarcerated hernia. 2.  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 3.  Other chronic and incidental findings as detailed above. Electronically Signed   By: Eddie Candle M.D.   On: 10/06/2018 15:34   Dg Chest Port 1 View  Result Date: 10/06/2018 CLINICAL DATA:  Pt states mid chest pains with nANDv x 5 days, hx pacemaker/defib, diabetes, non smoker EXAM: PORTABLE CHEST 1 VIEW COMPARISON:  Chest x-rays dated 07/07/2014 and 06/30/2014. FINDINGS: Stable cardiomegaly. LEFT chest wall pacemaker/ICD leads appear stable in position. Lungs are clear. No pleural effusion or pneumothorax seen. Osseous structures about the chest are unremarkable. IMPRESSION: No active disease. No evidence of pneumonia or pulmonary edema. Stable cardiomegaly. Electronically Signed   By: Franki Cabot M.D.   On: 10/06/2018 12:59    EKG: Paced  Assessment/Plan Active Problems:   Umbilical hernia, incarcerated   Incarcerated umbilical hernia with small bowel obstruction Admit for surgical intervention. Surgery consulted and plan for OR tonight. Repeat lactate  Persistent nausea and vomiting Symptomatic management with  IV antiemetics and IV pain meds  End-stage renal disease on dialysis Nephrology will be consulted in a.m. for HD.   Hypertension Well-controlled   Chronic systolic heart failure Patient appears compensated.  Fluid management per HD    DVT prophylaxis: Heparin  code Status: Full code Family Communication: none at bedside.  Disposition Plan: pending further management.  Consults called: surgery, nephrology.  Admission status:inpatient.    Hosie Poisson MD [facility] Pager [phone]   If 7PM-7AM, please contact night-coverage [email or web address] Password [facility]  10/06/2018,  4:32 PM

## 2018-10-06 NOTE — ED Triage Notes (Signed)
Chest Pain since Friday, denies radiation. Does endorse n/v. Vomitted x3 so far today.

## 2018-10-06 NOTE — Op Note (Signed)
Indications:  The patient presented with a history of previous umbilical hernia repair without mesh by Dr. Candiss Norse in [place].  This was a suture repair.  The patient has some element of liver dysfunction resulting in significant abdominal ascites.  The patient presents with 1 month of increasing periumbilical pain and 3 days of severe abdominal pain, nausea, vomiting, and no bowel movements.  He was evaluated in the emergency department and was noted to have a bowel obstruction secondary to incarcerated small bowel at the recurrent umbilical hernia.  The patient was examined and we recommended emergent umbilical hernia repair with Phasix mesh.  Pre-operative diagnosis: Incarcerated recurrent umbilical hernia  Post-operative diagnosis:  Same  Procedure:  Umbilical hernia repair with Phasix mesh  Procedure Details  The patient was seen again in the Holding Room. The risks, benefits, complications, treatment options, and expected outcomes were discussed with the patient. The possibilities of reaction to medication, pulmonary aspiration, perforation of viscus, bleeding, recurrent infection, the need for additional procedures, and development of a complication requiring transfusion or further operation were discussed with the patient and/or family. There was concurrence with the proposed plan, and informed consent was obtained. The site of surgery was properly noted/marked. The patient was taken to the Operating Room, identified as Clayton [name], and the procedure verified as umbilical hernia repair. A Time Out was held and the above information confirmed.  After an adequate level of general anesthesia was obtained, the patient's abdomen was prepped with Chloraprep and draped in sterile fashion.  We made a transverse incision above the umbilicus.  Dissection was carefully carried down to the surface of hernia sac.  We dissected bluntly around the hernia sac down to the edge of the fascial defect.   I  opened the hernia sac carefully.  We encountered some bloody ascites.  The small bowel was inflamed and irritated but appeared viable.  I carefully opened the hernia defect slightly to allow for careful examination of the small bowel.  The small bowel proximal and distal to the incarcerated loop appeared completely normal.  Once we released the incarcerated portion it appeared healthier.  There is no sign of ischemia.  There is no sign of perforation.  I made the decision to leave the small bowel.  We reduced this back in the peritoneal cavity.  We suctioned out about 1500 ml of clear ascites. We took down some omental adhesions at the edge of the fascia.  The fascial defect measured 2 cm.  We cleared the fascia in all directions.  A 7 cm round Phasix mesh was inserted into the pre-peritoneal space and was deployed.  The mesh was secured with four trans-fascial sutures of 0 Novofil.  The fascial defect was closed with multiple interrupted figure-of-eight 1 Novofil sutures.  3-0 Vicryl was used to close the subcutaneous tissues and 4-0 Monocryl was used to close the skin.  Steri-strips and clean dressing were applied.  The patient was extubated and brought to the recovery room in stable condition.  All sponge, instrument, and needle counts were correct prior to closure and at the conclusion of the case.   Estimated Blood Loss: Minimal          Complications: None; patient tolerated the procedure well.         Disposition: PACU - hemodynamically stable.         Condition: stable  Imogene Burn. Georgette Dover, MD, [facility] [phone]  10/06/2018 8:51 PM

## 2018-10-06 NOTE — Anesthesia Procedure Notes (Signed)
Arterial Line Insertion Start/End5/24/2020 7:08 PM, 10/06/2018 7:14 PM Performed by: Oleta Mouse, MD, anesthesiologist  Patient location: Pre-op. Preanesthetic checklist: patient identified, IV checked, site marked, risks and benefits discussed, surgical consent, monitors and equipment checked, pre-op evaluation, timeout performed and anesthesia consent Lidocaine 1% used for infiltration Right, radial was placed Catheter size: 20 G Hand hygiene performed  and maximum sterile barriers used   Attempts: 1 Procedure performed without using ultrasound guided technique. Following insertion, dressing applied and Biopatch. Post procedure assessment: normal and unchanged  Patient tolerated the procedure well with no immediate complications.

## 2018-10-06 NOTE — H&P (Signed)
Reason for Consult: incisional hernia Referring Physician: Tarun Patchell is an 73 y.o. male.  HPI: 73 yo male with 1 month of increasing periumbilical pain and 3 days of constant pain with nausea and vomiting. His last bowel movement was 3 days ago. He had similar symptoms in October 2019 at time of last repair.  Past Medical History:  Diagnosis Date   CHF (congestive heart failure) ([place])    Coronary artery disease    Diabetes mellitus without complication ([place])    Renal disorder    S/P bilateral BKA (below knee amputation) ([place])     Past Surgical History:  Procedure Laterality Date   BELOW KNEE LEG AMPUTATION Bilateral    CARDIAC CATHETERIZATION  1998/1999   stents both times, no other details available   CARDIAC DEFIBRILLATOR PLACEMENT  2010   [employer]    History reviewed. No pertinent family history.  Social History:  reports that he has never smoked. He does not have any smokeless tobacco history on file. He reports that he does not drink alcohol or use drugs.  Allergies:  Allergies  Allergen Reactions   Simvastatin Other (See Comments)    unknown    Medications: I have reviewed the patient's current medications.  Results for orders placed or performed during the hospital encounter of 10/06/18 (from the past 48 hour(s))  CBC     Status: Abnormal   Collection Time: 10/06/18 12:23 PM  Result Value Ref Range   WBC 6.6 4.0 - 10.5 K/uL   RBC 4.22 4.22 - 5.81 MIL/uL   Hemoglobin 13.2 13.0 - 17.0 g/dL   HCT 41.7 39.0 - 52.0 %   MCV 98.8 80.0 - 100.0 fL   MCH 31.3 26.0 - 34.0 pg   MCHC 31.7 30.0 - 36.0 g/dL   RDW 15.6 (H) 11.5 - 15.5 %   Platelets 129 (L) 150 - 400 K/uL   nRBC 0.0 0.0 - 0.2 %    Comment: Performed at [facility], [place] [address], [place], [place] [address]  Comprehensive metabolic panel     Status: Abnormal   Collection Time: 10/06/18 12:23 PM  Result Value Ref Range   Sodium 140 135 - 145 mmol/L   Potassium 4.9  3.5 - 5.1 mmol/L   Chloride 88 (L) 98 - 111 mmol/L   CO2 32 22 - 32 mmol/L   Glucose, Bld 120 (H) 70 - 99 mg/dL   BUN 29 (H) 8 - 23 mg/dL   Creatinine, Ser 5.51 (H) 0.61 - 1.24 mg/dL   Calcium 11.1 (H) 8.9 - 10.3 mg/dL   Total Protein 9.1 (H) 6.5 - 8.1 g/dL   Albumin 3.8 3.5 - 5.0 g/dL   AST 13 (L) 15 - 41 U/L   ALT 11 0 - 44 U/L   Alkaline Phosphatase 147 (H) 38 - 126 U/L   Total Bilirubin 1.4 (H) 0.3 - 1.2 mg/dL   GFR calc non Af Amer 10 (L) >60 mL/min   GFR calc Af Amer 11 (L) >60 mL/min   Anion gap 20 (H) 5 - 15    Comment: Performed at [facility], [place] [address], [place], [place] [address]  Lipase, blood     Status: None   Collection Time: 10/06/18 12:23 PM  Result Value Ref Range   Lipase 30 11 - 51 U/L    Comment: Performed at [facility], [place] [address] [place], [place] [address]  Lactic acid, plasma  Status: Abnormal   Collection Time: 10/06/18 12:23 PM  Result Value Ref Range   Lactic Acid, Venous 2.3 (HH) 0.5 - 1.9 mmol/L    Comment: CRITICAL RESULT CALLED TO, READ BACK BY AND VERIFIED WITH: P.PULLIAM,RN @ 1317 10/06/2018 [place] Performed at [facility], [place] [address], [place], [place] [address]   Troponin I - ONCE - STAT     Status: Abnormal   Collection Time: 10/06/18 12:23 PM  Result Value Ref Range   Troponin I 0.08 (HH) <0.03 ng/mL    Comment: CRITICAL RESULT CALLED TO, READ BACK BY AND VERIFIED WITH: P.PULLIAM,RN @ 1346 10/06/2018 [place] Performed at [facility], [place] [address] [place], [place] [address]   SARS Coronavirus 2 (CEPHEID - Performed in [facility] hospital lab), Hosp Order     Status: None   Collection Time: 10/06/18 12:23 PM  Result Value Ref Range   SARS Coronavirus 2 NEGATIVE NEGATIVE    Comment: (NOTE) If result is NEGATIVE SARS-CoV-2 target nucleic acids are NOT DETECTED. The SARS-CoV-2 RNA is generally detectable in upper and lower  respiratory specimens during the acute phase of infection. The lowest   concentration of SARS-CoV-2 viral copies this assay can detect is 250  copies / mL. A negative result does not preclude SARS-CoV-2 infection  and should not be used as the sole basis for treatment or other  patient management decisions.  A negative result may occur with  improper specimen collection / handling, submission of specimen other  than nasopharyngeal swab, presence of viral mutation(s) within the  areas targeted by this assay, and inadequate number of viral copies  (<250 copies / mL). A negative result must be combined with clinical  observations, patient history, and epidemiological information. If result is POSITIVE SARS-CoV-2 target nucleic acids are DETECTED. The SARS-CoV-2 RNA is generally detectable in upper and lower  respiratory specimens dur ing the acute phase of infection.  Positive  results are indicative of active infection with SARS-CoV-2.  Clinical  correlation with patient history and other diagnostic information is  necessary to determine patient infection status.  Positive results do  not rule out bacterial infection or co-infection with other viruses. If result is PRESUMPTIVE POSTIVE SARS-CoV-2 nucleic acids MAY BE PRESENT.   A presumptive positive result was obtained on the submitted specimen  and confirmed on repeat testing.  While 2019 novel coronavirus  (SARS-CoV-2) nucleic acids may be present in the submitted sample  additional confirmatory testing may be necessary for epidemiological  and / or clinical management purposes  to differentiate between  SARS-CoV-2 and other Sarbecovirus currently known to infect humans.  If clinically indicated additional testing with an alternate test  methodology [phone]) is advised. The SARS-CoV-2 RNA is generally  detectable in upper and lower respiratory sp ecimens during the acute  phase of infection. The expected result is Negative. Fact Sheet for Patients:  StrictlyIdeas.no Fact Sheet  for Healthcare Providers: BankingDealers.co.za This test is not yet approved or cleared by the [place] FDA and has been authorized for detection and/or diagnosis of SARS-CoV-2 by FDA under an Emergency Use Authorization (EUA).  This EUA will remain in effect (meaning this test can be used) for the duration of the COVID-19 declaration under Section 564(b)(1) of the Act, 21 U.S.C. section 360bbb-3(b)(1), unless the authorization is terminated or revoked sooner. Performed at [facility], [place] [address], [place], [place] [address]     Ct Abdomen Pelvis W Contrast  Result Date:  10/06/2018 CLINICAL DATA:  Incarcerated hernia EXAM: CT ABDOMEN AND PELVIS WITH CONTRAST TECHNIQUE: Multidetector CT imaging of the abdomen and pelvis was performed using the standard protocol following bolus administration of intravenous contrast. CONTRAST:  1109mL OMNIPAQUE IOHEXOL 300 MG/ML  SOLN COMPARISON:  None. FINDINGS: Lower chest: Small left pleural effusion. Bibasilar atelectasis or consolidation. Cardiomegaly Hepatobiliary: Shrunken and somewhat coarse appearing liver. Small gallstones. No gallbladder wall thickening, or biliary dilatation. Pancreas: Unremarkable. No pancreatic ductal dilatation or surrounding inflammatory changes. Spleen: Normal in size without focal abnormality. Adrenals/Urinary Tract: Adrenal glands are unremarkable. Very atrophic kidneys. Bladder is unremarkable. Stomach/Bowel: The stomach is grossly fluid distended. The proximal small bowel is mildly distended, largest loops measuring 3.9 cm. There is a narrow necked umbilical hernia containing a single loop of mid small bowel and fluid, with decompression of the distal small bowel and colon. Vascular/Lymphatic: Mixed calcific atherosclerosis. No enlarged abdominal or pelvic lymph nodes. Reproductive: Prostatomegaly. Other: No abdominal wall hernia or abnormality. Large volume ascites. Musculoskeletal: Renal  osteodystrophy. IMPRESSION: 1. The stomach is grossly fluid distended. The proximal small bowel is mildly distended, largest loops measuring 3.9 cm. There is a narrow necked umbilical hernia containing a single loop of mid small bowel and fluid, with decompression of the distal small bowel and colon. Findings are concerning for obstruction at the level of an incarcerated hernia. 2.  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 3.  Other chronic and incidental findings as detailed above. Electronically Signed   By: Eddie Candle M.D.   On: 10/06/2018 15:34   Dg Chest Port 1 View  Result Date: 10/06/2018 CLINICAL DATA:  Pt states mid chest pains with nANDv x 5 days, hx pacemaker/defib, diabetes, non smoker EXAM: PORTABLE CHEST 1 VIEW COMPARISON:  Chest x-rays dated 07/07/2014 and 06/30/2014. FINDINGS: Stable cardiomegaly. LEFT chest wall pacemaker/ICD leads appear stable in position. Lungs are clear. No pleural effusion or pneumothorax seen. Osseous structures about the chest are unremarkable. IMPRESSION: No active disease. No evidence of pneumonia or pulmonary edema. Stable cardiomegaly. Electronically Signed   By: Franki Cabot M.D.   On: 10/06/2018 12:59    Review of Systems  Constitutional: Negative for chills and fever.  HENT: Negative for hearing loss.   Eyes: Negative for blurred vision and double vision.  Respiratory: Negative for cough and hemoptysis.   Cardiovascular: Positive for chest pain. Negative for palpitations and orthopnea.  Gastrointestinal: Positive for abdominal pain, constipation, nausea and vomiting.  Genitourinary: Negative for dysuria and urgency.  Musculoskeletal: Negative for myalgias and neck pain.  Skin: Negative for itching and rash.  Neurological: Negative for dizziness, tingling and headaches.  Endo/Heme/Allergies: Does not bruise/bleed easily.  Psychiatric/Behavioral: Negative for depression and suicidal ideas.   Blood pressure 92/65, pulse 70,  temperature 98.3 F (36.8 C), temperature source Oral, resp. rate 20, height 5\' 5"  (1.651 m), weight 68 kg, SpO2 99 %. Physical Exam  Vitals reviewed. Constitutional: He is oriented to person, place, and time. He appears well-developed and well-nourished.  HENT:  Head: Normocephalic and atraumatic.  Eyes: Pupils are equal, round, and reactive to light. Conjunctivae and EOM are normal.  Neck: Normal range of motion. Neck supple.  Cardiovascular: Normal rate and regular rhythm.  Respiratory: Effort normal and breath sounds normal.  GI:  Periumbilical hernia firm unable to reduce, no color changes, tender to palpation, no guarding  Musculoskeletal:     Comments: Bilateral amputee, upper extremities normal ROM  Neurological: He is alert and oriented to person, place, and  time.  Skin: Skin is warm and dry.  Psychiatric: He has a normal mood and affect. His behavior is normal.     Assessment/Plan: 73 yo male with multiple comorbidities presents with second episode of obstructing hernia. Given his comorbidities he is at significant risk of complication of treatment of this acute problem. The best plan of care for him is operative intervention with repair, possible bowel resection and implantation of temporary mesh. He still has a significant risk of recurrence given his ascites on exam and noted at time of previous surgery Metta Clines). He also has significant risk for cardiopulmonary issue after the procedure given his chronic severe systemic diseases (CHF, PAD, ESRD). I discussed this with the patient and he agreed to proceed with operative intervention. -IV abx -NPO -OR for incisional hernia repair -hospitalist to admit due to patient complexity  Arta Bruce Waynette Towers 10/06/2018, 4:32 PM

## 2018-10-06 NOTE — ED Notes (Signed)
ED TO INPATIENT HANDOFF REPORT  ED Nurse Name and Phone #: Arby Barrette RN -[phone]  S Name/Age/Gender Clayton [name] 73 y.o. male Room/Bed: 235T/732K  Code Status   Code Status: Prior  Home/SNF/Other Home Patient oriented to: self, place, time and situation Is this baseline? Yes   Triage Complete: Triage complete  Chief Complaint chest pain  Triage Note Chest Pain since Friday, denies radiation. Does endorse n/v. Vomitted x3 so far today.    Allergies Allergies  Allergen Reactions  . Simvastatin Other (See Comments)    unknown    Level of Care/Admitting Diagnosis ED Disposition    ED Disposition Condition Comment   Admit  Hospital Area: [facility] [[id number]]  Level of Care: Med-Surg [16]  Covid Evaluation: N/A  Diagnosis: Umbilical hernia, incarcerated [025427]  Admitting Physician: Hosie Poisson [[id number]]  Attending Physician: Hosie Poisson [[id number]]  Estimated length of stay: past midnight tomorrow  Certification:: I certify this patient will need inpatient services for at least 2 midnights  PT Class (Do Not Modify): Inpatient [101]  PT Acc Code (Do Not Modify): Private [1]       B Medical/Surgery History Past Medical History:  Diagnosis Date  . CHF (congestive heart failure) ([place])   . Coronary artery disease   . Diabetes mellitus without complication ([place])   . Renal disorder   . S/P bilateral BKA (below knee amputation) [facility])    Past Surgical History:  Procedure Laterality Date  . BELOW KNEE LEG AMPUTATION Bilateral   . CARDIAC CATHETERIZATION  1998/1999   stents both times, no other details available  . CARDIAC DEFIBRILLATOR PLACEMENT  2010   [employer]     A IV Location/Drains/Wounds Patient Lines/Drains/Airways Status   Active Line/Drains/Airways    Name:   Placement date:   Placement time:   Site:   Days:   Peripheral IV 10/06/18 Right Forearm   10/06/18    1223    Forearm   less than 1   Fistula / Graft Left Forearm   -    -     Forearm      Pressure Ulcer 06/28/14 Stage II -  Partial thickness loss of dermis presenting as a shallow open ulcer with a red, pink wound bed without slough. healing   06/28/14    2355     1561   Pressure Ulcer 06/28/14 Stage II -  Partial thickness loss of dermis presenting as a shallow open ulcer with a red, pink wound bed without slough. break in skin , left buttock   06/28/14    2355     1561          Intake/Output Last 24 hours  Intake/Output Summary (Last 24 hours) at 10/06/2018 1651 Last data filed at 10/06/2018 1410 Gross per 24 hour  Intake 500 ml  Output -  Net 500 ml    Labs/Imaging Results for orders placed or performed during the hospital encounter of 10/06/18 (from the past 48 hour(s))  CBC     Status: Abnormal   Collection Time: 10/06/18 12:23 PM  Result Value Ref Range   WBC 6.6 4.0 - 10.5 K/uL   RBC 4.22 4.22 - 5.81 MIL/uL   Hemoglobin 13.2 13.0 - 17.0 g/dL   HCT 41.7 39.0 - 52.0 %   MCV 98.8 80.0 - 100.0 fL   MCH 31.3 26.0 - 34.0 pg   MCHC 31.7 30.0 - 36.0 g/dL   RDW 15.6 (H) 11.5 - 15.5 %   Platelets  129 (L) 150 - 400 K/uL   nRBC 0.0 0.0 - 0.2 %    Comment: Performed at [facility], [place] [address], [place], [place] [address]  Comprehensive metabolic panel     Status: Abnormal   Collection Time: 10/06/18 12:23 PM  Result Value Ref Range   Sodium 140 135 - 145 mmol/L   Potassium 4.9 3.5 - 5.1 mmol/L   Chloride 88 (L) 98 - 111 mmol/L   CO2 32 22 - 32 mmol/L   Glucose, Bld 120 (H) 70 - 99 mg/dL   BUN 29 (H) 8 - 23 mg/dL   Creatinine, Ser 5.51 (H) 0.61 - 1.24 mg/dL   Calcium 11.1 (H) 8.9 - 10.3 mg/dL   Total Protein 9.1 (H) 6.5 - 8.1 g/dL   Albumin 3.8 3.5 - 5.0 g/dL   AST 13 (L) 15 - 41 U/L   ALT 11 0 - 44 U/L   Alkaline Phosphatase 147 (H) 38 - 126 U/L   Total Bilirubin 1.4 (H) 0.3 - 1.2 mg/dL   GFR calc non Af Amer 10 (L) >60 mL/min   GFR calc Af Amer 11 (L) >60 mL/min   Anion gap 20 (H) 5 - 15    Comment: Performed at [facility], [place] [address] [place], [place] [address]  Lipase, blood     Status: None   Collection Time: 10/06/18 12:23 PM  Result Value Ref Range   Lipase 30 11 - 51 U/L    Comment: Performed at [facility], [place] [address], [place], [place] [address]  Lactic acid, plasma     Status: Abnormal   Collection Time: 10/06/18 12:23 PM  Result Value Ref Range   Lactic Acid, Venous 2.3 (HH) 0.5 - 1.9 mmol/L    Comment: CRITICAL RESULT CALLED TO, READ BACK BY AND VERIFIED WITH: P.Bucky Grigg,RN @ 1317 10/06/2018 [place] Performed at [facility], [place] [address], [place], [place] [address]   Troponin I - ONCE - STAT     Status: Abnormal   Collection Time: 10/06/18 12:23 PM  Result Value Ref Range   Troponin I 0.08 (HH) <0.03 ng/mL    Comment: CRITICAL RESULT CALLED TO, READ BACK BY AND VERIFIED WITH: P.Ceejay Kegley,RN @ 1346 10/06/2018 [place] Performed at [facility], [place] [address] [place], [place] [address]   SARS Coronavirus 2 (CEPHEID - Performed in [facility] hospital lab), Hosp Order     Status: None   Collection Time: 10/06/18 12:23 PM  Result Value Ref Range   SARS Coronavirus 2 NEGATIVE NEGATIVE    Comment: (NOTE) If result is NEGATIVE SARS-CoV-2 target nucleic acids are NOT DETECTED. The SARS-CoV-2 RNA is generally detectable in upper and lower  respiratory specimens during the acute phase of infection. The lowest  concentration of SARS-CoV-2 viral copies this assay can detect is 250  copies / mL. A negative result does not preclude SARS-CoV-2 infection  and should not be used as the sole basis for treatment or other  patient management decisions.  A negative result may occur with  improper specimen collection / handling, submission of specimen other  than nasopharyngeal swab, presence of viral mutation(s) within the  areas targeted by this assay, and inadequate number of viral copies  (<250 copies / mL). A negative result must be combined with clinical  observations, patient  history, and epidemiological information. If result is POSITIVE SARS-CoV-2 target nucleic acids are DETECTED. The SARS-CoV-2 RNA is generally detectable in  upper and lower  respiratory specimens dur ing the acute phase of infection.  Positive  results are indicative of active infection with SARS-CoV-2.  Clinical  correlation with patient history and other diagnostic information is  necessary to determine patient infection status.  Positive results do  not rule out bacterial infection or co-infection with other viruses. If result is PRESUMPTIVE POSTIVE SARS-CoV-2 nucleic acids MAY BE PRESENT.   A presumptive positive result was obtained on the submitted specimen  and confirmed on repeat testing.  While 2019 novel coronavirus  (SARS-CoV-2) nucleic acids may be present in the submitted sample  additional confirmatory testing may be necessary for epidemiological  and / or clinical management purposes  to differentiate between  SARS-CoV-2 and other Sarbecovirus currently known to infect humans.  If clinically indicated additional testing with an alternate test  methodology [phone]) is advised. The SARS-CoV-2 RNA is generally  detectable in upper and lower respiratory sp ecimens during the acute  phase of infection. The expected result is Negative. Fact Sheet for Patients:  StrictlyIdeas.no Fact Sheet for Healthcare Providers: BankingDealers.co.za This test is not yet approved or cleared by the [place] FDA and has been authorized for detection and/or diagnosis of SARS-CoV-2 by FDA under an Emergency Use Authorization (EUA).  This EUA will remain in effect (meaning this test can be used) for the duration of the COVID-19 declaration under Section 564(b)(1) of the Act, 21 U.S.C. section 360bbb-3(b)(1), unless the authorization is terminated or revoked sooner. Performed at [facility], [place] [address], [place], [place] [address]     Ct Abdomen Pelvis W Contrast  Result Date: 10/06/2018 CLINICAL DATA:  Incarcerated hernia EXAM: CT ABDOMEN AND PELVIS WITH CONTRAST TECHNIQUE: Multidetector CT imaging of the abdomen and pelvis was performed using the standard protocol following bolus administration of intravenous contrast. CONTRAST:  188mL OMNIPAQUE IOHEXOL 300 MG/ML  SOLN COMPARISON:  None. FINDINGS: Lower chest: Small left pleural effusion. Bibasilar atelectasis or consolidation. Cardiomegaly Hepatobiliary: Shrunken and somewhat coarse appearing liver. Small gallstones. No gallbladder wall thickening, or biliary dilatation. Pancreas: Unremarkable. No pancreatic ductal dilatation or surrounding inflammatory changes. Spleen: Normal in size without focal abnormality. Adrenals/Urinary Tract: Adrenal glands are unremarkable. Very atrophic kidneys. Bladder is unremarkable. Stomach/Bowel: The stomach is grossly fluid distended. The proximal small bowel is mildly distended, largest loops measuring 3.9 cm. There is a narrow necked umbilical hernia containing a single loop of mid small bowel and fluid, with decompression of the distal small bowel and colon. Vascular/Lymphatic: Mixed calcific atherosclerosis. No enlarged abdominal or pelvic lymph nodes. Reproductive: Prostatomegaly. Other: No abdominal wall hernia or abnormality. Large volume ascites. Musculoskeletal: Renal osteodystrophy. IMPRESSION: 1. The stomach is grossly fluid distended. The proximal small bowel is mildly distended, largest loops measuring 3.9 cm. There is a narrow necked umbilical hernia containing a single loop of mid small bowel and fluid, with decompression of the distal small bowel and colon. Findings are concerning for obstruction at the level of an incarcerated hernia. 2.  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 3.  Other chronic and incidental findings as detailed above. Electronically Signed   By: Eddie Candle M.D.   On: 10/06/2018 15:34   Dg Chest  Port 1 View  Result Date: 10/06/2018 CLINICAL DATA:  Pt states mid chest pains with nANDv x 5 days, hx pacemaker/defib, diabetes, non smoker EXAM: PORTABLE CHEST 1 VIEW COMPARISON:  Chest x-rays dated 07/07/2014 and 06/30/2014. FINDINGS: Stable cardiomegaly. LEFT chest wall pacemaker/ICD leads appear stable in position.  Lungs are clear. No pleural effusion or pneumothorax seen. Osseous structures about the chest are unremarkable. IMPRESSION: No active disease. No evidence of pneumonia or pulmonary edema. Stable cardiomegaly. Electronically Signed   By: Franki Cabot M.D.   On: 10/06/2018 12:59    Pending Labs Unresulted Labs (From admission, onward)   None      Vitals/Pain Today's Vitals   10/06/18 1415 10/06/18 1430 10/06/18 1434 10/06/18 1445  BP: (!) 88/54 (!) 90/59  92/65  Pulse: 69 68  70  Resp:  20    Temp:      TempSrc:      SpO2: 100% 98%  99%  Weight:      Height:      PainSc:   7      Isolation Precautions No active isolations  Medications Medications  cefoTEtan (CEFOTAN) 2 g in sodium chloride 0.9 % 100 mL IVPB (has no administration in time range)  sodium chloride 0.9 % bolus 500 mL (0 mLs Intravenous Stopped 10/06/18 1410)  HYDROmorphone (DILAUDID) injection 0.5 mg (0.5 mg Intravenous Given 10/06/18 1333)  ondansetron (ZOFRAN) injection 4 mg (4 mg Intravenous Given 10/06/18 1333)  iohexol (OMNIPAQUE) 300 MG/ML solution 100 mL (100 mLs Intravenous Contrast Given 10/06/18 1509)    Mobility power wheelchair Low fall risk   Focused Assessments Renal Assessment Handoff:  Hemodialysis Schedule: Hemodialysis Schedule: Monday/Wednesday/Friday Last Hemodialysis date and time: 10/04/18   Restricted appendage: left arm     R Recommendations: See Admitting Provider Note  Report given to: 6N  Additional Notes: Pt uses a power wheel chair due to bilateral lower extremity amputation.

## 2018-10-06 NOTE — ED Notes (Signed)
Spoke with his daughter Kenney Houseman [phone].

## 2018-10-06 NOTE — Progress Notes (Signed)
RN notified Hospitalists of patient's blood pressure 93/54.   Patient just returned to [facility] from surgery. Per PACU report this is a normal blood pressure for the patient.  Hospitalists notified.

## 2018-10-06 NOTE — Anesthesia Procedure Notes (Signed)
Procedure Name: Intubation Date/Time: 10/06/2018 6:55 PM Performed by: Suzy Bouchard, CRNA Pre-anesthesia Checklist: Patient identified, Emergency Drugs available, Suction available, Timeout performed and Patient being monitored Patient Re-evaluated:Patient Re-evaluated prior to induction Oxygen Delivery Method: Circle system utilized Preoxygenation: Pre-oxygenation with 100% oxygen Induction Type: IV induction and Rapid sequence Laryngoscope Size: Miller and 2 Grade View: Grade I Tube type: Oral Tube size: 7.5 mm Number of attempts: 1 Airway Equipment and Method: Stylet Placement Confirmation: ETT inserted through vocal cords under direct vision,  positive ETCO2 and breath sounds checked- equal and bilateral Secured at: 25 cm Tube secured with: Tape Dental Injury: Teeth and Oropharynx as per pre-operative assessment

## 2018-10-06 NOTE — ED Provider Notes (Signed)
[facility] Emergency Department Provider Note MRN:  [id number]  Arrival date & time: 10/06/18     Chief Complaint   Chest Pain and Nausea   History of Present Illness   Clayton [name] is a 73 y.o. year-old male with a history of CHF, CAD, diabetes, ESRD presenting to the ED with chief complaint of chest pain and nausea.  Patient explains that he has a midline hernia that is become more firm and tender for the past 2 to 3 weeks.  Became much more painful 2 days ago.  Has not had a bowel movement, is not passing gas for the past 2 days.  Pain began spreading into the central chest today.  Has also been experiencing nausea and multiple episodes of nonbloody nonbilious emesis today.  Denies shortness of breath, no dysuria, no numbness or weakness to the arms or legs, denies fever, endorsing chronic cough that he attributes to his CHF.  Pain is constant, moderate in severity, no exacerbating relieving factors.  Review of Systems  A complete 10 system review of systems was obtained and all systems are negative except as noted in the HPI and PMH.   Patient's Health History    Past Medical History:  Diagnosis Date  . CHF (congestive heart failure) ([place])   . Coronary artery disease   . Diabetes mellitus without complication ([place])   . Renal disorder   . S/P bilateral BKA (below knee amputation) [facility])     Past Surgical History:  Procedure Laterality Date  . BELOW KNEE LEG AMPUTATION Bilateral   . CARDIAC CATHETERIZATION  1998/1999   stents both times, no other details available  . CARDIAC DEFIBRILLATOR PLACEMENT  2010   [employer]    History reviewed. No pertinent family history.  Social History   Socioeconomic History  . Marital status: Single    Spouse name: Not on file  . Number of children: Not on file  . Years of education: Not on file  . Highest education level: Not on file  Occupational History  . Not on file  Social Needs  . Financial resource strain:  Not on file  . Food insecurity:    Worry: Not on file    Inability: Not on file  . Transportation needs:    Medical: Not on file    Non-medical: Not on file  Tobacco Use  . Smoking status: Never Smoker  Substance and Sexual Activity  . Alcohol use: No  . Drug use: No  . Sexual activity: Not on file  Lifestyle  . Physical activity:    Days per week: Not on file    Minutes per session: Not on file  . Stress: Not on file  Relationships  . Social connections:    Talks on phone: Not on file    Gets together: Not on file    Attends religious service: Not on file    Active member of club or organization: Not on file    Attends meetings of clubs or organizations: Not on file    Relationship status: Not on file  . Intimate partner violence:    Fear of current or ex partner: Not on file    Emotionally abused: Not on file    Physically abused: Not on file    Forced sexual activity: Not on file  Other Topics Concern  . Not on file  Social History Narrative  . Not on file     Physical Exam  Vital Signs and Nursing  Notes reviewed Vitals:   10/06/18 1430 10/06/18 1445  BP: (!) 90/59 92/65  Pulse: 68 70  Resp: 20   Temp:    SpO2: 98% 99%    CONSTITUTIONAL: Chronically ill-appearing, NAD NEURO:  Alert and oriented x 3, no focal deficits EYES:  eyes equal and reactive ENT/NECK:  no LAD, no JVD CARDIO: Regular rate, well-perfused, normal S1 and S2 PULM:  CTAB no wheezing or rhonchi GI/GU:  normal bowel sounds, approximately 6 cm firm midline mass of the central abdomen superior to the umbilicus consistent with hernia MSK/SPINE: Bilateral BKA, no edema SKIN:  no rash, atraumatic PSYCH:  Appropriate speech and behavior  Diagnostic and Interventional Summary    EKG Interpretation  Date/Time:  Sunday Oct 06 2018 11:38:07 EDT Ventricular Rate:  70 PR Interval:    QRS Duration: 205 QT Interval:  480 QTC Calculation: 518 R Axis:   124 Text Interpretation:  leads Confirmed  by Gerlene Fee [id number]) on 10/06/2018 11:57:06 AM      Labs Reviewed  CBC - Abnormal; Notable for the following components:      Result Value   RDW 15.6 (*)    Platelets 129 (*)    All other components within normal limits  COMPREHENSIVE METABOLIC PANEL - Abnormal; Notable for the following components:   Chloride 88 (*)    Glucose, Bld 120 (*)    BUN 29 (*)    Creatinine, Ser 5.51 (*)    Calcium 11.1 (*)    Total Protein 9.1 (*)    AST 13 (*)    Alkaline Phosphatase 147 (*)    Total Bilirubin 1.4 (*)    GFR calc non Af Amer 10 (*)    GFR calc Af Amer 11 (*)    Anion gap 20 (*)    All other components within normal limits  LACTIC ACID, PLASMA - Abnormal; Notable for the following components:   Lactic Acid, Venous 2.3 (*)    All other components within normal limits  TROPONIN I - Abnormal; Notable for the following components:   Troponin I 0.08 (*)    All other components within normal limits  SARS CORONAVIRUS 2 (HOSPITAL ORDER, [place])  LIPASE, BLOOD    CT ABDOMEN PELVIS W CONTRAST  Final Result    DG Chest Port 1 View  Final Result      Medications  cefoTEtan (CEFOTAN) 2 g in sodium chloride 0.9 % 100 mL IVPB (has no administration in time range)  sodium chloride 0.9 % bolus 500 mL (0 mLs Intravenous Stopped 10/06/18 1410)  HYDROmorphone (DILAUDID) injection 0.5 mg (0.5 mg Intravenous Given 10/06/18 1333)  ondansetron (ZOFRAN) injection 4 mg (4 mg Intravenous Given 10/06/18 1333)  iohexol (OMNIPAQUE) 300 MG/ML solution 100 mL (100 mLs Intravenous Contrast Given 10/06/18 1509)     Procedures Critical Care Critical Care Documentation Critical care time provided by me (excluding procedures): 35 minutes  Condition necessitating critical care: incarcerated umbilical hernia  Components of critical care management: reviewing of prior records, laboratory and imaging interpretation, frequent re-examination and reassessment of vital signs,  administration of IV fluids, IV opioids, discussion with consulting services.    ED Course and Medical Decision Making  I have reviewed the triage vital signs and the nursing notes.  Pertinent labs & imaging results that were available during my care of the patient were reviewed by me and considered in my medical decision making (see below for details).  Concern for incarcerated hernia and  GI obstruction in this 73 year old male.  Currently with baseline vital signs according to his past medical records, pulse in the 60s to 70s, blood pressure in the 25D systolic.  Will need labs, CT abdomen.  Chest pain favored to be more GI in etiology but will screen with EKG and troponin.  Clinical Course as of Oct 06 1631  Sun Oct 06, 2018  1550 CT confirms incarcerated hernia causing bowel obstruction, will consult general surgery   [MB]  1615 Discussed options with general surgeon, patient, patient's daughter.  Had initially considered bedside sedation and reduction attempt.  However after being present in the options, patient's daughter more in favor of surgical management, especially considering that patient had the same issue that was managed in the operating room.  Will admit to internal medicine and defer procedural management to general surgery.   [MB]    Clinical Course User Index [MB] Maudie Flakes, MD    Clayton [name] was evaluated in Emergency Department on 10/06/2018 for the symptoms described in the history of present illness. He was evaluated in the context of the global COVID-19 pandemic, which necessitated consideration that the patient might be at risk for infection with the SARS-CoV-2 virus that causes COVID-19. Institutional protocols and algorithms that pertain to the evaluation of patients at risk for COVID-19 are in a state of rapid change based on information released by regulatory bodies including the CDC and federal and state organizations. These policies and algorithms were  followed during the patient's care in the ED.   Barth Kirks. Sedonia Small, [place] [email or web address]  Final Clinical Impressions(s) / ED Diagnoses     ICD-10-CM   1. Umbilical hernia, incarcerated K42.0   2. Chest pain R07.9 DG Chest [facility] 1 View    DG Chest [facility]    ED Discharge Orders    None         Maudie Flakes, MD 10/06/18 [phone]

## 2018-10-06 NOTE — ED Notes (Signed)
Spoke with his wife Mohmmad Saleeby [phone].

## 2018-10-06 NOTE — ED Notes (Signed)
Attempted to give report to 6N.

## 2018-10-07 ENCOUNTER — Encounter (HOSPITAL_COMMUNITY): Payer: Self-pay | Admitting: Nephrology

## 2018-10-07 LAB — BASIC METABOLIC PANEL
Anion gap: 15 (ref 5–15)
BUN: 34 mg/dL — ABNORMAL HIGH (ref 8–23)
CO2: 29 mmol/L (ref 22–32)
Calcium: 9.4 mg/dL (ref 8.9–10.3)
Chloride: 97 mmol/L — ABNORMAL LOW (ref 98–111)
Creatinine, Ser: 5.71 mg/dL — ABNORMAL HIGH (ref 0.61–1.24)
GFR calc Af Amer: 11 mL/min — ABNORMAL LOW (ref >60)
GFR calc non Af Amer: 9 mL/min — ABNORMAL LOW (ref >60)
Glucose, Bld: 122 mg/dL — ABNORMAL HIGH (ref 70–99)
Potassium: 5.7 mmol/L — ABNORMAL HIGH (ref 3.5–5.1)
Sodium: 141 mmol/L (ref 135–145)

## 2018-10-07 LAB — CBC
HCT: 38.7 % — ABNORMAL LOW (ref 39.0–52.0)
Hemoglobin: 12.1 g/dL — ABNORMAL LOW (ref 13.0–17.0)
MCH: 31.3 pg (ref 26.0–34.0)
MCHC: 31.3 g/dL (ref 30.0–36.0)
MCV: 100.3 fL — ABNORMAL HIGH (ref 80.0–100.0)
Platelets: 95 10*3/uL — ABNORMAL LOW (ref 150–400)
RBC: 3.86 MIL/uL — ABNORMAL LOW (ref 4.22–5.81)
RDW: 15.7 % — ABNORMAL HIGH (ref 11.5–15.5)
WBC: 8.7 10*3/uL (ref 4.0–10.5)
nRBC: 0 % (ref 0.0–0.2)

## 2018-10-07 MED ORDER — CHLORHEXIDINE GLUCONATE CLOTH 2 % EX PADS
6.0000 | MEDICATED_PAD | Freq: Every day | CUTANEOUS | Status: DC
  Administered 2018-10-07 – 2018-10-13 (×7): 6 via TOPICAL

## 2018-10-07 NOTE — Progress Notes (Signed)
RN paged Dr. Gershon Crane regarding the NGT.   Patient pulled NGT out. Patient is stable. Patient is not nauseated or vomiting.  Dr. Gershon Crane advised RN to leave the NGT out and that patient can have ice chips.   Nursing will continue to monitor.

## 2018-10-07 NOTE — Plan of Care (Signed)
Problem: Education: Goal: Knowledge of General Education information will improve Description Including pain rating scale, medication(s)/side effects and non-pharmacologic comfort measures Outcome: Progressing   Problem: Health Behavior/Discharge Planning: Goal: Ability to manage health-related needs will improve Outcome: Progressing   Problem: Clinical Measurements: Goal: Respiratory complications will improve Outcome: Progressing Goal: Cardiovascular complication will be avoided Outcome: Progressing   Problem: Nutrition: Goal: Adequate nutrition will be maintained Outcome: Progressing   Problem: Coping: Goal: Level of anxiety will decrease Outcome: Progressing   Problem: Pain Managment: Goal: General experience of comfort will improve Outcome: Progressing   Problem: Safety: Goal: Ability to remain free from injury will improve Outcome: Progressing   Problem: Skin Integrity: Goal: Risk for impaired skin integrity will decrease Outcome: Progressing

## 2018-10-07 NOTE — Progress Notes (Signed)
Ruffin Pyo, RN called and notified that this patient Hemodialysis treatment had been moved to 10/08/2018.

## 2018-10-07 NOTE — Progress Notes (Signed)
Per Kennon Rounds, RN in HD, pt's dialysis moved to tomorrow, Tuesday 10/08/2018. Pt aware. Will continue to monitor.

## 2018-10-07 NOTE — Consult Note (Signed)
Renal Service Consult Note [facility]  Clayton [name] 10/07/2018 Sol [place]azing Requesting Physician:  Dr Loletha Grayer. Florene [place]  Reason for Consult:  ESRD pt w/ abd hernia HPI: The patient is a 73 y.o. year-old with hx of DM2, CAD, cHF, bilat BKA and ESRD on HD x 11 yrs in [place], [place].  Patient admitted yest for SBO w/ incarc umbilical hernia. Went to OR overnight for repair and we are asked to see for ESRD.    Pt denies any SOB, leg swelling.  Lives alone, takes care of himself.  No c/o's.    ROS  denies CP  no joint pain   no HA  no blurry vision  no rash, no diarrhea     Past Medical History  Past Medical History:  Diagnosis Date  . CHF (congestive heart failure) ([place])   . Coronary artery disease   . Diabetes mellitus without complication ([place])   . Renal disorder   . S/P bilateral BKA (below knee amputation) [facility])    Past Surgical History  Past Surgical History:  Procedure Laterality Date  . BELOW KNEE LEG AMPUTATION Bilateral   . CARDIAC CATHETERIZATION  1998/1999   stents both times, no other details available  . CARDIAC DEFIBRILLATOR PLACEMENT  2010   [employer]   Family History History reviewed. No pertinent family history. Social History  reports that he has never smoked. He does not have any smokeless tobacco history on file. He reports that he does not drink alcohol or use drugs. Allergies  Allergies  Allergen Reactions  . Simvastatin Other (See Comments)    unknown   Home medications Prior to Admission medications   Medication Sig Start Date End Date Taking? Authorizing Provider  feeding supplement, RESOURCE BREEZE, (RESOURCE BREEZE) LIQD Take 1 Container by mouth 3 (three) times daily between meals. 07/10/14   Ghimire, Henreitta Leber, MD  hydrocortisone sodium succinate (SOLU-CORTEF) 100 MG SOLR injection Inject 0.5 mLs (25 mg total) into the vein every 12 (twelve) hours. 07/10/14   Ghimire, Henreitta Leber, MD  insulin aspart (NOVOLOG) 100 UNIT/ML  injection Inject 0-9 Units into the skin every 4 (four) hours. 07/10/14   Ghimire, Henreitta Leber, MD  midodrine (PROAMATINE) 10 MG tablet Take 1 tablet (10 mg total) by mouth 3 (three) times daily with meals. 07/10/14   Jonetta Osgood, MD   Liver Function Tests Recent Labs  Lab 10/06/18 1223  AST 13*  ALT 11  ALKPHOS 147*  BILITOT 1.4*  PROT 9.1*  ALBUMIN 3.8   Recent Labs  Lab 10/06/18 1223  LIPASE 30   CBC Recent Labs  Lab 10/06/18 1223 10/06/18 1926 10/07/18 0434  WBC 6.6  --  8.7  HGB 13.2 12.2* 12.1*  HCT 41.7 36.0* 38.7*  MCV 98.8  --  100.3*  PLT 129*  --  95*   Basic Metabolic Panel Recent Labs  Lab 10/06/18 1223 10/06/18 1926 10/07/18 0434  NA 140 136 141  K 4.9 5.0 5.7*  CL 88*  --  97*  CO2 32  --  29  GLUCOSE 120* 133* 122*  BUN 29*  --  34*  CREATININE 5.51*  --  5.71*  CALCIUM 11.1*  --  9.4   Iron/TIBC/Ferritin/ %Sat No results found for: IRON, TIBC, FERRITIN, IRONPCTSAT  Vitals:   10/06/18 2205 10/07/18 0136 10/07/18 0507 10/07/18 0845  BP: (!) 93/54 (!) 98/59 (!) 100/52 (!) 89/52  Pulse: 69 70 71 70  Resp: 16 16 16  Temp: 98 F (36.7 C) 97.8 F (36.6 C) 98.7 F (37.1 C) 98.6 F (37 C)  TempSrc:  Oral Oral Oral  SpO2: 98% 100% 100% 98%  Weight: 71.3 kg     Height: 5\' 5"  (1.651 m)      Exam Gen alert, chron ill and pleasant No rash, cyanosis or gangrene Sclera anicteric, throat clea  No jvd or bruits Chest clear bilat no rales or wheezing RRR no MRG Abd soft ntnd no mass or ascites +bs GU normal male MS bilat BKA Ext no LE edema, no wounds or ulcers Neuro is alert, Ox 3 , nf L FA AVF +bruit   Home meds:  - midodrine 10 tid  - insluin aspart ac ssi    MWF [facility]  4h  72.5kg   Heparin none  L AVF   Assessment: 1. Incarb umbilical hernia w/ SBO - sp OR repair last niight 2. ESRD - on HD MWF as above.  3. Chronic hypotension - on midodrine, usual BP's 80's - 100's, drops into 70's on HD regularly 4. Volume - no  vol excess, under dry wt 5. DM2 on insulin 6. Hx CHF/ AICD implant 7. Anemia ckd - Hb 12 today, no need esa 8. Hyperkalemia - K 5.7    Plan: 1. HD later today or tonight, low K+ diet when eating, will follow.       Kelly Splinter  MD 10/07/2018, 11:18 AM

## 2018-10-07 NOTE — Plan of Care (Signed)
  Problem: Education: Goal: Knowledge of General Education information will improve Description: Including pain rating scale, medication(s)/side effects and non-pharmacologic comfort measures Outcome: Progressing   Problem: Skin Integrity: Goal: Risk for impaired skin integrity will decrease Outcome: Progressing   

## 2018-10-07 NOTE — Progress Notes (Signed)
RN notified [facility] notification of 7-beat run of PVCs.

## 2018-10-07 NOTE — Progress Notes (Signed)
1 Day Post-Op  Subjective: CC: Abdominal pain Patient reports abdominal pain over surgical site. He reports this is minimal and controlled with oral pain medication. He feels much better this morning. No N/V. Tolerating ice chips.  Unfortunately, he did pull his NGT last night. No flatus or BM.   Objective: Vital signs in last 24 hours: Temp:  [97.8 F (36.6 C)-98.7 F (37.1 C)] 98.7 F (37.1 C) (05/25 0507) Pulse Rate:  [68-75] 71 (05/25 0507) Resp:  [16-26] 16 (05/25 0507) BP: (83-106)/(51-76) 100/52 (05/25 0507) SpO2:  [94 %-100 %] 100 % (05/25 0507) Weight:  [68 kg-71.3 kg] 71.3 kg (05/24 2205)    Intake/Output from previous day: 05/24 0701 - 05/25 0700 In: 2000 [I.V.:1500; IV Piggyback:500] Out: 2625 [Emesis/NG output:100; Blood:25] Intake/Output this shift: No intake/output data recorded.  PE: Gen: Awake and alert, NAD Lungs: normal effort Abd: Soft, ND, appropriately tender around surgical incision which has tegederm over and appears clean and dry. +BS Msk: Bilateral BKA  Lab Results:  Recent Labs    10/06/18 1223 10/06/18 1926 10/07/18 0434  WBC 6.6  --  8.7  HGB 13.2 12.2* 12.1*  HCT 41.7 36.0* 38.7*  PLT 129*  --  95*   BMET Recent Labs    10/06/18 1223 10/06/18 1926 10/07/18 0434  NA 140 136 141  K 4.9 5.0 5.7*  CL 88*  --  97*  CO2 32  --  29  GLUCOSE 120* 133* 122*  BUN 29*  --  34*  CREATININE 5.51*  --  5.71*  CALCIUM 11.1*  --  9.4   PT/INR No results for input(s): LABPROT, INR in the last 72 hours. CMP     Component Value Date/Time   NA 141 10/07/2018 0434   NA 137 11/26/2012 1131   K 5.7 (H) 10/07/2018 0434   K 3.5 11/26/2012 1131   CL 97 (L) 10/07/2018 0434   CL 100 11/26/2012 1131   CO2 29 10/07/2018 0434   CO2 29 11/26/2012 1131   GLUCOSE 122 (H) 10/07/2018 0434   GLUCOSE 90 11/26/2012 1131   BUN 34 (H) 10/07/2018 0434   BUN 36 (H) 11/26/2012 1131   CREATININE 5.71 (H) 10/07/2018 0434   CREATININE 5.93 (H)  11/26/2012 1131   CALCIUM 9.4 10/07/2018 0434   CALCIUM 9.4 11/26/2012 1131   PROT 9.1 (H) 10/06/2018 1223   PROT 7.5 11/26/2012 1131   ALBUMIN 3.8 10/06/2018 1223   ALBUMIN 2.2 (L) 11/26/2012 1131   AST 13 (L) 10/06/2018 1223   AST 15 11/26/2012 1131   ALT 11 10/06/2018 1223   ALT 11 (L) 11/26/2012 1131   ALKPHOS 147 (H) 10/06/2018 1223   ALKPHOS 228 (H) 11/26/2012 1131   BILITOT 1.4 (H) 10/06/2018 1223   BILITOT 0.9 11/26/2012 1131   GFRNONAA 9 (L) 10/07/2018 0434   GFRNONAA 9 (L) 11/26/2012 1131   GFRAA 11 (L) 10/07/2018 0434   GFRAA 11 (L) 11/26/2012 1131   Lipase     Component Value Date/Time   LIPASE 30 10/06/2018 1223       Studies/Results: Ct Abdomen Pelvis W Contrast  Result Date: 10/06/2018 CLINICAL DATA:  Incarcerated hernia EXAM: CT ABDOMEN AND PELVIS WITH CONTRAST TECHNIQUE: Multidetector CT imaging of the abdomen and pelvis was performed using the standard protocol following bolus administration of intravenous contrast. CONTRAST:  121mL OMNIPAQUE IOHEXOL 300 MG/ML  SOLN COMPARISON:  None. FINDINGS: Lower chest: Small left pleural effusion. Bibasilar atelectasis or consolidation. Cardiomegaly Hepatobiliary: Shrunken and  somewhat coarse appearing liver. Small gallstones. No gallbladder wall thickening, or biliary dilatation. Pancreas: Unremarkable. No pancreatic ductal dilatation or surrounding inflammatory changes. Spleen: Normal in size without focal abnormality. Adrenals/Urinary Tract: Adrenal glands are unremarkable. Very atrophic kidneys. Bladder is unremarkable. Stomach/Bowel: The stomach is grossly fluid distended. The proximal small bowel is mildly distended, largest loops measuring 3.9 cm. There is a narrow necked umbilical hernia containing a single loop of mid small bowel and fluid, with decompression of the distal small bowel and colon. Vascular/Lymphatic: Mixed calcific atherosclerosis. No enlarged abdominal or pelvic lymph nodes. Reproductive: Prostatomegaly.  Other: No abdominal wall hernia or abnormality. Large volume ascites. Musculoskeletal: Renal osteodystrophy. IMPRESSION: 1. The stomach is grossly fluid distended. The proximal small bowel is mildly distended, largest loops measuring 3.9 cm. There is a narrow necked umbilical hernia containing a single loop of mid small bowel and fluid, with decompression of the distal small bowel and colon. Findings are concerning for obstruction at the level of an incarcerated hernia. 2.  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 3.  Other chronic and incidental findings as detailed above. Electronically Signed   By: Eddie Candle M.D.   On: 10/06/2018 15:34   Dg Chest Port 1 View  Result Date: 10/06/2018 CLINICAL DATA:  Pt states mid chest pains with nANDv x 5 days, hx pacemaker/defib, diabetes, non smoker EXAM: PORTABLE CHEST 1 VIEW COMPARISON:  Chest x-rays dated 07/07/2014 and 06/30/2014. FINDINGS: Stable cardiomegaly. LEFT chest wall pacemaker/ICD leads appear stable in position. Lungs are clear. No pleural effusion or pneumothorax seen. Osseous structures about the chest are unremarkable. IMPRESSION: No active disease. No evidence of pneumonia or pulmonary edema. Stable cardiomegaly. Electronically Signed   By: Franki Cabot M.D.   On: 10/06/2018 12:59    Anti-infectives: Anti-infectives (From admission, onward)   Start     Dose/Rate Route Frequency Ordered Stop   10/07/18 0600  cefoTEtan (CEFOTAN) 2 g in sodium chloride 0.9 % 100 mL IVPB     2 g 200 mL/hr over 30 Minutes Intravenous To [facility] 10/06/18 1632 10/06/18 1936       Assessment/Plan DM CAD CHF ESRD on HD (M/W/F) - nephrology consulted by primary  Hypotension - fluid management per primary team and nephrology   Incarcerated recurrent umbilical hernia S/p Umbilical hernia repair with Phasix mesh - Dr. Donnie Mesa - 10/06/2018 - POD #1 - High risk for ileus. Reported to have 2.5L out from NGT in OR. NGT pulled by  patient on floor. Currently no N/V. Tolerating ice chips. Will give sips of clears from floor - OOB chair.  - IS  FEN - Sips of clears from floor, K 5.7 VTE - Lovenox (prophlactic-renal dosing) ID - Cefotetan peri-op, WBC 8.7    LOS: 1 day    Jillyn Ledger , [facility] 10/07/2018, 8:27 AM Pager: [phone]

## 2018-10-07 NOTE — Progress Notes (Signed)
PROGRESS NOTE    Clayton [name]  VOH:[id number] DOB: [birth year] DOA: 10/06/2018 PCP: [name], PA-C (Inactive)   Brief Narrative:  Clayton [name] is Clayton [name] 73 y.o. male with medical history significant of ESRD on dialysis Monday Wednesday Friday, chronic systolic heart failure, diabetes mellitus, bilateral BKA, coronary artery disease, umbilical hernia presents today with abdominal pain for 2 to 3 weeks worsened over the last 24 hours.  Patient denies any fever or chills denies headache shortness of breath chest pain back pain, dysuria.  He reports persistent nausea and multiple episodes of nonbloody nonbilious vomiting.  Abdominal pain is mostly around the umbilicus nonradiating and moderate to severe and constant.  Assessment & Plan:   Active Problems:   Umbilical hernia, incarcerated  Incarcerated umbilical hernia with small bowel obstruction S/p umbilical hernia repair with phasix mesh on 5/24 Pt high risk for ileus, NGT dislodged Getting sips of clears per surgery OOB, IS Appreciate surgery assistance  ESRD on MWF dialysis   Mild Hyperkalemia: Nephrology c/s, dialysis later today  Persistent nausea and vomiting Symptomatic management with  IV antiemetics and IV pain meds  Hypotension: pt takes midodrine   Chronic systolic heart failure Patient appears compensated.  Fluid management per HD  Elevated troponin: lower than previous in setting of SBO and ESRD.  No c/o c/w ACS.   Thrombocytopenia: follow  Possible Cirrhosis based on imaging: follow CMP, INR, CBC  S/p bilateral BKA   DVT prophylaxis: lovenox Code Status: full  Family Communication: none at bedside Disposition Plan: pending surgical clearance   Consultants:   Nephrology  surgery  Procedures:  S/p umbilical hernia repair with phasix mesh on 5/24  Antimicrobials: Anti-infectives (From admission, onward)   Start     Dose/Rate Route Frequency Ordered Stop   10/07/18 0600  cefoTEtan  (CEFOTAN) 2 g in sodium chloride 0.9 % 100 mL IVPB     2 g 200 mL/hr over 30 Minutes Intravenous To [facility] 10/06/18 1632 10/06/18 1936     Subjective: No complaints. Clayton [name]&Ox3.  Objective: Vitals:   10/07/18 0136 10/07/18 0507 10/07/18 0845 10/07/18 1307  BP: (!) 98/59 (!) 100/52 (!) 89/52 93/62  Pulse: 70 71 70 70  Resp: 16 16    Temp: 97.8 F (36.6 C) 98.7 F (37.1 C) 98.6 F (37 C) 97.8 F (36.6 C)  TempSrc: Oral Oral Oral Oral  SpO2: 100% 100% 98% 100%  Weight:      Height:        Intake/Output Summary (Last 24 hours) at 10/07/2018 1640 Last data filed at 10/07/2018 6948 Gross per 24 hour  Intake 1620 ml  Output 2625 ml  Net -1005 ml   Filed Weights   10/06/18 1138 10/06/18 2205  Weight: 68 kg 71.3 kg    Examination:  General exam: Appears calm and comfortable  Respiratory system: Clear to auscultation. Respiratory effort normal. Cardiovascular system: S1 & S2 heard, RRR. Gastrointestinal system: Abdomen is nondistended, soft and nontender. Dressing c/d/i.  Central nervous system: Alert and oriented. No focal neurological deficits. Extremities: bilateral BKA Skin: No rashes, lesions or ulcers Psychiatry: Judgement and insight appear normal. Mood & affect appropriate.     Data Reviewed: I have personally reviewed following labs and imaging studies  CBC: Recent Labs  Lab 10/06/18 1223 10/06/18 1926 10/07/18 0434  WBC 6.6  --  8.7  HGB 13.2 12.2* 12.1*  HCT 41.7 36.0* 38.7*  MCV 98.8  --  100.3*  PLT 129*  --  95*  Basic Metabolic Panel: Recent Labs  Lab 10/06/18 1223 10/06/18 1926 10/07/18 0434  NA 140 136 141  K 4.9 5.0 5.7*  CL 88*  --  97*  CO2 32  --  29  GLUCOSE 120* 133* 122*  BUN 29*  --  34*  CREATININE 5.51*  --  5.71*  CALCIUM 11.1*  --  9.4   GFR: Estimated Creatinine Clearance: 10.2 mL/min (Clayton [name]) (by C-G formula based on SCr of 5.71 mg/dL (H)). Liver Function Tests: Recent Labs  Lab 10/06/18 1223  AST 13*  ALT 11   ALKPHOS 147*  BILITOT 1.4*  PROT 9.1*  ALBUMIN 3.8   Recent Labs  Lab 10/06/18 1223  LIPASE 30   No results for input(s): AMMONIA in the last 168 hours. Coagulation Profile: No results for input(s): INR, PROTIME in the last 168 hours. Cardiac Enzymes: Recent Labs  Lab 10/06/18 1223  TROPONINI 0.08*   BNP (last 3 results) No results for input(s): PROBNP in the last 8760 hours. HbA1C: No results for input(s): HGBA1C in the last 72 hours. CBG: Recent Labs  Lab 10/06/18 2052  GLUCAP 122*   Lipid Profile: No results for input(s): CHOL, HDL, LDLCALC, TRIG, CHOLHDL, LDLDIRECT in the last 72 hours. Thyroid Function Tests: No results for input(s): TSH, T4TOTAL, FREET4, T3FREE, THYROIDAB in the last 72 hours. Anemia Panel: No results for input(s): VITAMINB12, FOLATE, FERRITIN, TIBC, IRON, RETICCTPCT in the last 72 hours. Sepsis Labs: Recent Labs  Lab 10/06/18 1223  LATICACIDVEN 2.3*    Recent Results (from the past 240 hour(s))  SARS Coronavirus 2 (CEPHEID - Performed in [place] hospital lab), Hosp Order     Status: None   Collection Time: 10/06/18 12:23 PM  Result Value Ref Range Status   SARS Coronavirus 2 NEGATIVE NEGATIVE Final    Comment: (NOTE) If result is NEGATIVE SARS-CoV-2 target nucleic acids are NOT DETECTED. The SARS-CoV-2 RNA is generally detectable in upper and lower  respiratory specimens during the acute phase of infection. The lowest  concentration of SARS-CoV-2 viral copies this assay can detect is 250  copies / mL. Clayton [name] negative result does not preclude SARS-CoV-2 infection  and should not be used as the sole basis for treatment or other  patient management decisions.  Clayton [name] negative result may occur with  improper specimen collection / handling, submission of specimen other  than nasopharyngeal swab, presence of viral mutation(s) within the  areas targeted by this assay, and inadequate number of viral copies  (<250 copies / mL). Clayton [name] negative result  must be combined with clinical  observations, patient history, and epidemiological information. If result is POSITIVE SARS-CoV-2 target nucleic acids are DETECTED. The SARS-CoV-2 RNA is generally detectable in upper and lower  respiratory specimens dur ing the acute phase of infection.  Positive  results are indicative of active infection with SARS-CoV-2.  Clinical  correlation with patient history and other diagnostic information is  necessary to determine patient infection status.  Positive results do  not rule out bacterial infection or co-infection with other viruses. If result is PRESUMPTIVE POSTIVE SARS-CoV-2 nucleic acids MAY BE PRESENT.   Keonna Raether presumptive positive result was obtained on the submitted specimen  and confirmed on repeat testing.  While 2019 novel coronavirus  (SARS-CoV-2) nucleic acids may be present in the submitted sample  additional confirmatory testing may be necessary for epidemiological  and / or clinical management purposes  to differentiate between  SARS-CoV-2 and other Sarbecovirus currently known to infect humans.  If  clinically indicated additional testing with an alternate test  methodology [phone]) is advised. The SARS-CoV-2 RNA is generally  detectable in upper and lower respiratory sp ecimens during the acute  phase of infection. The expected result is Negative. Fact Sheet for Patients:  StrictlyIdeas.no Fact Sheet for Healthcare Providers: BankingDealers.co.za This test is not yet approved or cleared by the [place] FDA and has been authorized for detection and/or diagnosis of SARS-CoV-2 by FDA under an Emergency Use Authorization (EUA).  This EUA will remain in effect (meaning this test can be used) for the duration of the COVID-19 declaration under Section 564(b)(1) of the Act, 21 U.S.C. section 360bbb-3(b)(1), unless the authorization is terminated or revoked sooner. Performed at [facility], [place] [address] [place], [place] [address]          Radiology Studies: Ct Abdomen Pelvis W Contrast  Result Date: 10/06/2018 CLINICAL DATA:  Incarcerated hernia EXAM: CT ABDOMEN AND PELVIS WITH CONTRAST TECHNIQUE: Multidetector CT imaging of the abdomen and pelvis was performed using the standard protocol following bolus administration of intravenous contrast. CONTRAST:  159mL OMNIPAQUE IOHEXOL 300 MG/ML  SOLN COMPARISON:  None. FINDINGS: Lower chest: Small left pleural effusion. Bibasilar atelectasis or consolidation. Cardiomegaly Hepatobiliary: Shrunken and somewhat coarse appearing liver. Small gallstones. No gallbladder wall thickening, or biliary dilatation. Pancreas: Unremarkable. No pancreatic ductal dilatation or surrounding inflammatory changes. Spleen: Normal in size without focal abnormality. Adrenals/Urinary Tract: Adrenal glands are unremarkable. Very atrophic kidneys. Bladder is unremarkable. Stomach/Bowel: The stomach is grossly fluid distended. The proximal small bowel is mildly distended, largest loops measuring 3.9 cm. There is Nikiah Goin narrow necked umbilical hernia containing Natalea Sutliff single loop of mid small bowel and fluid, with decompression of the distal small bowel and colon. Vascular/Lymphatic: Mixed calcific atherosclerosis. No enlarged abdominal or pelvic lymph nodes. Reproductive: Prostatomegaly. Other: No abdominal wall hernia or abnormality. Large volume ascites. Musculoskeletal: Renal osteodystrophy. IMPRESSION: 1. The stomach is grossly fluid distended. The proximal small bowel is mildly distended, largest loops measuring 3.9 cm. There is Vergia Chea narrow necked umbilical hernia containing Lauria Depoy single loop of mid small bowel and fluid, with decompression of the distal small bowel and colon. Findings are concerning for obstruction at the level of an incarcerated hernia. 2.  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 3.  Other chronic and incidental findings as detailed  above. Electronically Signed   By: Eddie Candle M.D.   On: 10/06/2018 15:34   Dg Chest Port 1 View  Result Date: 10/06/2018 CLINICAL DATA:  Pt states mid chest pains with nANDv x 5 days, hx pacemaker/defib, diabetes, non smoker EXAM: PORTABLE CHEST 1 VIEW COMPARISON:  Chest x-rays dated 07/07/2014 and 06/30/2014. FINDINGS: Stable cardiomegaly. LEFT chest wall pacemaker/ICD leads appear stable in position. Lungs are clear. No pleural effusion or pneumothorax seen. Osseous structures about the chest are unremarkable. IMPRESSION: No active disease. No evidence of pneumonia or pulmonary edema. Stable cardiomegaly. Electronically Signed   By: Franki Cabot M.D.   On: 10/06/2018 12:59        Scheduled Meds:  Chlorhexidine Gluconate Cloth  6 each Topical Q0600   enoxaparin (LOVENOX) injection  30 mg Subcutaneous Q24H   midodrine  10 mg Oral TID WC   Continuous Infusions:   LOS: 1 day    Time spent: over 30 min    Fayrene Helper, MD [facility] Pager AMION  If 7PM-7AM, please contact night-coverage [email or web address] Password [email or web address] 10/07/2018, 4:40 PM

## 2018-10-08 ENCOUNTER — Encounter (HOSPITAL_COMMUNITY): Payer: Self-pay | Admitting: Surgery

## 2018-10-08 DIAGNOSIS — L8990 Pressure ulcer of unspecified site, unspecified stage: Secondary | ICD-10-CM | POA: Diagnosis present

## 2018-10-08 LAB — CBC
HCT: 35.9 % — ABNORMAL LOW (ref 39.0–52.0)
HCT: 35.9 % — ABNORMAL LOW (ref 39.0–52.0)
Hemoglobin: 11.6 g/dL — ABNORMAL LOW (ref 13.0–17.0)
Hemoglobin: 11.5 g/dL — ABNORMAL LOW (ref 13.0–17.0)
MCH: 32 pg (ref 26.0–34.0)
MCH: 31.8 pg (ref 26.0–34.0)
MCHC: 32.3 g/dL (ref 30.0–36.0)
MCHC: 32 g/dL (ref 30.0–36.0)
MCV: 98.9 fL (ref 80.0–100.0)
MCV: 99.2 fL (ref 80.0–100.0)
Platelets: 117 10*3/uL — ABNORMAL LOW (ref 150–400)
Platelets: 125 10*3/uL — ABNORMAL LOW (ref 150–400)
RBC: 3.63 MIL/uL — ABNORMAL LOW (ref 4.22–5.81)
RBC: 3.62 MIL/uL — ABNORMAL LOW (ref 4.22–5.81)
RDW: 15.9 % — ABNORMAL HIGH (ref 11.5–15.5)
RDW: 15.8 % — ABNORMAL HIGH (ref 11.5–15.5)
WBC: 8.1 10*3/uL (ref 4.0–10.5)
WBC: 8.3 10*3/uL (ref 4.0–10.5)
nRBC: 0 % (ref 0.0–0.2)
nRBC: 0 % (ref 0.0–0.2)

## 2018-10-08 LAB — COMPREHENSIVE METABOLIC PANEL
ALT: 8 U/L (ref 0–44)
AST: 10 U/L — ABNORMAL LOW (ref 15–41)
Albumin: 3 g/dL — ABNORMAL LOW (ref 3.5–5.0)
Alkaline Phosphatase: 108 U/L (ref 38–126)
Anion gap: 15 (ref 5–15)
BUN: 55 mg/dL — ABNORMAL HIGH (ref 8–23)
CO2: 27 mmol/L (ref 22–32)
Calcium: 9.2 mg/dL (ref 8.9–10.3)
Chloride: 95 mmol/L — ABNORMAL LOW (ref 98–111)
Creatinine, Ser: 7.09 mg/dL — ABNORMAL HIGH (ref 0.61–1.24)
GFR calc Af Amer: 8 mL/min — ABNORMAL LOW (ref >60)
GFR calc non Af Amer: 7 mL/min — ABNORMAL LOW (ref >60)
Glucose, Bld: 117 mg/dL — ABNORMAL HIGH (ref 70–99)
Potassium: 6.5 mmol/L (ref 3.5–5.1)
Sodium: 137 mmol/L (ref 135–145)
Total Bilirubin: 0.9 mg/dL (ref 0.3–1.2)
Total Protein: 7.4 g/dL (ref 6.5–8.1)

## 2018-10-08 LAB — BASIC METABOLIC PANEL
Anion gap: 14 (ref 5–15)
BUN: 28 mg/dL — ABNORMAL HIGH (ref 8–23)
CO2: 24 mmol/L (ref 22–32)
Calcium: 9.2 mg/dL (ref 8.9–10.3)
Chloride: 100 mmol/L (ref 98–111)
Creatinine, Ser: 4.22 mg/dL — ABNORMAL HIGH (ref 0.61–1.24)
GFR calc Af Amer: 15 mL/min — ABNORMAL LOW (ref >60)
GFR calc non Af Amer: 13 mL/min — ABNORMAL LOW (ref >60)
Glucose, Bld: 139 mg/dL — ABNORMAL HIGH (ref 70–99)
Potassium: 4.5 mmol/L (ref 3.5–5.1)
Sodium: 138 mmol/L (ref 135–145)

## 2018-10-08 LAB — PROTIME-INR
INR: 1.5 — ABNORMAL HIGH (ref 0.8–1.2)
Prothrombin Time: 18.3 seconds — ABNORMAL HIGH (ref 11.4–15.2)

## 2018-10-08 LAB — LACTIC ACID, PLASMA
Lactic Acid, Venous: 2.7 mmol/L (ref 0.5–1.9)
Lactic Acid, Venous: 2.4 mmol/L (ref 0.5–1.9)

## 2018-10-08 LAB — HEPATITIS B SURFACE ANTIGEN: Hepatitis B Surface Ag: NEGATIVE

## 2018-10-08 MED ORDER — RENA-VITE PO TABS
1.0000 | ORAL_TABLET | Freq: Every day | ORAL | Status: DC
  Administered 2018-10-08 – 2018-10-12 (×5): 1 via ORAL
  Filled 2018-10-08 (×5): qty 1

## 2018-10-08 MED ORDER — MIDODRINE HCL 5 MG PO TABS
ORAL_TABLET | ORAL | Status: AC
  Filled 2018-10-08: qty 1

## 2018-10-08 MED ORDER — SODIUM CHLORIDE 0.9 % IV BOLUS
500.0000 mL | Freq: Once | INTRAVENOUS | Status: AC
  Administered 2018-10-08: 500 mL via INTRAVENOUS

## 2018-10-08 MED ORDER — NEPRO/CARBSTEADY PO LIQD
237.0000 mL | Freq: Two times a day (BID) | ORAL | Status: DC
  Administered 2018-10-08 – 2018-10-12 (×5): 237 mL via ORAL

## 2018-10-08 MED ORDER — BOOST / RESOURCE BREEZE PO LIQD CUSTOM
1.0000 | Freq: Three times a day (TID) | ORAL | Status: DC
  Administered 2018-10-08: 1 via ORAL

## 2018-10-08 MED ORDER — ACETAMINOPHEN 325 MG PO TABS
650.0000 mg | ORAL_TABLET | Freq: Four times a day (QID) | ORAL | Status: DC
  Administered 2018-10-08 – 2018-10-13 (×19): 650 mg via ORAL
  Filled 2018-10-08 (×19): qty 2

## 2018-10-08 MED ORDER — SODIUM CHLORIDE 0.9 % IV BOLUS
500.0000 mL | Freq: Once | INTRAVENOUS | Status: AC
  Administered 2018-10-09: 500 mL via INTRAVENOUS

## 2018-10-08 NOTE — Progress Notes (Addendum)
Notified by lab at 2136, lactic acid 2.4, notified NP Baltazar Najjar via page at 2200. BP 78/56 (64).96%SpO2. Patient resting, no distress.  Paged NP Baltazar Najjar to follow up after second 500 mL NS bolus ordered. 1st 573mL bolus NS complete at 2350.   Paged NP Baltazar Najjar at 0130 after 2nd 500 mL NS bolus: BP 70/53(60). Patient resting, no distress. Baltazar Najjar returned call at 0132 and indicated if under 85 systolic then give another 500 mL NS bolus and to use manual BP moving forward. If systolic > 85 then give nothing. Stated does not need to be notified if systolic BP > 85. Charge RN notified of verbal order.   Manual BP assessed 72/50. Charge RN at bedside to assist with assessment. Rapid RN Waunita Schooner notified; continue with orders.   Paged NP Baltazar Najjar at [phone]; patient BP 72/50 manual and notified administering 3rd 592mL NS bolus per verbal order.  Notified by lab at 0233 lactic acid of 2.4. Paged NP Baltazar Najjar with lactic acid at 0236.  Paged NP Baltazar Najjar at [phone]. Manual BP 70/52 and automatic BP 65/42. Patient no distress. BP 88/50 after 3rd 500 mL NS bolus.   Received call NP Baltazar Najjar at 039. Informed patient not symptomatic with no change to mentation. Discussed concerns about fluids given renal status (HD patient). Ok'd to give midodrine now.   No distress, not symptomatic, no change to mentation for duration of hypotension. Patient remained alert and oriented. Due to frequent assessments and BP checks patient had minimal sleep, some drowsiness, however, alert and oriented.

## 2018-10-08 NOTE — Progress Notes (Addendum)
Initial Nutrition Assessment  RD working remotely.  DOCUMENTATION CODES:   Not applicable  INTERVENTION:   -D/c Boost Breeze po TID, each supplement provides 250 kcal and 9 grams of protein -Nepro Shake po BID, each supplement provides 425 kcal and 19 grams protein -Renal MVI daily -RD will follow for diet advancement and adjust supplement regimen as tolerated  NUTRITION DIAGNOSIS:   Increased nutrient needs related to post-op healing as evidenced by estimated needs.  GOAL:   Patient will meet greater than or equal to 90% of their needs  MONITOR:   PO intake, Supplement acceptance, Diet advancement, Labs, Weight trends, Skin, I & O's  REASON FOR ASSESSMENT:   Consult, Malnutrition Screening Tool Assessment of nutrition requirement/status  ASSESSMENT:   Clayton [name] is a 73 y.o. 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  Patient denies any fever or chills denies headache shortness of breath chest pain back pain, dysuria.  He reports persistent nausea and multiple episodes of nonbloody nonbilious vomiting.  Abdominal pain is mostly around the umbilicus nonradiating and moderate to severe and constant.  Pt admitted with incarcerated umbilical hernia.   5/24- s/p Procedure:  Umbilical hernia repair with Phasix mesh 5/25- NGT pulled out 5/26- advanced to clear liquid diet  Reviewed I/O's: +408 ml x 24 hours and -217 ml since admission  Pt on HD unit. Unable to speak with pt to obtain further nutrition-related history at this time.   Per nephrology notes, dry wt: 72.5 kg. Reviewed wt hx from [place]. No recent wt available to assess acute weight changes. Pt with distant history of wt loss (2016), however, unsure  if this was before or after amputations.   Per general surgery notes, pt is starting to pass flatus. He was advanced to clear liquids from sips and chips this morning. Pt tolerating liquids well; noted meal completion 100%.   Pt with increased nutritional needs for post-operative healing and would benefit from addition of nutritional supplements.  ADDENDUM: Pt has been advanced to full liquids.  Lab Results  Component Value Date   HGBA1C 5.7 (H) 07/10/2014   PTA DM medications are 0-4 units insulin aspart every 4 hours .   Labs reviewed: K: 6.5, CBGS: 122(inpatient orders for glycemic control are ).   NUTRITION - FOCUSED PHYSICAL EXAM:    Most Recent Value  Orbital Region  Unable to assess  Upper Arm Region  Unable to assess  Thoracic and Lumbar Region  Unable to assess  Buccal Region  Unable to assess  Temple Region  Unable to assess  Clavicle Bone Region  Unable to assess  Clavicle and Acromion Bone Region  Unable to assess  Scapular Bone Region  Unable to assess  Dorsal Hand  Unable to assess  Patellar Region  Unable to assess  Anterior Thigh Region  Unable to assess  Posterior Calf Region  Unable to assess  Edema (RD Assessment)  Unable to assess  Hair  Unable to assess  Eyes  Unable to assess  Mouth  Unable to assess  Skin  Unable to assess  Nails  Unable to assess       Diet Order:   Diet Order            Diet clear liquid Room service appropriate?  Yes; Fluid consistency: Thin  Diet effective now              EDUCATION NEEDS:   No education needs have been identified at this time  Skin:  Skin Assessment: Skin Integrity Issues: Skin Integrity Issues:: Stage I, Incisions Stage I: buttocks Incisions: closed abdomen  Last BM:  10/03/18  Height:   Ht Readings from Last 1 Encounters:  10/06/18 5\' 5"  (1.651 m)    Weight:   Wt Readings from Last 1 Encounters:  10/08/18 69.7 kg    Ideal Body Weight:  53.8 kg(adjusted for bilateral BKA)  BMI:  Body  mass index is 25.57 kg/m.  Estimated Nutritional Needs:   Kcal:  1900-2100  Protein:  90-105 grams  Fluid:  1000 ml + UOP    Tray Klayman A. Jimmye Norman, RD, LDN, [place] Registered Dietitian II Certified Diabetes Care and Education Specialist Pager: [phone] After hours Pager: [phone]

## 2018-10-08 NOTE — Progress Notes (Signed)
PROGRESS NOTE    Clayton [name]  TOI:[id number] DOB: 11/01/1945 DOA: 10/06/2018 PCP: [name], PA-C (Inactive)   Brief Narrative:  Clayton [name] is Clayton [name] 73 y.o. male with medical history significant of ESRD on dialysis Monday Wednesday Friday, chronic systolic heart failure, diabetes mellitus, bilateral BKA, coronary artery disease, umbilical hernia presents today with abdominal pain for 2 to 3 weeks worsened over the last 24 hours.  Patient denies any fever or chills denies headache shortness of breath chest pain back pain, dysuria.  He reports persistent nausea and multiple episodes of nonbloody nonbilious vomiting.  Abdominal pain is mostly around the umbilicus nonradiating and moderate to severe and constant.  Admitted for incarcerated umbilical hernia with SBO, now s/p umbilical hernia repair with surgery.    Assessment & Plan:   Active Problems:   Umbilical hernia, incarcerated   Pressure injury of skin  Incarcerated umbilical hernia with small bowel obstruction S/p umbilical hernia repair with phasix mesh on 5/24 Pt high risk for ileus, NGT dislodged Clear liquid diet per surgery today OOB, IS Appreciate surgery assistance  ESRD on MWF dialysis  Hyperkalemia: Hyperkalemia to 6.5 this AM S/p dialysis on 5/26 Follow K  Appreciate nephrology recs  Persistent nausea and vomiting Symptomatic management with  IV antiemetics and IV pain meds  Hypotension: BP's Clayton [name] bit lower today in the low 90's to 80's.  Continue midodrine.  Follow repeat BP. Continue to monitor closely.  Mentating appropriately.   Chronic systolic heart failure Patient appears compensated.  Fluid management per HD  Elevated troponin: lower than previous in setting of SBO and ESRD.  No c/o c/w ACS.   Thrombocytopenia: follow  Possible Cirrhosis based on imaging: follow CMP, INR, CBC  S/p bilateral BKA   DVT prophylaxis: lovenox Code Status: full  Family Communication: none at bedside - pt  declined me calling Disposition Plan: pending surgical clearance   Consultants:   Nephrology  surgery  Procedures:  S/p umbilical hernia repair with phasix mesh on 5/24  Antimicrobials: Anti-infectives (From admission, onward)   Start     Dose/Rate Route Frequency Ordered Stop   10/07/18 0600  cefoTEtan (CEFOTAN) 2 g in sodium chloride 0.9 % 100 mL IVPB     2 g 200 mL/hr over 30 Minutes Intravenous To [name] 10/06/18 1632 10/06/18 1936     Subjective: Feels Clayton [name] bit better. No c/o nausea.   Objective: Vitals:   10/08/18 1045 10/08/18 1115 10/08/18 1130 10/08/18 1300  BP: (!) 81/37 (!) 77/42 (!) 82/52 (!) 84/66  Pulse: 62 73 73 71  Resp:   20 20  Temp:   97.9 F (36.6 C) 97.7 F (36.5 C)  TempSrc:   Oral Oral  SpO2:    96%  Weight:   69.8 kg   Height:        Intake/Output Summary (Last 24 hours) at 10/08/2018 1755 Last data filed at 10/08/2018 1300 Gross per 24 hour  Intake 408 ml  Output 0 ml  Net 408 ml   Filed Weights   10/06/18 2205 10/08/18 0800 10/08/18 1130  Weight: 71.3 kg 69.7 kg 69.8 kg    Examination:  General: No acute distress. Cardiovascular: Heart sounds show Clayton [name] regular rate, and rhythm.  Lungs: Clear to auscultation bilaterally  Abdomen: Soft, nontender, nondistended Neurological: Alert and oriented 3. Moves all extremities 4. Cranial nerves II through XII grossly intact. Skin: Warm and dry. No rashes or lesions. Extremities: bilateral BKA Psychiatric: Mood and affect are normal. Insight  and judgment are appropriate.     Data Reviewed: I have personally reviewed following labs and imaging studies  CBC: Recent Labs  Lab 10/06/18 1223 10/06/18 1926 10/07/18 0434 10/08/18 0335 10/08/18 0815  WBC 6.6  --  8.7 8.1 8.3  HGB 13.2 12.2* 12.1* 11.6* 11.5*  HCT 41.7 36.0* 38.7* 35.9* 35.9*  MCV 98.8  --  100.3* 98.9 99.2  PLT 129*  --  95* 117* 629*   Basic Metabolic Panel: Recent Labs  Lab 10/06/18 1223 10/06/18 1926  10/07/18 0434 10/08/18 0536  NA 140 136 141 137  K 4.9 5.0 5.7* 6.5*  CL 88*  --  97* 95*  CO2 32  --  29 27  GLUCOSE 120* 133* 122* 117*  BUN 29*  --  34* 55*  CREATININE 5.51*  --  5.71* 7.09*  CALCIUM 11.1*  --  9.4 9.2   GFR: Estimated Creatinine Clearance: 8.2 mL/min (Clayton [name]) (by C-G formula based on SCr of 7.09 mg/dL (H)). Liver Function Tests: Recent Labs  Lab 10/06/18 1223 10/08/18 0536  AST 13* 10*  ALT 11 8  ALKPHOS 147* 108  BILITOT 1.4* 0.9  PROT 9.1* 7.4  ALBUMIN 3.8 3.0*   Recent Labs  Lab 10/06/18 1223  LIPASE 30   No results for input(s): AMMONIA in the last 168 hours. Coagulation Profile: Recent Labs  Lab 10/08/18 0335  INR 1.5*   Cardiac Enzymes: Recent Labs  Lab 10/06/18 1223  TROPONINI 0.08*   BNP (last 3 results) No results for input(s): PROBNP in the last 8760 hours. HbA1C: No results for input(s): HGBA1C in the last 72 hours. CBG: Recent Labs  Lab 10/06/18 2052  GLUCAP 122*   Lipid Profile: No results for input(s): CHOL, HDL, LDLCALC, TRIG, CHOLHDL, LDLDIRECT in the last 72 hours. Thyroid Function Tests: No results for input(s): TSH, T4TOTAL, FREET4, T3FREE, THYROIDAB in the last 72 hours. Anemia Panel: No results for input(s): VITAMINB12, FOLATE, FERRITIN, TIBC, IRON, RETICCTPCT in the last 72 hours. Sepsis Labs: Recent Labs  Lab 10/06/18 1223  LATICACIDVEN 2.3*    Recent Results (from the past 240 hour(s))  SARS Coronavirus 2 (CEPHEID - Performed in [name]), Hosp Order     Status: None   Collection Time: 10/06/18 12:23 PM  Result Value Ref Range Status   SARS Coronavirus 2 NEGATIVE NEGATIVE Final    Comment: (NOTE) If result is NEGATIVE SARS-CoV-2 target nucleic acids are NOT DETECTED. The SARS-CoV-2 RNA is generally detectable in upper and lower  respiratory specimens during the acute phase of infection. The lowest  concentration of SARS-CoV-2 viral copies this assay can detect is 250  copies / mL. Clayton [name]  negative result does not preclude SARS-CoV-2 infection  and should not be used as the sole basis for treatment or other  patient management decisions.  Clayton [name] negative result may occur with  improper specimen collection / handling, submission of specimen other  than nasopharyngeal swab, presence of viral mutation(s) within the  areas targeted by this assay, and inadequate number of viral copies  (<250 copies / mL). Arie Gable negative result must be combined with clinical  observations, patient history, and epidemiological information. If result is POSITIVE SARS-CoV-2 target nucleic acids are DETECTED. The SARS-CoV-2 RNA is generally detectable in upper and lower  respiratory specimens dur ing the acute phase of infection.  Positive  results are indicative of active infection with SARS-CoV-2.  Clinical  correlation with patient history and other diagnostic information is  necessary to  determine patient infection status.  Positive results do  not rule out bacterial infection or co-infection with other viruses. If result is PRESUMPTIVE POSTIVE SARS-CoV-2 nucleic acids MAY BE PRESENT.   Ercole Georg presumptive positive result was obtained on the submitted specimen  and confirmed on repeat testing.  While 2019 novel coronavirus  (SARS-CoV-2) nucleic acids may be present in the submitted sample  additional confirmatory testing may be necessary for epidemiological  and / or clinical management purposes  to differentiate between  SARS-CoV-2 and other Sarbecovirus currently known to infect humans.  If clinically indicated additional testing with an alternate test  methodology [phone]) is advised. The SARS-CoV-2 RNA is generally  detectable in upper and lower respiratory sp ecimens during the acute  phase of infection. The expected result is Negative. Fact Sheet for Patients:  StrictlyIdeas.no Fact Sheet for Healthcare Providers: BankingDealers.co.za This test is not  yet approved or cleared by the [place] FDA and has been authorized for detection and/or diagnosis of SARS-CoV-2 by FDA under an Emergency Use Authorization (EUA).  This EUA will remain in effect (meaning this test can be used) for the duration of the COVID-19 declaration under Section 564(b)(1) of the Act, 21 U.S.C. section 360bbb-3(b)(1), unless the authorization is terminated or revoked sooner. Performed at [facility], [place] [address] [place], [place] [address]          Radiology Studies: No results found.      Scheduled Meds: . acetaminophen  650 mg Oral Q6H  . Chlorhexidine Gluconate Cloth  6 each Topical Q0600  . enoxaparin (LOVENOX) injection  30 mg Subcutaneous Q24H  . feeding supplement (NEPRO CARB STEADY)  237 mL Oral BID BM  . midodrine      . midodrine  10 mg Oral TID WC  . multivitamin  1 tablet Oral QHS   Continuous Infusions:   LOS: 2 days    Time spent: over 30 min    Fayrene Helper, MD [facility] Pager AMION  If 7PM-7AM, please contact night-coverage [email or web address] Password [facility] 10/08/2018, 5:55 PM

## 2018-10-08 NOTE — Progress Notes (Signed)
[facility] Progress Note  2 Days Post-Op  Subjective: CC: incisional pain Patient complaining of pain at incision site. Passing flatus, no BM yet. Denies nausea. Tolerating ice chips. Seen in HD this AM.   Objective: Vital signs in last 24 hours: Temp:  [97.7 F (36.5 C)-98.6 F (37 C)] 97.9 F (36.6 C) (05/26 0500) Pulse Rate:  [70] 70 (05/26 0500) Resp:  [15-17] 15 (05/26 0500) BP: (80-102)/(52-63) 80/54 (05/26 0500) SpO2:  [94 %-100 %] 94 % (05/26 0500) Last BM Date: 10/03/18  Intake/Output from previous day: 05/25 0701 - 05/26 0700 In: 408 [P.O.:408] Out: -  Intake/Output this shift: No intake/output data recorded.  PE: Gen:  Alert, NAD, pleasant Card:  Regular rate and rhythm Pulm:  Normal effort, clear to auscultation bilaterally Abd: Soft, appropriately tender, non-distended, +BS, dressing at umbilicus c/d/i Skin: warm and dry, no rashes  Psych: A&Ox3   Lab Results:  Recent Labs    10/07/18 0434 10/08/18 0335  WBC 8.7 8.1  HGB 12.1* 11.6*  HCT 38.7* 35.9*  PLT 95* 117*   BMET Recent Labs    10/07/18 0434 10/08/18 0536  NA 141 137  K 5.7* 6.5*  CL 97* 95*  CO2 29 27  GLUCOSE 122* 117*  BUN 34* 55*  CREATININE 5.71* 7.09*  CALCIUM 9.4 9.2   PT/INR Recent Labs    10/08/18 0335  LABPROT 18.3*  INR 1.5*   CMP     Component Value Date/Time   NA 137 10/08/2018 0536   NA 137 11/26/2012 1131   K 6.5 (HH) 10/08/2018 0536   K 3.5 11/26/2012 1131   CL 95 (L) 10/08/2018 0536   CL 100 11/26/2012 1131   CO2 27 10/08/2018 0536   CO2 29 11/26/2012 1131   GLUCOSE 117 (H) 10/08/2018 0536   GLUCOSE 90 11/26/2012 1131   BUN 55 (H) 10/08/2018 0536   BUN 36 (H) 11/26/2012 1131   CREATININE 7.09 (H) 10/08/2018 0536   CREATININE 5.93 (H) 11/26/2012 1131   CALCIUM 9.2 10/08/2018 0536   CALCIUM 9.4 11/26/2012 1131   PROT 7.4 10/08/2018 0536   PROT 7.5 11/26/2012 1131   ALBUMIN 3.0 (L) 10/08/2018 0536   ALBUMIN 2.2 (L) 11/26/2012 1131    AST 10 (L) 10/08/2018 0536   AST 15 11/26/2012 1131   ALT 8 10/08/2018 0536   ALT 11 (L) 11/26/2012 1131   ALKPHOS 108 10/08/2018 0536   ALKPHOS 228 (H) 11/26/2012 1131   BILITOT 0.9 10/08/2018 0536   BILITOT 0.9 11/26/2012 1131   GFRNONAA 7 (L) 10/08/2018 0536   GFRNONAA 9 (L) 11/26/2012 1131   GFRAA 8 (L) 10/08/2018 0536   GFRAA 11 (L) 11/26/2012 1131   Lipase     Component Value Date/Time   LIPASE 30 10/06/2018 1223       Studies/Results: Ct Abdomen Pelvis W Contrast  Result Date: 10/06/2018 CLINICAL DATA:  Incarcerated hernia EXAM: CT ABDOMEN AND PELVIS WITH CONTRAST TECHNIQUE: Multidetector CT imaging of the abdomen and pelvis was performed using the standard protocol following bolus administration of intravenous contrast. CONTRAST:  175mL OMNIPAQUE IOHEXOL 300 MG/ML  SOLN COMPARISON:  None. FINDINGS: Lower chest: Small left pleural effusion. Bibasilar atelectasis or consolidation. Cardiomegaly Hepatobiliary: Shrunken and somewhat coarse appearing liver. Small gallstones. No gallbladder wall thickening, or biliary dilatation. Pancreas: Unremarkable. No pancreatic ductal dilatation or surrounding inflammatory changes. Spleen: Normal in size without focal abnormality. Adrenals/Urinary Tract: Adrenal glands are unremarkable. Very atrophic kidneys. Bladder is unremarkable. Stomach/Bowel: The stomach  is grossly fluid distended. The proximal small bowel is mildly distended, largest loops measuring 3.9 cm. There is a narrow necked umbilical hernia containing a single loop of mid small bowel and fluid, with decompression of the distal small bowel and colon. Vascular/Lymphatic: Mixed calcific atherosclerosis. No enlarged abdominal or pelvic lymph nodes. Reproductive: Prostatomegaly. Other: No abdominal wall hernia or abnormality. Large volume ascites. Musculoskeletal: Renal osteodystrophy. IMPRESSION: 1. The stomach is grossly fluid distended. The proximal small bowel is mildly distended,  largest loops measuring 3.9 cm. There is a narrow necked umbilical hernia containing a single loop of mid small bowel and fluid, with decompression of the distal small bowel and colon. Findings are concerning for obstruction at the level of an incarcerated hernia. 2.  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 3.  Other chronic and incidental findings as detailed above. Electronically Signed   By: Eddie Candle M.D.   On: 10/06/2018 15:34   Dg Chest Port 1 View  Result Date: 10/06/2018 CLINICAL DATA:  Pt states mid chest pains with nANDv x 5 days, hx pacemaker/defib, diabetes, non smoker EXAM: PORTABLE CHEST 1 VIEW COMPARISON:  Chest x-rays dated 07/07/2014 and 06/30/2014. FINDINGS: Stable cardiomegaly. LEFT chest wall pacemaker/ICD leads appear stable in position. Lungs are clear. No pleural effusion or pneumothorax seen. Osseous structures about the chest are unremarkable. IMPRESSION: No active disease. No evidence of pneumonia or pulmonary edema. Stable cardiomegaly. Electronically Signed   By: Franki Cabot M.D.   On: 10/06/2018 12:59    Anti-infectives: Anti-infectives (From admission, onward)   Start     Dose/Rate Route Frequency Ordered Stop   10/07/18 0600  cefoTEtan (CEFOTAN) 2 g in sodium chloride 0.9 % 100 mL IVPB     2 g 200 mL/hr over 30 Minutes Intravenous To [facility] 10/06/18 1632 10/06/18 1936       Assessment/Plan DM CAD CHF ESRD on HD (M/W/F)  Hypotension - fluid management per primary team and nephrology  Hyperkalemia - K 6.5 this AM, management per primary and renal  Incarcerated recurrent umbilical hernia S/p Umbilical hernia repair with Phasixmesh - Dr. Donnie Mesa - 10/06/2018 - POD #2 - High risk for ileus but starting to pass flatus, no NGT - start CLD today and monitor - mobilize as able - IS  FEN - CLD VTE - Lovenox (prophlactic-renal dosing) ID - Cefotetan peri-op  LOS: 2 days    Brigid Re , [facility]  Surgery 10/08/2018, 7:43 AM Pager: [phone] Consults: [phone]

## 2018-10-08 NOTE — Progress Notes (Addendum)
BP 72/51. Pt asymptomatic. Florene Glen, MD paged and to order 500cc NS bolus. Will continue to monitor.

## 2018-10-08 NOTE — Progress Notes (Signed)
[facility] Progress Note  Subjective: no c/o  Vitals:   10/08/18 1045 10/08/18 1115 10/08/18 1130 10/08/18 1300  BP: (!) 81/37 (!) 77/42 (!) 82/52 (!) 84/66  Pulse: 62 73 73 71  Resp:   20 20  Temp:   97.9 F (36.6 C) 97.7 F (36.5 C)  TempSrc:   Oral Oral  SpO2:    96%  Weight:   69.8 kg   Height:        Inpatient medications: . acetaminophen  650 mg Oral Q6H  . Chlorhexidine Gluconate Cloth  6 each Topical Q0600  . enoxaparin (LOVENOX) injection  30 mg Subcutaneous Q24H  . feeding supplement (NEPRO CARB STEADY)  237 mL Oral BID BM  . midodrine      . midodrine  10 mg Oral TID WC  . multivitamin  1 tablet Oral QHS    diphenhydrAMINE **OR** diphenhydrAMINE, HYDROmorphone (DILAUDID) injection, ondansetron **OR** ondansetron (ZOFRAN) IV, oxyCODONE    Exam: Gen alert, chron ill and pleasant No jvd or bruits Chest clear bilat no rales RRR no MRG Abd soft ntnd no mass or ascites +bs GU normal male MS bilat BKA Ext no LE edema, no wounds or ulcers Neuro is alert, Ox 3 , nf L FA AVF +bruit   Home meds:  - midodrine 10 tid  - insluin aspart ac ssi    MWF [place]  4h  72.5kg   Heparin none  L AVF   Assessment: 1. Incarb umbilical hernia w/ SBO - sp OR repair 5/24 2. ESRD - on HD MWF. Had HD today w/ no fluid off. Keep on TTS this week to help w/ staffing. Next HD Thursday.  3. Chronic hypotension - on midodrine, usual BP's 80's - 100's, drops into 70's on HD regularly 4. Volume - no vol^ on exam, under dry wt 5. DM2 on insulin 6. Hx CHF/ AICD implant 7. Anemia ckd - Hb 10- 12 here, no need esa 8. Hyperkalemia - K 6.5 pre HD, had dialysis today off sched. K+ repeat in am      [facility] 10/08/2018, 3:17 PM  Iron/TIBC/Ferritin/ %Sat No results found for: IRON, TIBC, FERRITIN, IRONPCTSAT Recent Labs  Lab 10/08/18 0335 10/08/18 0536  NA  --  137  K  --  6.5*  CL  --  95*  CO2  --  27  GLUCOSE  --  117*   BUN  --  55*  CREATININE  --  7.09*  CALCIUM  --  9.2  ALBUMIN  --  3.0*  INR 1.5*  --    Recent Labs  Lab 10/08/18 0536  AST 10*  ALT 8  ALKPHOS 108  BILITOT 0.9  PROT 7.4   Recent Labs  Lab 10/08/18 0815  WBC 8.3  HGB 11.5*  HCT 35.9*  PLT 125*

## 2018-10-08 NOTE — Progress Notes (Signed)
Pt returned to 6N27 via bed from dialysis. Received report from Carla Drape, RN in HD. See assessment. Will continue to monitor.

## 2018-10-08 NOTE — Plan of Care (Signed)
  Problem: Education: Goal: Knowledge of General Education information will improve Description: Including pain rating scale, medication(s)/side effects and non-pharmacologic comfort measures Outcome: Progressing   Problem: Health Behavior/Discharge Planning: Goal: Ability to manage health-related needs will improve Outcome: Progressing   Problem: Clinical Measurements: Goal: Ability to maintain clinical measurements within normal limits will improve Outcome: Progressing Goal: Will remain free from infection Outcome: Progressing   Problem: Nutrition: Goal: Adequate nutrition will be maintained Outcome: Progressing   Problem: Coping: Goal: Level of anxiety will decrease Outcome: Progressing   Problem: Pain Managment: Goal: General experience of comfort will improve Outcome: Progressing   Problem: Safety: Goal: Ability to remain free from injury will improve Outcome: Progressing   Problem: Skin Integrity: Goal: Risk for impaired skin integrity will decrease Outcome: Progressing   

## 2018-10-08 NOTE — Progress Notes (Signed)
Attempted to call report x3. Nurse unable to take report. Pt transferred to 5W06 via bed by this RN and Thamas Jaegers, RN. Personal wheelchair and pt belongings transported with pt. Bedside report given to Dannon, RN. All questions answered to satisfaction.

## 2018-10-08 NOTE — Progress Notes (Signed)
Critical potassium 6.5.  Patient did missed dialysis yesterday.  BP 80/50 which is normal for the patient.  Patient is asymptomatic.  Dialysis notified of critical values, and said she will schedule him first and advised to given midodrine 10mg  now. [facility] on call notified with a text page.  Monitoring patient closely.  Dialysis coming for patient soon.

## 2018-10-09 DIAGNOSIS — K56609 Unspecified intestinal obstruction, unspecified as to partial versus complete obstruction: Secondary | ICD-10-CM

## 2018-10-09 LAB — COMPREHENSIVE METABOLIC PANEL
ALT: 8 U/L (ref 0–44)
AST: 10 U/L — ABNORMAL LOW (ref 15–41)
Albumin: 2.6 g/dL — ABNORMAL LOW (ref 3.5–5.0)
Alkaline Phosphatase: 95 U/L (ref 38–126)
Anion gap: 15 (ref 5–15)
BUN: 27 mg/dL — ABNORMAL HIGH (ref 8–23)
CO2: 23 mmol/L (ref 22–32)
Calcium: 9.1 mg/dL (ref 8.9–10.3)
Chloride: 100 mmol/L (ref 98–111)
Creatinine, Ser: 4.22 mg/dL — ABNORMAL HIGH (ref 0.61–1.24)
GFR calc Af Amer: 15 mL/min — ABNORMAL LOW (ref >60)
GFR calc non Af Amer: 13 mL/min — ABNORMAL LOW (ref >60)
Glucose, Bld: 115 mg/dL — ABNORMAL HIGH (ref 70–99)
Potassium: 4.3 mmol/L (ref 3.5–5.1)
Sodium: 138 mmol/L (ref 135–145)
Total Bilirubin: 0.6 mg/dL (ref 0.3–1.2)
Total Protein: 6.6 g/dL (ref 6.5–8.1)

## 2018-10-09 LAB — HEPATITIS B SURFACE ANTIBODY,QUALITATIVE: Hep B S Ab: REACTIVE

## 2018-10-09 LAB — CBC
HCT: 33.6 % — ABNORMAL LOW (ref 39.0–52.0)
Hemoglobin: 10.8 g/dL — ABNORMAL LOW (ref 13.0–17.0)
MCH: 32 pg (ref 26.0–34.0)
MCHC: 32.1 g/dL (ref 30.0–36.0)
MCV: 99.7 fL (ref 80.0–100.0)
Platelets: 108 10*3/uL — ABNORMAL LOW (ref 150–400)
RBC: 3.37 MIL/uL — ABNORMAL LOW (ref 4.22–5.81)
RDW: 15.7 % — ABNORMAL HIGH (ref 11.5–15.5)
WBC: 6.8 10*3/uL (ref 4.0–10.5)
nRBC: 0.3 % — ABNORMAL HIGH (ref 0.0–0.2)

## 2018-10-09 LAB — PROTIME-INR
INR: 1.6 — ABNORMAL HIGH (ref 0.8–1.2)
Prothrombin Time: 18.4 seconds — ABNORMAL HIGH (ref 11.4–15.2)

## 2018-10-09 LAB — LACTIC ACID, PLASMA: Lactic Acid, Venous: 2.4 mmol/L (ref 0.5–1.9)

## 2018-10-09 LAB — HEPATITIS B CORE ANTIBODY, TOTAL: Hep B Core Total Ab: NEGATIVE

## 2018-10-09 LAB — MAGNESIUM: Magnesium: 1.9 mg/dL (ref 1.7–2.4)

## 2018-10-09 MED ORDER — SODIUM CHLORIDE 0.9 % IV BOLUS: 1000.0000 mL | Freq: Once | INTRAVENOUS | Status: DC

## 2018-10-09 MED ORDER — SODIUM CHLORIDE 0.9 % IV BOLUS
250.0000 mL | Freq: Once | INTRAVENOUS | Status: DC
  Administered 2018-10-09: 07:00:00 250 mL via INTRAVENOUS

## 2018-10-09 MED ORDER — SODIUM CHLORIDE 0.9 % IV BOLUS
500.0000 mL | Freq: Once | INTRAVENOUS | Status: AC
  Administered 2018-10-09: 500 mL via INTRAVENOUS

## 2018-10-09 MED ORDER — CHLORHEXIDINE GLUCONATE CLOTH 2 % EX PADS
6.0000 | MEDICATED_PAD | Freq: Every day | CUTANEOUS | Status: DC
  Administered 2018-10-10 – 2018-10-11 (×2): 6 via TOPICAL

## 2018-10-09 NOTE — Progress Notes (Signed)
Clayton [name] is a 73 y.o. male patient admitted from ED awake, alert - oriented  X 4 - no acute distress noted.  VSS - Blood pressure (!) 71/46, pulse 69, temperature 98.1 F (36.7 C), temperature source Oral, resp. rate (!) 28, height 5\' 5"  (1.651 m), weight 69.8 kg, SpO2 95 %.    IV in place, occlusive dsg intact without redness.  Orientation to room, and floor completed with information packet given to patient/family.  Patient declined safety video at this time.  Admission INP armband ID verified with patient/family, and in place.   SR up x 2, fall assessment complete, with patient and family able to verbalize understanding of risk associated with falls, and verbalized understanding to call nsg before up out of bed.    Call light within reach, patient able to voice, and demonstrate understanding. Skin to skin assessment completed with second RN. Stage 1 with foam dressing observed and documented.   Skin, clean-dry- intact without evidence of bruising, or skin tears.    Will cont to eval and treat per MD orders.  Howard Pouch, RN 10/09/2018 1:24 AM

## 2018-10-09 NOTE — Progress Notes (Addendum)
2956: Manual BP 76/52. Message sent to MD. Patient slightly drowsy.  0840: MD aware of hypotension. States to monitor patient, patient remains asymptomatic.  1459: Patient found in room with incorrect tray. Contain macaroni and cheese, steak, mashed potatoes and gravy, and soda. Patient consumed half of tray. Physician made aware. Patient to stay on full liquid diet until he has BM.

## 2018-10-09 NOTE — Plan of Care (Signed)
  Problem: Elimination: Goal: Will not experience complications related to urinary retention Outcome: Not Progressing  Patient is oliguric, patient on dialysis.

## 2018-10-09 NOTE — TOC Initial Note (Signed)
Transition of Care [facility]) - Initial/Assessment Note    Patient Details  Name: Clayton [name] MRN: [id number] Date of Birth: Feb 19, 1946  Transition of Care [facility]) CM/SW Contact:    Sharin Mons, RN Phone Number: 10/09/2018, 10:48 AM  Clinical Narrative:    Admitted with umbilical hernia/ incarcerated.   From home alone. States receives home health services from [place] ( Therapist, sports,  Nurse aide/ [employer]). Nurse aide services M-F, 7a-10a and 6p-8p. States has support from brother also who lives in [place].       Clayton [name] (Daughter) Clayton [name] (Spouse)    [phone]     Brother to provide transportation to home. States no problems affording Rx meds.  Expected Discharge Plan: [facility](lives alone) Barriers to Discharge: Continued Medical Work up   Patient Goals and CMS Choice Patient states their goals for this hospitalization and ongoing recovery are:: to get out as soon as i can CMS Medicare.gov Compare Post Acute Care list provided to:: Patient Choice offered to / list presented to : Patient  Expected Discharge Plan and Services Expected Discharge Plan: [place] [facility](lives alone) In-house Referral: NA Discharge Planning Services: CM Consult Post Acute Care Choice: [place] arrangements for the past 2 months: [place]: RN, Nurse's Aide HH Agency: [place] Date [name] Agency Contacted: 10/09/18 Time HH Agency Contacted: 52 Representative spoke with at [place]: Adela Lank  Prior Living Arrangements/Services Living arrangements for the past 2 months: [place] with:: Self Patient language and need for interpreter reviewed:: Yes Do you feel safe going back to the place where you live?: Yes      Need for Family Participation in Patient Care: No (Comment) Care giver support system in place?: Yes (comment) Current home services: Homehealth  aide, Home RN Criminal Activity/Legal Involvement Pertinent to Current Situation/Hospitalization: No - Comment as needed  Activities of Daily Living Home Assistive Devices/Equipment: Wheelchair ADL Screening (condition at time of admission) Patient's cognitive ability adequate to safely complete daily activities?: No Is the patient deaf or have difficulty hearing?: No Does the patient have difficulty seeing, even when wearing glasses/contacts?: No Does the patient have difficulty concentrating, remembering, or making decisions?: No Patient able to express need for assistance with ADLs?: Yes Does the patient have difficulty dressing or bathing?: Yes Independently performs ADLs?: Yes (appropriate for developmental age) Does the patient have difficulty walking or climbing stairs?: Yes Weakness of Legs: Both(bilateral amputee) Weakness of Arms/Hands: None  Permission Sought/Granted Permission sought to share information with : Case Manager Permission granted to share information with : Yes, Verbal Permission Granted  Share Information with NAME: Clayton [name] (Daughter)[name] (Spouse)           Emotional Assessment Appearance:: Appears younger than stated age Attitude/Demeanor/Rapport: Engaged Affect (typically observed): Accepting Orientation: : Oriented to Self, Oriented to Place, Oriented to  Time, Oriented to Situation Alcohol / Substance Use: Not Applicable Psych Involvement: No (comment)  Admission diagnosis:  Umbilical hernia, incarcerated [K42.0] Chest pain [R07.9] Patient Active Problem List   Diagnosis Date Noted  . Pressure injury of skin 10/08/2018  . Umbilical hernia, incarcerated 10/06/2018  . Syncope and collapse   . Acute on chronic diastolic CHF (congestive heart failure) ([place])   . End stage renal disease on  dialysis ([place])   . Nonsustained ventricular tachycardia ([place])   . Diabetes type 2, controlled ([place])   . Abdominal distension   . Malnutrition of  moderate degree ([place]) 07/02/2014  . C. difficile colitis   . Palliative care encounter   . Acute encephalopathy   . Cardiogenic shock ([place])   . Severe sepsis with septic shock ([place]) 06/29/2014  . ESRD (end stage renal disease) on dialysis ([place]) 06/29/2014  . DM type 2, uncontrolled, with renal complications ([place]) 31/54/0086  . DM (diabetes mellitus), type 2 with peripheral vascular complications ([place]) 76/19/5093  . Peripheral vascular disease due to secondary diabetes mellitus ([place]) 06/29/2014  . Chronic systolic CHF (congestive heart failure), NYHA class 3 ([place])   . Coronary artery disease   . S/P bilateral BKA (below knee amputation) ([place])    PCP:  [name], PA-C (Inactive) Pharmacy:   [facility] [place] - [address] [place] [address] Phone: [phone] Fax: [phone]     Social Determinants of Health (SDOH) Interventions    Readmission Risk Interventions No flowsheet data found.

## 2018-10-09 NOTE — Progress Notes (Signed)
Patient ID: Clayton [name], male   DOB: 1945/08/31, 73 y.o.   MRN: [id number]    3 Days Post-Op  Subjective: No new complaints today.  No flatus or BMs.  Tolerating clear liquids.  Objective: Vital signs in last 24 hours: Temp:  [97.7 F (36.5 C)-98.1 F (36.7 C)] 97.8 F (36.6 C) (05/27 0355) Pulse Rate:  [31-75] 71 (05/27 0826) Resp:  [16-28] 21 (05/27 0826) BP: (65-91)/(32-66) 76/52 (05/27 0826) SpO2:  [90 %-99 %] 90 % (05/27 0826) Weight:  [69.8 kg-74.6 kg] 74.6 kg (05/27 0329) Last BM Date: 10/03/18  Intake/Output from previous day: 05/26 0701 - 05/27 0700 In: 360 [P.O.:360] Out: 0  Intake/Output this shift: No intake/output data recorded.  PE: Abd: soft, appropriately tender, incision is covered with gauze and tegaderm.  Gauze is clean and dry.  +BS  Lab Results:  Recent Labs    10/08/18 0815 10/09/18 0156  WBC 8.3 6.8  HGB 11.5* 10.8*  HCT 35.9* 33.6*  PLT 125* 108*   BMET Recent Labs    10/08/18 2108 10/09/18 0156  NA 138 138  K 4.5 4.3  CL 100 100  CO2 24 23  GLUCOSE 139* 115*  BUN 28* 27*  CREATININE 4.22* 4.22*  CALCIUM 9.2 9.1   PT/INR Recent Labs    10/08/18 0335 10/09/18 0156  LABPROT 18.3* 18.4*  INR 1.5* 1.6*   CMP     Component Value Date/Time   NA 138 10/09/2018 0156   NA 137 11/26/2012 1131   K 4.3 10/09/2018 0156   K 3.5 11/26/2012 1131   CL 100 10/09/2018 0156   CL 100 11/26/2012 1131   CO2 23 10/09/2018 0156   CO2 29 11/26/2012 1131   GLUCOSE 115 (H) 10/09/2018 0156   GLUCOSE 90 11/26/2012 1131   BUN 27 (H) 10/09/2018 0156   BUN 36 (H) 11/26/2012 1131   CREATININE 4.22 (H) 10/09/2018 0156   CREATININE 5.93 (H) 11/26/2012 1131   CALCIUM 9.1 10/09/2018 0156   CALCIUM 9.4 11/26/2012 1131   PROT 6.6 10/09/2018 0156   PROT 7.5 11/26/2012 1131   ALBUMIN 2.6 (L) 10/09/2018 0156   ALBUMIN 2.2 (L) 11/26/2012 1131   AST 10 (L) 10/09/2018 0156   AST 15 11/26/2012 1131   ALT 8 10/09/2018 0156   ALT 11 (L) 11/26/2012  1131   ALKPHOS 95 10/09/2018 0156   ALKPHOS 228 (H) 11/26/2012 1131   BILITOT 0.6 10/09/2018 0156   BILITOT 0.9 11/26/2012 1131   GFRNONAA 13 (L) 10/09/2018 0156   GFRNONAA 9 (L) 11/26/2012 1131   GFRAA 15 (L) 10/09/2018 0156   GFRAA 11 (L) 11/26/2012 1131   Lipase     Component Value Date/Time   LIPASE 30 10/06/2018 1223       Studies/Results: No results found.  Anti-infectives: Anti-infectives (From admission, onward)   Start     Dose/Rate Route Frequency Ordered Stop   10/07/18 0600  cefoTEtan (CEFOTAN) 2 g in sodium chloride 0.9 % 100 mL IVPB     2 g 200 mL/hr over 30 Minutes Intravenous To [facility] 10/06/18 1632 10/06/18 1936       Assessment/Plan DM CAD CHF ESRD on HD (M/W/F)  Hypotension - fluid management per primary team and nephrology Hyperkalemia - resolved after HD  Incarcerated recurrent umbilical hernia S/pUmbilical hernia repair with Phasixmesh- Dr. Donnie Mesa - 10/06/2018 - POD #3 - no bowel function yet, but tolerating clears.  Will leave on clears til he has bowel function -  mobilize as able, PT consult to get him out of bed at least to a chair as he does not have prostheses  - IS  FEN -CLD VTE -Lovenox (prophlactic-renal dosing) ID -Cefotetan peri-op   LOS: 3 days    Henreitta Cea , [facility] 10/09/2018, 8:32 AM Pager: [phone]

## 2018-10-09 NOTE — Progress Notes (Signed)
[facility] Progress Note  Subjective: no c/o, BP's were lower overnight into 50's and 60's.  This am BP 70's and pt w/o any complaitns, talking on thephone.  K+ down to 4.3.   Vitals:   10/09/18 1046 10/09/18 1051 10/09/18 1107 10/09/18 1151  BP: (!) 79/48 (!) 71/38 (!) 64/41 99/72  Pulse: 64 73 69 85  Resp: (!) 23 20 17 14   Temp:  (!) 97.3 F (36.3 C)    TempSrc:  Oral    SpO2: 95% 91% 95% 97%  Weight:      Height:        Inpatient medications: . acetaminophen  650 mg Oral Q6H  . Chlorhexidine Gluconate Cloth  6 each Topical Q0600  . enoxaparin (LOVENOX) injection  30 mg Subcutaneous Q24H  . feeding supplement (NEPRO CARB STEADY)  237 mL Oral BID BM  . midodrine  10 mg Oral TID WC  . multivitamin  1 tablet Oral QHS    diphenhydrAMINE **OR** diphenhydrAMINE, ondansetron **OR** ondansetron (ZOFRAN) IV, oxyCODONE    Exam: Gen alert chron ill appearing and pleasant No jvd or bruits Chest clear bilat no rales RRR no MRG Abd soft ntnd no mass or ascites +bs MS bilat BKA Ext no LE edema Neuro is alert, Ox 3 , nf L FA AVF +bruit   Home meds:  - midodrine 10 tid  - insluin aspart ac ssi    MWF [facility]  4h  72.5kg   Heparin none  L AVF   Assessment: 1. Incarb umbilical hernia w/ SBO - sp OR repair 5/24. No BM yet, on CL's, mobilizing 2. ESRD - on HD MWF. Had HD Tuesday, will keep on TTS this week to help w/ staffing 3. Chronic hypotension - on midodrine, usual BP's 80's - 100's, drops into 60's - 70's on HD per patient. "It's been like this many years".  4. Volume - no edema, +2kg today 5. DM2 on insulin 6. Hx CHF/ AICD implant 7. Anemia ckd - Hb 10- 12 here, no need esa 8. Hyperkalemia - resolved      [facility] 10/09/2018, 1:15 PM  Iron/TIBC/Ferritin/ %Sat No results found for: IRON, TIBC, FERRITIN, IRONPCTSAT Recent Labs  Lab 10/09/18 0156  NA 138  K 4.3  CL 100  CO2 23  GLUCOSE 115*  BUN 27*   CREATININE 4.22*  CALCIUM 9.1  ALBUMIN 2.6*  INR 1.6*   Recent Labs  Lab 10/09/18 0156  AST 10*  ALT 8  ALKPHOS 95  BILITOT 0.6  PROT 6.6   Recent Labs  Lab 10/09/18 0156  WBC 6.8  HGB 10.8*  HCT 33.6*  PLT 108*

## 2018-10-09 NOTE — Progress Notes (Signed)
PROGRESS NOTE    Clayton [name]  XVQ:[id number] DOB: [birth year] DOA: 10/06/2018 PCP: [name], PA-C (Inactive)   Brief Narrative:   Clayton [name] [place] a 73 y.o.malewith medical history significant ofESRD on dialysis Monday Wednesday Friday, chronic systolic heart failure, diabetes mellitus, bilateral BKA, coronary artery disease, umbilical hernia presents today with abdominal pain for 2 to 3 weeks worsened over the last 24 hours. Patient denies any fever or chills denies headache shortness of breath chest pain back pain, dysuria. He reports persistent nausea and multiple episodes of nonbloody nonbilious vomiting. Abdominal pain is mostly around the umbilicus nonradiating and moderate to severe and constant.  Admitted for incarcerated umbilical hernia with SBO, now s/p umbilical hernia repair with surgery.     Assessment & Plan:   Active Problems:   Umbilical hernia, incarcerated   Pressure injury of skin   Incarcerated umbilical hernia with small bowel obstruction Patient presenting with progressive abdominal discomfort for 2-3 weeks with acute worsening over the past 24 hours prior to hospitalization.  CT abdomen/pelvis on admission notable for proximal small bowel distention with narrow necked umbilical hernia containing a single loop of small bowel in fluid with decompression of the distal small bowel and colon concerning for obstruction. --General surgery following, appreciate assistance --S/p umbilical hernia repair with phasix mesh on 5/24 --Continues with flatus, but no bowel movement --Continue clear liquid diet per general surgery --PT for mobilization/out of bed to chair at least twice daily  ESRD on MWF dialysis  Hyperkalemia: --Nephrology following, appreciate assistance --Potassium improved from 6.5 to 4.3 today following HD on 5/26 --Continue to follow potassium daily --HD per nephrology recommendations  Persistent nausea and vomiting: resolved  --Continue antiemetics as needed  Chronic hypotension:  Systolic blood pressures in the upper 70s overnight.  Status post 500 mL IV fluid bolus.  Asymptomatic --Continue midodrine 10 mg p.o. 3 times daily --Continue to monitor blood pressure closely  Chronic systolic heart failure Patient appears compensated. Fluid management per HD  Elevated troponin: Lower than previous in setting of SBO and ESRD.  No c/o c/w ACS.   Possible cirrhosis noted on imaging Thrombocytopenia CT abdomen/pelvis with hepatic morphology suggestive of cirrhosis with ascites. LFTs within normal limits.  INR elevated to 1.6.  Platelets 108. --Avoid hepatotoxins --Will need further outpatient follow-up/surveillance  Possible Cirrhosis based on imaging: follow CMP, INR, CBC  S/p bilateral BKA  Patient reports currently lives at home alone with the assistance of home health aides.  PT recommends home health PT when ready for discharge.   DVT prophylaxis: Lovenox Code Status: Full code Family Communication: None - patient declined calling family Disposition Plan: Continue inpatient until bowel function returns, PT recommends home health on discharge.   Consultants:   General surgery  Nephrology  Procedures: umbilical hernia repair with phasix mesh on 5/24  Antimicrobials:   Perioperative cefotetan   Subjective: Patient resting comfortably in bed, no complaints this morning.  Continues with flatus, but denies any bowel movements.  Currently tolerating clear liquid diet without nausea/vomiting.  Nursing concerned overnight regarding hypotension, but this is chronic for him and patient denies any concerning symptoms such as dizziness/lightheadedness.  Patient without any further complaints at this time.  Denies headache, no visual changes, no chest pain, no palpitations, no shortness of breath, no abdominal pain, no weakness, no fever/chills/night sweats, no cough/congestion.  No other acute events  overnight per nursing staff.  Objective: Vitals:   10/09/18 1046 10/09/18 1051 10/09/18 1107 10/09/18 1151  BP: [place] Kitchen)  79/48 (!) 71/38 (!) 64/41 99/72  Pulse: 64 73 69 85  Resp: (!) 23 20 17 14   Temp:  (!) 97.3 F (36.3 C)    TempSrc:  Oral    SpO2: 95% 91% 95% 97%  Weight:      Height:        Intake/Output Summary (Last 24 hours) at 10/09/2018 1250 Last data filed at 10/09/2018 0900 Gross per 24 hour  Intake 572.12 ml  Output -  Net 572.12 ml   Filed Weights   10/08/18 0800 10/08/18 1130 10/09/18 0329  Weight: 69.7 kg 69.8 kg 74.6 kg    Examination:  General exam: Appears calm and comfortable  Respiratory system: Clear to auscultation. Respiratory effort normal. Cardiovascular system: S1 & S2 heard, RRR. No JVD, murmurs, rubs, gallops or clicks. No pedal edema. Gastrointestinal system: Abdomen is nondistended, soft and nontender. No organomegaly or masses felt. Normal bowel sounds heard. Central nervous system: Alert and oriented. No focal neurological deficits. Extremities: Bilateral BKA's noted, moves all extremities x4 Skin: No rashes, lesions or ulcers Psychiatry: Judgement and insight appear normal. Mood & affect appropriate.     Data Reviewed: I have personally reviewed following labs and imaging studies  CBC: Recent Labs  Lab 10/06/18 1223 10/06/18 1926 10/07/18 0434 10/08/18 0335 10/08/18 0815 10/09/18 0156  WBC 6.6  --  8.7 8.1 8.3 6.8  HGB 13.2 12.2* 12.1* 11.6* 11.5* 10.8*  HCT 41.7 36.0* 38.7* 35.9* 35.9* 33.6*  MCV 98.8  --  100.3* 98.9 99.2 99.7  PLT 129*  --  95* 117* 125* 295*   Basic Metabolic Panel: Recent Labs  Lab 10/06/18 1223 10/06/18 1926 10/07/18 0434 10/08/18 0536 10/08/18 2108 10/09/18 0156  NA 140 136 141 137 138 138  K 4.9 5.0 5.7* 6.5* 4.5 4.3  CL 88*  --  97* 95* 100 100  CO2 32  --  29 27 24 23   GLUCOSE 120* 133* 122* 117* 139* 115*  BUN 29*  --  34* 55* 28* 27*  CREATININE 5.51*  --  5.71* 7.09* 4.22* 4.22*  CALCIUM  11.1*  --  9.4 9.2 9.2 9.1  MG  --   --   --   --   --  1.9   GFR: Estimated Creatinine Clearance: 14.9 mL/min (A) (by C-G formula based on SCr of 4.22 mg/dL (H)). Liver Function Tests: Recent Labs  Lab 10/06/18 1223 10/08/18 0536 10/09/18 0156  AST 13* 10* 10*  ALT 11 8 8   ALKPHOS 147* 108 95  BILITOT 1.4* 0.9 0.6  PROT 9.1* 7.4 6.6  ALBUMIN 3.8 3.0* 2.6*   Recent Labs  Lab 10/06/18 1223  LIPASE 30   No results for input(s): AMMONIA in the last 168 hours. Coagulation Profile: Recent Labs  Lab 10/08/18 0335 10/09/18 0156  INR 1.5* 1.6*   Cardiac Enzymes: Recent Labs  Lab 10/06/18 1223  TROPONINI 0.08*   BNP (last 3 results) No results for input(s): PROBNP in the last 8760 hours. HbA1C: No results for input(s): HGBA1C in the last 72 hours. CBG: Recent Labs  Lab 10/06/18 2052  GLUCAP 122*   Lipid Profile: No results for input(s): CHOL, HDL, LDLCALC, TRIG, CHOLHDL, LDLDIRECT in the last 72 hours. Thyroid Function Tests: No results for input(s): TSH, T4TOTAL, FREET4, T3FREE, THYROIDAB in the last 72 hours. Anemia Panel: No results for input(s): VITAMINB12, FOLATE, FERRITIN, TIBC, IRON, RETICCTPCT in the last 72 hours. Sepsis Labs: Recent Labs  Lab 10/06/18 1223 10/08/18 1841 10/08/18 2108  10/09/18 0156  LATICACIDVEN 2.3* 2.7* 2.4* 2.4*    Recent Results (from the past 240 hour(s))  SARS Coronavirus 2 (CEPHEID - Performed in [place] hospital lab), Hosp Order     Status: None   Collection Time: 10/06/18 12:23 PM  Result Value Ref Range Status   SARS Coronavirus 2 NEGATIVE NEGATIVE Final    Comment: (NOTE) If result is NEGATIVE SARS-CoV-2 target nucleic acids are NOT DETECTED. The SARS-CoV-2 RNA is generally detectable in upper and lower  respiratory specimens during the acute phase of infection. The lowest  concentration of SARS-CoV-2 viral copies this assay can detect is 250  copies / mL. A negative result does not preclude SARS-CoV-2 infection   and should not be used as the sole basis for treatment or other  patient management decisions.  A negative result may occur with  improper specimen collection / handling, submission of specimen other  than nasopharyngeal swab, presence of viral mutation(s) within the  areas targeted by this assay, and inadequate number of viral copies  (<250 copies / mL). A negative result must be combined with clinical  observations, patient history, and epidemiological information. If result is POSITIVE SARS-CoV-2 target nucleic acids are DETECTED. The SARS-CoV-2 RNA is generally detectable in upper and lower  respiratory specimens dur ing the acute phase of infection.  Positive  results are indicative of active infection with SARS-CoV-2.  Clinical  correlation with patient history and other diagnostic information is  necessary to determine patient infection status.  Positive results do  not rule out bacterial infection or co-infection with other viruses. If result is PRESUMPTIVE POSTIVE SARS-CoV-2 nucleic acids MAY BE PRESENT.   A presumptive positive result was obtained on the submitted specimen  and confirmed on repeat testing.  While 2019 novel coronavirus  (SARS-CoV-2) nucleic acids may be present in the submitted sample  additional confirmatory testing may be necessary for epidemiological  and / or clinical management purposes  to differentiate between  SARS-CoV-2 and other Sarbecovirus currently known to infect humans.  If clinically indicated additional testing with an alternate test  methodology [id number]) is advised. The SARS-CoV-2 RNA is generally  detectable in upper and lower respiratory sp ecimens during the acute  phase of infection. The expected result is Negative. Fact Sheet for Patients:  StrictlyIdeas.no Fact Sheet for Healthcare Providers: BankingDealers.co.za This test is not yet approved or cleared by the [place] FDA and  has been authorized for detection and/or diagnosis of SARS-CoV-2 by FDA under an Emergency Use Authorization (EUA).  This EUA will remain in effect (meaning this test can be used) for the duration of the COVID-19 declaration under Section 564(b)(1) of the Act, 21 U.S.C. section 360bbb-3(b)(1), unless the authorization is terminated or revoked sooner. Performed at [facility], [place] [address], [place],  [address]          Radiology Studies: No results found.      Scheduled Meds: . acetaminophen  650 mg Oral Q6H  . Chlorhexidine Gluconate Cloth  6 each Topical Q0600  . enoxaparin (LOVENOX) injection  30 mg Subcutaneous Q24H  . feeding supplement (NEPRO CARB STEADY)  237 mL Oral BID BM  . midodrine  10 mg Oral TID WC  . multivitamin  1 tablet Oral QHS   Continuous Infusions:   LOS: 3 days    Time spent: 28 minutes    Demaria Deeney J [place] DO [facility] Pager [phone]  If 7PM-7AM, please contact night-coverage [email or web address] Password [email or web address] 10/09/2018, 12:50  PM

## 2018-10-09 NOTE — Evaluation (Addendum)
Physical Therapy Evaluation Patient Details Name: Clayton [name] MRN: [id number] DOB: Apr 21, 1946 Today's Date: 10/09/2018   History of Present Illness  Patient is a 73 y/o male presenting to the ED on 10/06/2018 with primary complaints of abdominal pain. S/p Umbilical hernia repair with Phasix mesh on 10/06/18. 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     Clinical Impression  Patient admitted s/p above listed procedure. Patient reports that prior to admission he used power w/c for primary means of mobility - does receive home health services to assist with ADLs, household tasks and community outings. Patient today performing bed level mobility at min guard level - noted low BP, however he states this is near his baseline with nursing and MD aware. Patient with power w/c in room, however did not transfer today as he is attached to tele. Sat EOB x 8 minutes with good tolerance and no subjective complaints. Able to laterally scoot EOB R <> L with min guard for safety. Will recommend continued [facility] services at discharge with additional [place]. PT to continue to follow.   BP supine: 79/48 BP sitting 71/38 - patient asymptomatic and all other vitals stable    Follow Up Recommendations Home health PT;Supervision/Assistance - 24 hour    Equipment Recommendations  None recommended by PT    Recommendations for Other Services       Precautions / Restrictions Precautions Precautions: Fall Precaution Comments: low BP - reports this is at baseline Restrictions Weight Bearing Restrictions: No      Mobility  Bed Mobility Overal bed mobility: Needs Assistance Bed Mobility: Supine to Sit;Sit to Supine     Supine to sit: Min guard Sit to supine: Min guard   General bed mobility comments: increased time and effort; use of bed rails; scooting R<> L EOB with min guard,  cueing for increased clearance;   Transfers                 General transfer comment: deferred due to low BP - wanting to get into power chair, however on tele; did not desire to sit in recliner  Ambulation/Gait                Stairs            Wheelchair Mobility    Modified Rankin (Stroke Patients Only)       Balance Overall balance assessment: Needs assistance Sitting-balance support: Bilateral upper extremity supported Sitting balance-Leahy Scale: Fair Sitting balance - Comments: sitting EOB with B UE resting on bedside                                     Pertinent Vitals/Pain Pain Assessment: 0-10 Pain Score: 3  Pain Location: abdomen Pain Descriptors / Indicators: Discomfort;Guarding Pain Intervention(s): Limited activity within patient's tolerance;Monitored during session;Repositioned    Home Living Family/patient expects to be discharged to:: Private residence Living Arrangements: Alone Available Help at Discharge: Personal care attendant(M-F 7:30-10 am; M/W/F 6-8 pm) Type of Home: House Home Access: Ramped entrance     Home Layout: Two level Home Equipment: Wheelchair - power;Walker - 2 wheels;Bedside commode;Shower seat;Wheelchair - manual;Hospital bed      Prior Function Level of Independence: Needs assistance   Gait / Transfers Assistance Needed: uses power chair; was ableto laterally transfer with supervision  ADL's / Homemaking  Assistance Needed: aide assist with driving, community outings; assist for bathing        Hand Dominance        Extremity/Trunk Assessment   Upper Extremity Assessment Upper Extremity Assessment: Defer to OT evaluation    Lower Extremity Assessment Lower Extremity Assessment: Generalized weakness(BLE BKA)    Cervical / Trunk Assessment Cervical / Trunk Assessment: Normal  Communication   Communication: No difficulties  Cognition Arousal/Alertness: Awake/alert Behavior During  Therapy: WFL for tasks assessed/performed Overall Cognitive Status: Within Functional Limits for tasks assessed                                        General Comments      Exercises     Assessment/Plan    PT Assessment Patient needs continued PT services  PT Problem List Decreased strength;Decreased activity tolerance;Decreased balance;Decreased mobility;Decreased safety awareness       PT Treatment Interventions DME instruction;Stair training;Functional mobility training;Therapeutic activities;Therapeutic exercise;Balance training;Patient/family education    PT Goals (Current goals can be found in the Care Plan section)  Acute Rehab PT Goals Patient Stated Goal: return home soon PT Goal Formulation: With patient Time For Goal Achievement: 10/23/18 Potential to Achieve Goals: Good    Frequency Min 3X/week   Barriers to discharge        Co-evaluation               AM-PAC PT "6 Clicks" Mobility  Outcome Measure Help needed turning from your back to your side while in a flat bed without using bedrails?: A Little Help needed moving from lying on your back to sitting on the side of a flat bed without using bedrails?: A Little Help needed moving to and from a bed to a chair (including a wheelchair)?: A Little Help needed standing up from a chair using your arms (e.g., wheelchair or bedside chair)?: Total Help needed to walk in hospital room?: Total Help needed climbing 3-5 steps with a railing? : Total 6 Click Score: 12    End of Session   Activity Tolerance: Patient tolerated treatment well Patient left: in bed;with call bell/phone within reach;with bed alarm set Nurse Communication: Mobility status PT Visit Diagnosis: Other abnormalities of gait and mobility (R26.89);Unsteadiness on feet (R26.81);Muscle weakness (generalized) (M62.81)    Time: 1030-1058 PT Time Calculation (min) (ACUTE ONLY): 28 min   Charges:   PT Evaluation $PT Eval  Moderate Complexity: 1 Mod PT Treatments $Therapeutic Activity: 8-22 mins         Lanney Gins, PT, DPT Supplemental Physical Therapist 10/09/18 11:16 AM Pager: [phone] Office: [phone]

## 2018-10-10 DIAGNOSIS — Z992 Dependence on renal dialysis: Secondary | ICD-10-CM

## 2018-10-10 DIAGNOSIS — I5022 Chronic systolic (congestive) heart failure: Secondary | ICD-10-CM

## 2018-10-10 DIAGNOSIS — N186 End stage renal disease: Secondary | ICD-10-CM

## 2018-10-10 DIAGNOSIS — L8990 Pressure ulcer of unspecified site, unspecified stage: Secondary | ICD-10-CM

## 2018-10-10 DIAGNOSIS — Z89512 Acquired absence of left leg below knee: Secondary | ICD-10-CM

## 2018-10-10 DIAGNOSIS — Z89511 Acquired absence of right leg below knee: Secondary | ICD-10-CM

## 2018-10-10 LAB — BASIC METABOLIC PANEL
Anion gap: 12 (ref 5–15)
BUN: 42 mg/dL — ABNORMAL HIGH (ref 8–23)
CO2: 23 mmol/L (ref 22–32)
Calcium: 9.4 mg/dL (ref 8.9–10.3)
Chloride: 102 mmol/L (ref 98–111)
Creatinine, Ser: 5.25 mg/dL — ABNORMAL HIGH (ref 0.61–1.24)
GFR calc Af Amer: 12 mL/min — ABNORMAL LOW (ref >60)
GFR calc non Af Amer: 10 mL/min — ABNORMAL LOW (ref >60)
Glucose, Bld: 78 mg/dL (ref 70–99)
Potassium: 4.6 mmol/L (ref 3.5–5.1)
Sodium: 137 mmol/L (ref 135–145)

## 2018-10-10 LAB — GLUCOSE, CAPILLARY: Glucose-Capillary: 82 mg/dL (ref 70–99)

## 2018-10-10 LAB — MAGNESIUM: Magnesium: 1.9 mg/dL (ref 1.7–2.4)

## 2018-10-10 MED ORDER — POLYETHYLENE GLYCOL 3350 17 G PO PACK: 17.0000 g | PACK | Freq: Every day | ORAL | Status: DC | PRN

## 2018-10-10 MED ORDER — MIDODRINE HCL 5 MG PO TABS
ORAL_TABLET | ORAL | Status: AC
  Administered 2018-10-10: 10 mg via ORAL
  Filled 2018-10-10: qty 2

## 2018-10-10 MED ORDER — DOCUSATE SODIUM 100 MG PO CAPS
100.0000 mg | ORAL_CAPSULE | Freq: Two times a day (BID) | ORAL | Status: DC
  Administered 2018-10-10 – 2018-10-13 (×7): 100 mg via ORAL
  Filled 2018-10-10 (×7): qty 1

## 2018-10-10 NOTE — Progress Notes (Signed)
PROGRESS NOTE    Clayton [name]  HEN:[id number] DOB: [birth year] DOA: 10/06/2018 PCP: [name], PA-C (Inactive)   Brief Narrative:   Clayton [name] a 73 y.o.malewith medical history significant ofESRD on dialysis Monday Wednesday Friday, chronic systolic heart failure, diabetes mellitus, bilateral BKA, coronary artery disease, umbilical hernia presents today with abdominal pain for 2 to 3 weeks worsened over the last 24 hours. Patient denies any fever or chills denies headache shortness of breath chest pain back pain, dysuria. He reports persistent nausea and multiple episodes of nonbloody nonbilious vomiting. Abdominal pain is mostly around the umbilicus nonradiating and moderate to severe and constant.  Admitted for incarcerated umbilical hernia with SBO, now s/p umbilical hernia repair with surgery.     Assessment & Plan:   Active Problems:   ESRD (end stage renal disease) on dialysis (HCC)   Chronic systolic CHF (congestive heart failure), NYHA class 3 (HCC)   S/P bilateral BKA (below knee amputation) ([place])   Umbilical hernia, incarcerated   Pressure injury of skin   SBO (small bowel obstruction) ([place])   Incarcerated umbilical hernia with small bowel obstruction Patient presenting with progressive abdominal discomfort for 2-3 weeks with acute worsening over the past 24 hours prior to hospitalization.  CT abdomen/pelvis on admission notable for proximal small bowel distention with narrow necked umbilical hernia containing a single loop of small bowel in fluid with decompression of the distal small bowel and colon concerning for obstruction. --General surgery following, appreciate assistance --S/p umbilical hernia repair with phasix mesh on 5/24 --Continues with flatus, with small bowel movement this am --diet advanced to soft today, further advancement per General Surgery --PT for mobilization/out of bed to chair at least twice daily  ESRD on MWF dialysis   Hyperkalemia: --Nephrology following, appreciate assistance --Potassium improved from 6.5 to 4.3 today following HD on 5/26 --Continue to follow potassium daily --HD per nephrology recommendations  Nonsustained ventricular tachycardia Patient was noted to have an 18 beat run of V. tach during bowel movements.  Patient was asymptomatic.  Rechecked magnesium level which was normal at 1.9. --Continue to monitor on telemetry --Daily electrolytes  Persistent nausea and vomiting: resolved --Continue antiemetics as needed  Chronic hypotension:  Systolic blood pressures in the mid 80s overnight.   Asymptomatic --Continue midodrine 10 mg p.o. 3 times daily --Continue to monitor blood pressure closely  Chronic systolic heart failure Patient appears compensated. Fluid management per HD  Elevated troponin: Lower than previous in setting of SBO and ESRD.  No c/o c/w ACS.   Possible cirrhosis noted on imaging Thrombocytopenia CT abdomen/pelvis with hepatic morphology suggestive of cirrhosis with ascites. LFTs within normal limits.  INR elevated to 1.6.  Platelets 108. --Avoid hepatotoxins --Will need further outpatient follow-up/surveillance  Possible Cirrhosis based on imaging: follow CMP, INR, CBC  S/p bilateral BKA  Patient reports currently lives at home alone with the assistance of home health aides.  PT recommends home health PT when ready for discharge.   DVT prophylaxis: Lovenox Code Status: Full code Family Communication: None - patient declined calling family Disposition Plan: Continue inpatient until bowel function returns, PT recommends home health on discharge.   Consultants:   General surgery  Nephrology  Procedures: umbilical hernia repair with phasix mesh on 5/24  Antimicrobials:   Perioperative cefotetan   Subjective: Patient seen following return from hemodialysis.  Nursing noted 18 beat run of nonsustained V. tach during bowel movement.  Patient  was asymptomatic.  Patient without any other complaints.  Diet currently advanced to soft, and tolerating.  Denies headache, no visual changes, no chest pain, no palpitations, no shortness of breath, no abdominal pain, no weakness, no fever/chills/night sweats, no cough/congestion.  No other acute events overnight per nursing staff.  Objective: Vitals:   10/10/18 1100 10/10/18 1130 10/10/18 1147 10/10/18 1243  BP: (!) 72/31 (!) 82/27 (!) 81/31 (!) 76/53  Pulse: 74 74 71 69  Resp:   (!) 24 16  Temp:   (!) 97.5 F (36.4 C) (!) 95.4 F (35.2 C)  TempSrc:   Oral Oral  SpO2:   93% 94%  Weight:   74.3 kg   Height:        Intake/Output Summary (Last 24 hours) at 10/10/2018 1616 Last data filed at 10/10/2018 1147 Gross per 24 hour  Intake 0 ml  Output 913 ml  Net -913 ml   Filed Weights   10/09/18 0329 10/10/18 0730 10/10/18 1147  Weight: 74.6 kg 74.8 kg 74.3 kg    Examination:  General exam: Appears calm and comfortable  Respiratory system: Clear to auscultation. Respiratory effort normal. Cardiovascular system: S1 & S2 heard, RRR. No JVD, murmurs, rubs, gallops or clicks. No pedal edema. Gastrointestinal system: Abdomen is nondistended, soft and nontender. No organomegaly or masses felt. Normal bowel sounds heard. Central nervous system: Alert and oriented. No focal neurological deficits. Extremities: Bilateral BKA's noted, moves all extremities x4 Skin: No rashes, lesions or ulcers Psychiatry: Judgement and insight appear normal. Mood & affect appropriate.     Data Reviewed: I have personally reviewed following labs and imaging studies  CBC: Recent Labs  Lab 10/06/18 1223 10/06/18 1926 10/07/18 0434 10/08/18 0335 10/08/18 0815 10/09/18 0156  WBC 6.6  --  8.7 8.1 8.3 6.8  HGB 13.2 12.2* 12.1* 11.6* 11.5* 10.8*  HCT 41.7 36.0* 38.7* 35.9* 35.9* 33.6*  MCV 98.8  --  100.3* 98.9 99.2 99.7  PLT 129*  --  95* 117* 125* 710*   Basic Metabolic Panel: Recent Labs  Lab  10/07/18 0434 10/08/18 0536 10/08/18 2108 10/09/18 0156 10/10/18 0239 10/10/18 1527  NA 141 137 138 138 137  --   K 5.7* 6.5* 4.5 4.3 4.6  --   CL 97* 95* 100 100 102  --   CO2 29 27 24 23 23   --   GLUCOSE 122* 117* 139* 115* 78  --   BUN 34* 55* 28* 27* 42*  --   CREATININE 5.71* 7.09* 4.22* 4.22* 5.25*  --   CALCIUM 9.4 9.2 9.2 9.1 9.4  --   MG  --   --   --  1.9  --  1.9   GFR: Estimated Creatinine Clearance: 12 mL/min (A) (by C-G formula based on SCr of 5.25 mg/dL (H)). Liver Function Tests: Recent Labs  Lab 10/06/18 1223 10/08/18 0536 10/09/18 0156  AST 13* 10* 10*  ALT 11 8 8   ALKPHOS 147* 108 95  BILITOT 1.4* 0.9 0.6  PROT 9.1* 7.4 6.6  ALBUMIN 3.8 3.0* 2.6*   Recent Labs  Lab 10/06/18 1223  LIPASE 30   No results for input(s): AMMONIA in the last 168 hours. Coagulation Profile: Recent Labs  Lab 10/08/18 0335 10/09/18 0156  INR 1.5* 1.6*   Cardiac Enzymes: Recent Labs  Lab 10/06/18 1223  TROPONINI 0.08*   BNP (last 3 results) No results for input(s): PROBNP in the last 8760 hours. HbA1C: No results for input(s): HGBA1C in the last 72 hours. CBG: Recent Labs  Lab 10/06/18 2052  10/10/18 1252  GLUCAP 122* 82   Lipid Profile: No results for input(s): CHOL, HDL, LDLCALC, TRIG, CHOLHDL, LDLDIRECT in the last 72 hours. Thyroid Function Tests: No results for input(s): TSH, T4TOTAL, FREET4, T3FREE, THYROIDAB in the last 72 hours. Anemia Panel: No results for input(s): VITAMINB12, FOLATE, FERRITIN, TIBC, IRON, RETICCTPCT in the last 72 hours. Sepsis Labs: Recent Labs  Lab 10/06/18 1223 10/08/18 1841 10/08/18 2108 10/09/18 0156  LATICACIDVEN 2.3* 2.7* 2.4* 2.4*    Recent Results (from the past 240 hour(s))  SARS Coronavirus 2 (CEPHEID - Performed in [place] hospital lab), Hosp Order     Status: None   Collection Time: 10/06/18 12:23 PM  Result Value Ref Range Status   SARS Coronavirus 2 NEGATIVE NEGATIVE Final    Comment: (NOTE) If  result is NEGATIVE SARS-CoV-2 target nucleic acids are NOT DETECTED. The SARS-CoV-2 RNA is generally detectable in upper and lower  respiratory specimens during the acute phase of infection. The lowest  concentration of SARS-CoV-2 viral copies this assay can detect is 250  copies / mL. A negative result does not preclude SARS-CoV-2 infection  and should not be used as the sole basis for treatment or other  patient management decisions.  A negative result may occur with  improper specimen collection / handling, submission of specimen other  than nasopharyngeal swab, presence of viral mutation(s) within the  areas targeted by this assay, and inadequate number of viral copies  (<250 copies / mL). A negative result must be combined with clinical  observations, patient history, and epidemiological information. If result is POSITIVE SARS-CoV-2 target nucleic acids are DETECTED. The SARS-CoV-2 RNA is generally detectable in upper and lower  respiratory specimens dur ing the acute phase of infection.  Positive  results are indicative of active infection with SARS-CoV-2.  Clinical  correlation with patient history and other diagnostic information is  necessary to determine patient infection status.  Positive results do  not rule out bacterial infection or co-infection with other viruses. If result is PRESUMPTIVE POSTIVE SARS-CoV-2 nucleic acids MAY BE PRESENT.   A presumptive positive result was obtained on the submitted specimen  and confirmed on repeat testing.  While 2019 novel coronavirus  (SARS-CoV-2) nucleic acids may be present in the submitted sample  additional confirmatory testing may be necessary for epidemiological  and / or clinical management purposes  to differentiate between  SARS-CoV-2 and other Sarbecovirus currently known to infect humans.  If clinically indicated additional testing with an alternate test  methodology [phone]) is advised. The SARS-CoV-2 RNA is generally   detectable in upper and lower respiratory sp ecimens during the acute  phase of infection. The expected result is Negative. Fact Sheet for Patients:  StrictlyIdeas.no Fact Sheet for Healthcare Providers: BankingDealers.co.za This test is not yet approved or cleared by the [place] FDA and has been authorized for detection and/or diagnosis of SARS-CoV-2 by FDA under an Emergency Use Authorization (EUA).  This EUA will remain in effect (meaning this test can be used) for the duration of the COVID-19 declaration under Section 564(b)(1) of the Act, 21 U.S.C. section 360bbb-3(b)(1), unless the authorization is terminated or revoked sooner. Performed at [facility], [place] [address], [place], [place] [address]          Radiology Studies: No results found.      Scheduled Meds: . acetaminophen  650 mg Oral Q6H  . Chlorhexidine Gluconate Cloth  6 each Topical Q0600  . Chlorhexidine Gluconate Cloth  6  each Topical Q0600  . docusate sodium  100 mg Oral BID  . enoxaparin (LOVENOX) injection  30 mg Subcutaneous Q24H  . feeding supplement (NEPRO CARB STEADY)  237 mL Oral BID BM  . midodrine  10 mg Oral TID WC  . multivitamin  1 tablet Oral QHS   Continuous Infusions:   LOS: 4 days    Time spent: 28 minutes    Elberta Lachapelle J [place] DO [facility] Pager [phone]  If 7PM-7AM, please contact night-coverage [email or web address] Password [facility] 10/10/2018, 4:16 PM

## 2018-10-10 NOTE — Progress Notes (Signed)
[facility] Progress Note  4 Days Post-Op  Subjective: CC: no complaints Patient reports pain is minimal. Tolerating FLD. Passing flatus, no BM yet.   Objective: Vital signs in last 24 hours: Temp:  [97.3 F (36.3 C)-97.9 F (36.6 C)] 97.9 F (36.6 C) (05/28 0730) Pulse Rate:  [56-85] 71 (05/28 0900) Resp:  [12-26] 21 (05/28 0900) BP: (64-121)/(38-88) 87/48 (05/28 0900) SpO2:  [90 %-100 %] 92 % (05/28 0730) Weight:  [74.8 kg] 74.8 kg (05/28 0730) Last BM Date: 10/03/18  Intake/Output from previous day: 05/27 0701 - 05/28 0700 In: 452.1 [P.O.:240; IV Piggyback:212.1] Out: -  Intake/Output this shift: No intake/output data recorded.  PE: Gen:  Alert, NAD, pleasant Pulm:  Normal effort Abd: Soft, non-tender, mildly distended, +BS, no HSM, incision C/D/I with steri-strips present Skin: warm and dry, no rashes  Psych: A&Ox3   Lab Results:  Recent Labs    10/08/18 0815 10/09/18 0156  WBC 8.3 6.8  HGB 11.5* 10.8*  HCT 35.9* 33.6*  PLT 125* 108*   BMET Recent Labs    10/09/18 0156 10/10/18 0239  NA 138 137  K 4.3 4.6  CL 100 102  CO2 23 23  GLUCOSE 115* 78  BUN 27* 42*  CREATININE 4.22* 5.25*  CALCIUM 9.1 9.4   PT/INR Recent Labs    10/08/18 0335 10/09/18 0156  LABPROT 18.3* 18.4*  INR 1.5* 1.6*   CMP     Component Value Date/Time   NA 137 10/10/2018 0239   NA 137 11/26/2012 1131   K 4.6 10/10/2018 0239   K 3.5 11/26/2012 1131   CL 102 10/10/2018 0239   CL 100 11/26/2012 1131   CO2 23 10/10/2018 0239   CO2 29 11/26/2012 1131   GLUCOSE 78 10/10/2018 0239   GLUCOSE 90 11/26/2012 1131   BUN 42 (H) 10/10/2018 0239   BUN 36 (H) 11/26/2012 1131   CREATININE 5.25 (H) 10/10/2018 0239   CREATININE 5.93 (H) 11/26/2012 1131   CALCIUM 9.4 10/10/2018 0239   CALCIUM 9.4 11/26/2012 1131   PROT 6.6 10/09/2018 0156   PROT 7.5 11/26/2012 1131   ALBUMIN 2.6 (L) 10/09/2018 0156   ALBUMIN 2.2 (L) 11/26/2012 1131   AST 10 (L) 10/09/2018 0156   AST 15 11/26/2012 1131   ALT 8 10/09/2018 0156   ALT 11 (L) 11/26/2012 1131   ALKPHOS 95 10/09/2018 0156   ALKPHOS 228 (H) 11/26/2012 1131   BILITOT 0.6 10/09/2018 0156   BILITOT 0.9 11/26/2012 1131   GFRNONAA 10 (L) 10/10/2018 0239   GFRNONAA 9 (L) 11/26/2012 1131   GFRAA 12 (L) 10/10/2018 0239   GFRAA 11 (L) 11/26/2012 1131   Lipase     Component Value Date/Time   LIPASE 30 10/06/2018 1223       Studies/Results: No results found.  Anti-infectives: Anti-infectives (From admission, onward)   Start     Dose/Rate Route Frequency Ordered Stop   10/07/18 0600  cefoTEtan (CEFOTAN) 2 g in sodium chloride 0.9 % 100 mL IVPB     2 g 200 mL/hr over 30 Minutes Intravenous To [facility] 10/06/18 1632 10/06/18 1936       Assessment/Plan DM CAD CHF ESRD on HD (M/W/F)  Hypotension - fluid management per primary team and nephrology Hyperkalemia - resolved after HD  Incarcerated recurrent umbilical hernia S/pUmbilical hernia repair with Phasixmesh- Dr. Donnie Mesa - 10/06/2018 - POD #4 - passing flatus, has been taking in FLD and tolerating - advance to soft - add colace  BID for bowel regimen -mobilize as able, continue PT - IS  FEN -soft diet VTE -Lovenox (prophlactic-renal dosing) ID -Cefotetan peri-op  LOS: 4 days    Brigid Re , [facility] 10/10/2018, 9:35 AM Pager: [phone] Consults: [phone]

## 2018-10-10 NOTE — Care Management Important Message (Signed)
Important Message  Patient Details  Name: Clayton [name] MRN: [id number] Date of Birth: Dec 01, 1945   Medicare Important Message Given:  Yes    [name] 10/10/2018, 4:05 PM

## 2018-10-10 NOTE — Progress Notes (Signed)
Pt had a 18 beat run of V-tach/Vfib during bowel movement (very small bm). Pt currently asymptomatic, showing no signs of distress. Pt attempted to eat lunch tray, but couldn't eat much due to it hurting his stomach. MD made aware of heart activity. RN to continue to monitor.

## 2018-10-10 NOTE — Progress Notes (Signed)
[facility] Progress Note  Subjective: no c/o, BP's in 70's, on HD.  Passing gas, small BM  Vitals:   10/10/18 1000 10/10/18 1015 10/10/18 1030 10/10/18 1100  BP: (!) 64/37 (!) 67/45 (!) 71/40 (!) 72/31  Pulse: 76 73 74 74  Resp: (!) 28 16 14    Temp:      TempSrc:      SpO2:      Weight:      Height:        Inpatient medications: . acetaminophen  650 mg Oral Q6H  . Chlorhexidine Gluconate Cloth  6 each Topical Q0600  . Chlorhexidine Gluconate Cloth  6 each Topical Q0600  . docusate sodium  100 mg Oral BID  . enoxaparin (LOVENOX) injection  30 mg Subcutaneous Q24H  . feeding supplement (NEPRO CARB STEADY)  237 mL Oral BID BM  . midodrine  10 mg Oral TID WC  . multivitamin  1 tablet Oral QHS    diphenhydrAMINE **OR** diphenhydrAMINE, ondansetron **OR** ondansetron (ZOFRAN) IV, oxyCODONE, polyethylene glycol    Exam: Gen alert chron ill appearing and pleasant No jvd or bruits Chest clear bilat no rales RRR no MRG Abd soft ntnd no mass or ascites +bs MS bilat BKA Ext no LE edema Neuro is alert, Ox 3 , nf L FA AVF +bruit   Home meds:  - midodrine 10 tid  - insluin aspart ac ssi    MWF [place]  4h  72.5kg   Heparin none  L AVF   Assessment: 1. Incarb umbilical hernia w/ SBO - sp OR repair 5/24. Starting to have some return of bowel function 2. ESRD - on HD MWF. Had HD Tuesday, will keep on TTS this week to help w/ staffing 3. Chronic hypotension - on midodrine, usual BP's 80's - 90's, drops into 60's - 70's on HD per patient. "It's been like this for years".  4. Volume - no edema, +2kg ,UF as tolerated 5. DM2 on insulin 6. Hx CHF/ AICD implant 7. Anemia ckd - Hb 10- 12 here, no need esa 8. Hyperkalemia - resolved      [facility] 10/10/2018, 11:25 AM  Iron/TIBC/Ferritin/ %Sat No results found for: IRON, TIBC, FERRITIN, IRONPCTSAT Recent Labs  Lab 10/09/18 0156 10/10/18 0239  NA 138 137  K 4.3 4.6  CL  100 102  CO2 23 23  GLUCOSE 115* 78  BUN 27* 42*  CREATININE 4.22* 5.25*  CALCIUM 9.1 9.4  ALBUMIN 2.6*  --   INR 1.6*  --    Recent Labs  Lab 10/09/18 0156  AST 10*  ALT 8  ALKPHOS 95  BILITOT 0.6  PROT 6.6   Recent Labs  Lab 10/09/18 0156  WBC 6.8  HGB 10.8*  HCT 33.6*  PLT 108*

## 2018-10-10 NOTE — Progress Notes (Signed)
Pt's BP is 76/53(63), temp 97.4. Pt asymptomatic, showing no signs of distress. Scheduled dose of midodrine provided. MD made aware. RN to continue to monitor.

## 2018-10-10 NOTE — Progress Notes (Addendum)
PT Cancellation Note  Patient Details Name: Clayton [name] MRN: [id number] DOB: 26-Nov-1945   Cancelled Treatment:     attempted to see patient for treatment following HD. It was noted that the patient had significantly low B/P. Therapy will check back in the afternoon if time permits.    Carney Living PT DPT  10/10/2018, 1:01 PM

## 2018-10-11 LAB — BASIC METABOLIC PANEL
Anion gap: 12 (ref 5–15)
BUN: 21 mg/dL (ref 8–23)
CO2: 27 mmol/L (ref 22–32)
Calcium: 9.1 mg/dL (ref 8.9–10.3)
Chloride: 99 mmol/L (ref 98–111)
Creatinine, Ser: 3.59 mg/dL — ABNORMAL HIGH (ref 0.61–1.24)
GFR calc Af Amer: 19 mL/min — ABNORMAL LOW (ref >60)
GFR calc non Af Amer: 16 mL/min — ABNORMAL LOW (ref >60)
Glucose, Bld: 93 mg/dL (ref 70–99)
Potassium: 3.6 mmol/L (ref 3.5–5.1)
Sodium: 138 mmol/L (ref 135–145)

## 2018-10-11 LAB — MAGNESIUM: Magnesium: 2 mg/dL (ref 1.7–2.4)

## 2018-10-11 MED ORDER — POTASSIUM CHLORIDE CRYS ER 20 MEQ PO TBCR
40.0000 meq | EXTENDED_RELEASE_TABLET | Freq: Once | ORAL | Status: AC
  Administered 2018-10-11: 40 meq via ORAL
  Filled 2018-10-11: qty 2

## 2018-10-11 MED ORDER — CHLORHEXIDINE GLUCONATE CLOTH 2 % EX PADS: 6.0000 | MEDICATED_PAD | Freq: Every day | CUTANEOUS | Status: DC

## 2018-10-11 NOTE — Progress Notes (Signed)
PROGRESS NOTE    Clayton [name]  TFT:[id number] DOB: [birth year] DOA: 10/06/2018 PCP: [name], PA-C (Inactive)   Brief Narrative:   Clayton Mansfield Brooksis a 73 y.o.malewith medical history significant ofESRD on dialysis Monday Wednesday Friday, chronic systolic heart failure, diabetes mellitus, bilateral BKA, coronary artery disease, umbilical hernia presents today with abdominal pain for 2 to 3 weeks worsened over the last 24 hours. Patient denies any fever or chills denies headache shortness of breath chest pain back pain, dysuria. He reports persistent nausea and multiple episodes of nonbloody nonbilious vomiting. Abdominal pain is mostly around the umbilicus nonradiating and moderate to severe and constant.  Admitted for incarcerated umbilical hernia with SBO, now s/p umbilical hernia repair with surgery.     Assessment & Plan:   Active Problems:   ESRD (end stage renal disease) on dialysis (HCC)   Chronic systolic CHF (congestive heart failure), NYHA class 3 (HCC)   S/P bilateral BKA (below knee amputation) ([facility])   Umbilical hernia, incarcerated   Pressure injury of skin   SBO (small bowel obstruction) ([place])   Incarcerated umbilical hernia with small bowel obstruction Patient presenting with progressive abdominal discomfort for 2-3 weeks with acute worsening over the past 24 hours prior to hospitalization.  CT abdomen/pelvis on admission notable for proximal small bowel distention with narrow necked umbilical hernia containing a single loop of small bowel in fluid with decompression of the distal small bowel and colon concerning for obstruction. --General surgery following, appreciate assistance --S/p umbilical hernia repair with phasix mesh on 5/24 --Continues with flatus and bowel movement --Continue soft diet --PT for mobilization/out of bed to chair at least twice daily  ESRD on MWF dialysis  Hyperkalemia: --Nephrology following, appreciate assistance  --Potassium improved with HD, 3.6 today --Continue to follow potassium daily --HD per nephrology recommendations  Nonsustained ventricular tachycardia Patient was noted to have an 18 beat run of V. tach during bowel movements.  Patient was asymptomatic.  Rechecked magnesium level which was normal at 1.9. --Continue to monitor on telemetry --Daily electrolytes  Persistent nausea and vomiting: resolved --Continue antiemetics as needed  Chronic hypotension:  Systolic blood pressures in the mid 80s overnight.   Asymptomatic --Continue midodrine 10 mg p.o. 3 times daily --Continue to monitor blood pressure closely  Chronic systolic heart failure Patient appears compensated. Fluid management per HD  Elevated troponin: Lower than previous in setting of SBO and ESRD.  No c/o c/w ACS.   Possible cirrhosis noted on imaging Thrombocytopenia CT abdomen/pelvis with hepatic morphology suggestive of cirrhosis with ascites. LFTs within normal limits.  INR elevated to 1.6.  Platelets 108. --Avoid hepatotoxins --Will need further outpatient follow-up/surveillance  Possible Cirrhosis based on imaging: follow CMP, INR, CBC  S/p bilateral BKA  Patient reports currently lives at home alone with the assistance of home health aides.  PT recommends home health PT when ready for discharge.   DVT prophylaxis: Lovenox Code Status: Full code Family Communication: None - patient declined calling family Disposition Plan: PT recommends home health on discharge, anticipate discharge home tomorrow with home health services   Consultants:   General surgery  Nephrology  Procedures: umbilical hernia repair with phasix mesh on 5/24  Antimicrobials:   Perioperative cefotetan   Subjective: Patient seen and examined at bedside, resting comfortably.  Awaiting breakfast this morning.  States nausea improved.  Reports large bowel movement yesterday.  Tolerating soft diet.  No other complaints at  this time.  Denies headache, no visual changes, no chest pain,  no palpitations, no shortness of breath, no abdominal pain, no weakness, no fever/chills/night sweats, no cough/congestion.  No other acute events overnight per nursing staff.  Objective: Vitals:   10/11/18 0551 10/11/18 0709 10/11/18 0959 10/11/18 1231  BP: (!) 66/45 (!) 78/54  (!) 68/53  Pulse:  69 (!) 50   Resp: 20 (!) 24 20 (!) 23  Temp: 98.1 F (36.7 C) 97.9 F (36.6 C)    TempSrc: Oral Oral    SpO2:  94% 93% 94%  Weight:      Height:        Intake/Output Summary (Last 24 hours) at 10/11/2018 1405 Last data filed at 10/11/2018 0933 Gross per 24 hour  Intake 230 ml  Output -  Net 230 ml   Filed Weights   10/09/18 0329 10/10/18 0730 10/10/18 1147  Weight: 74.6 kg 74.8 kg 74.3 kg    Examination:  General exam: Appears calm and comfortable  Respiratory system: Clear to auscultation. Respiratory effort normal. Cardiovascular system: S1 & S2 heard, RRR. No JVD, murmurs, rubs, gallops or clicks. No pedal edema. Gastrointestinal system: Abdomen is nondistended, soft and nontender. No organomegaly or masses felt. Normal bowel sounds heard. Central nervous system: Alert and oriented. No focal neurological deficits. Extremities: Bilateral BKA's noted, moves all extremities x4 Skin: No rashes, lesions or ulcers Psychiatry: Judgement and insight appear normal. Mood & affect appropriate.     Data Reviewed: I have personally reviewed following labs and imaging studies  CBC: Recent Labs  Lab 10/06/18 1223 10/06/18 1926 10/07/18 0434 10/08/18 0335 10/08/18 0815 10/09/18 0156  WBC 6.6  --  8.7 8.1 8.3 6.8  HGB 13.2 12.2* 12.1* 11.6* 11.5* 10.8*  HCT 41.7 36.0* 38.7* 35.9* 35.9* 33.6*  MCV 98.8  --  100.3* 98.9 99.2 99.7  PLT 129*  --  95* 117* 125* 381*   Basic Metabolic Panel: Recent Labs  Lab 10/08/18 0536 10/08/18 2108 10/09/18 0156 10/10/18 0239 10/10/18 1527 10/11/18 0224  NA 137 138 138 137  --   138  K 6.5* 4.5 4.3 4.6  --  3.6  CL 95* 100 100 102  --  99  CO2 27 24 23 23   --  27  GLUCOSE 117* 139* 115* 78  --  93  BUN 55* 28* 27* 42*  --  21  CREATININE 7.09* 4.22* 4.22* 5.25*  --  3.59*  CALCIUM 9.2 9.2 9.1 9.4  --  9.1  MG  --   --  1.9  --  1.9 2.0   GFR: Estimated Creatinine Clearance: 17.5 mL/min (A) (by C-G formula based on SCr of 3.59 mg/dL (H)). Liver Function Tests: Recent Labs  Lab 10/06/18 1223 10/08/18 0536 10/09/18 0156  AST 13* 10* 10*  ALT 11 8 8   ALKPHOS 147* 108 95  BILITOT 1.4* 0.9 0.6  PROT 9.1* 7.4 6.6  ALBUMIN 3.8 3.0* 2.6*   Recent Labs  Lab 10/06/18 1223  LIPASE 30   No results for input(s): AMMONIA in the last 168 hours. Coagulation Profile: Recent Labs  Lab 10/08/18 0335 10/09/18 0156  INR 1.5* 1.6*   Cardiac Enzymes: Recent Labs  Lab 10/06/18 1223  TROPONINI 0.08*   BNP (last 3 results) No results for input(s): PROBNP in the last 8760 hours. HbA1C: No results for input(s): HGBA1C in the last 72 hours. CBG: Recent Labs  Lab 10/06/18 2052 10/10/18 1252  GLUCAP 122* 82   Lipid Profile: No results for input(s): CHOL, HDL, LDLCALC, TRIG, CHOLHDL, LDLDIRECT in  the last 72 hours. Thyroid Function Tests: No results for input(s): TSH, T4TOTAL, FREET4, T3FREE, THYROIDAB in the last 72 hours. Anemia Panel: No results for input(s): VITAMINB12, FOLATE, FERRITIN, TIBC, IRON, RETICCTPCT in the last 72 hours. Sepsis Labs: Recent Labs  Lab 10/06/18 1223 10/08/18 1841 10/08/18 2108 10/09/18 0156  LATICACIDVEN 2.3* 2.7* 2.4* 2.4*    Recent Results (from the past 240 hour(s))  SARS Coronavirus 2 (CEPHEID - Performed in [facility] hospital lab), Hosp Order     Status: None   Collection Time: 10/06/18 12:23 PM  Result Value Ref Range Status   SARS Coronavirus 2 NEGATIVE NEGATIVE Final    Comment: (NOTE) If result is NEGATIVE SARS-CoV-2 target nucleic acids are NOT DETECTED. The SARS-CoV-2 RNA is generally detectable in  upper and lower  respiratory specimens during the acute phase of infection. The lowest  concentration of SARS-CoV-2 viral copies this assay can detect is 250  copies / mL. A negative result does not preclude SARS-CoV-2 infection  and should not be used as the sole basis for treatment or other  patient management decisions.  A negative result may occur with  improper specimen collection / handling, submission of specimen other  than nasopharyngeal swab, presence of viral mutation(s) within the  areas targeted by this assay, and inadequate number of viral copies  (<250 copies / mL). A negative result must be combined with clinical  observations, patient history, and epidemiological information. If result is POSITIVE SARS-CoV-2 target nucleic acids are DETECTED. The SARS-CoV-2 RNA is generally detectable in upper and lower  respiratory specimens dur ing the acute phase of infection.  Positive  results are indicative of active infection with SARS-CoV-2.  Clinical  correlation with patient history and other diagnostic information is  necessary to determine patient infection status.  Positive results do  not rule out bacterial infection or co-infection with other viruses. If result is PRESUMPTIVE POSTIVE SARS-CoV-2 nucleic acids MAY BE PRESENT.   A presumptive positive result was obtained on the submitted specimen  and confirmed on repeat testing.  While 2019 novel coronavirus  (SARS-CoV-2) nucleic acids may be present in the submitted sample  additional confirmatory testing may be necessary for epidemiological  and / or clinical management purposes  to differentiate between  SARS-CoV-2 and other Sarbecovirus currently known to infect humans.  If clinically indicated additional testing with an alternate test  methodology [phone]) is advised. The SARS-CoV-2 RNA is generally  detectable in upper and lower respiratory sp ecimens during the acute  phase of infection. The expected result is  Negative. Fact Sheet for Patients:  StrictlyIdeas.no Fact Sheet for Healthcare Providers: BankingDealers.co.za This test is not yet approved or cleared by the [place] FDA and has been authorized for detection and/or diagnosis of SARS-CoV-2 by FDA under an Emergency Use Authorization (EUA).  This EUA will remain in effect (meaning this test can be used) for the duration of the COVID-19 declaration under Section 564(b)(1) of the Act, 21 U.S.C. section 360bbb-3(b)(1), unless the authorization is terminated or revoked sooner. Performed at [facility], [place] [address], [place], [place] [address]          Radiology Studies: No results found.      Scheduled Meds: . acetaminophen  650 mg Oral Q6H  . Chlorhexidine Gluconate Cloth  6 each Topical Q0600  . Chlorhexidine Gluconate Cloth  6 each Topical Q0600  . docusate sodium  100 mg Oral BID  . enoxaparin (LOVENOX) injection  30 mg Subcutaneous  Q24H  . feeding supplement (NEPRO CARB STEADY)  237 mL Oral BID BM  . midodrine  10 mg Oral TID WC  . multivitamin  1 tablet Oral QHS   Continuous Infusions:   LOS: 5 days    Time spent: 28 minutes    Fischer Halley J [place] DO [facility] Pager [phone]  If 7PM-7AM, please contact night-coverage [email or web address] Password [email or web address] 10/11/2018, 2:05 PM

## 2018-10-11 NOTE — Progress Notes (Signed)
[facility] called and stated that the pt was having 8 beats of QRS V tach, paced out of it while he was in V tach pt was having spikes in each V tach. Dr was paged he stated just to watch the pt as long as he was a symptomatic he would be fine.

## 2018-10-11 NOTE — Discharge Instructions (Signed)
[facility]  UMBILICAL HERNIA REPAIR: POST OP INSTRUCTIONS  Always review your discharge instruction sheet given to you by the facility where your surgery was performed. IF YOU HAVE DISABILITY OR FAMILY LEAVE FORMS, YOU MUST BRING THEM TO THE OFFICE FOR PROCESSING.   DO NOT GIVE THEM TO YOUR DOCTOR.  1. A  prescription for pain medication may be given to you upon discharge.  Take your pain medication as prescribed, if needed.  If narcotic pain medicine is not needed, then you may take acetaminophen (Tylenol) or ibuprofen (Advil) as needed. 2. Take your usually prescribed medications unless otherwise directed. If you need a refill on your pain medication, please contact your pharmacy.  They will contact our office to request authorization. Prescriptions will not be filled after 5 pm or on week-ends. 3. You should follow a light diet the first 24 hours after arrival home, such as soup and crackers, etc.  Be sure to include lots of fluids daily.  Resume your normal diet the day after surgery. 4.Most patients will experience some swelling and bruising around the umbilicus or in the groin and scrotum.  Ice packs and reclining will help.  Swelling and bruising can take several days to resolve.  6. It is common to experience some constipation if taking pain medication after surgery.  Increasing fluid intake and taking a stool softener (such as Colace) will usually help or prevent this problem from occurring.  A mild laxative (Milk of Magnesia or Miralax) should be taken according to package directions if there are no bowel movements after 48 hours. 7. Unless discharge instructions indicate otherwise, you may remove your bandages 24-48 hours after surgery, and you may shower at that time.  You may have steri-strips (small skin tapes) in place directly over the incision.  These strips should be left on the skin for 7-10 days.  If your surgeon used skin glue on the incision, you may  shower in 24 hours.  The glue will flake off over the next 2-3 weeks.  Any sutures or staples will be removed at the office during your follow-up visit. 8. ACTIVITIES:  You may resume regular (light) daily activities beginning the next day--such as daily self-care, walking, climbing stairs--gradually increasing activities as tolerated.  You may have sexual intercourse when it is comfortable.  Refrain from any heavy lifting or straining until approved by your doctor.  a.You may drive when you are no longer taking prescription pain medication, you can comfortably wear a seatbelt, and you can safely maneuver your car and apply brakes.   9.You should see your doctor in the office for a follow-up appointment approximately 2-3 weeks after your surgery.  Make sure that you call for this appointment within a day or two after you arrive home to insure a convenient appointment time.  WHEN TO CALL YOUR DOCTOR: 1. Fever over 101.0 2. Inability to urinate 3. Nausea and/or vomiting 4. Extreme swelling or bruising 5. Continued bleeding from incision. 6. Increased pain, redness, or drainage from the incision  The clinic staff is available to answer your questions during regular business hours.  Please dont hesitate to call and ask to speak to one of the nurses for clinical concerns.  If you have a medical emergency, go to the nearest emergency room or call 911.  A surgeon from [facility] is always on call at the hospital   [address], [place], [place], [place]  [address] ?  [address]  [place] [name], [place]   [address] [phone] ? [phone] ? FAX [phone] Web site: [email or web address]

## 2018-10-11 NOTE — Progress Notes (Addendum)
[facility] Progress Note  Subjective: no c/o, seen on HD  Vitals:   10/11/18 0551 10/11/18 0709 10/11/18 0959 10/11/18 1231  BP: (!) 66/45 (!) 78/54  (!) 68/53  Pulse:  69 (!) 50   Resp: 20 (!) 24 20 (!) 23  Temp: 98.1 F (36.7 C) 97.9 F (36.6 C)    TempSrc: Oral Oral    SpO2:  94% 93% 94%  Weight:      Height:        Inpatient medications: . acetaminophen  650 mg Oral Q6H  . Chlorhexidine Gluconate Cloth  6 each Topical Q0600  . Chlorhexidine Gluconate Cloth  6 each Topical Q0600  . docusate sodium  100 mg Oral BID  . enoxaparin (LOVENOX) injection  30 mg Subcutaneous Q24H  . feeding supplement (NEPRO CARB STEADY)  237 mL Oral BID BM  . midodrine  10 mg Oral TID WC  . multivitamin  1 tablet Oral QHS    diphenhydrAMINE **OR** diphenhydrAMINE, ondansetron **OR** ondansetron (ZOFRAN) IV, oxyCODONE, polyethylene glycol    Exam: Gen alert chron ill appearing and pleasant No jvd or bruits Chest clear bilat no rales RRR no MRG Abd soft ntnd no mass or ascites +bs MS bilat BKA Ext no LE edema Neuro is alert, Ox 3 , nf L FA AVF +bruit   Home meds:  - midodrine 10 tid  - insluin aspart ac ssi    MWF [place]  4h  72.5kg   Heparin none  L AVF   Assessment: 1. Incarb umbilical hernia w/ SBO - sp OR repair 5/24.  2. ESRD - usual HD MWF. HD today then Monday.  3. Chronic hypotension - on midodrine, usual BP's 70's - 90's 4. Volume - no edema, at dry wt 5. DM2 on insulin 6. Hx CHF/ AICD implant 7. Anemia ckd - Hb 10- 12 here, no need esa 8. Hyperkalemia - resolved 9. Dispo - dc planned for tomorrow   [facility] 10/02/2018, 2:48 PM  Iron/TIBC/Ferritin/ %Sat No results found for: IRON, TIBC, FERRITIN, IRONPCTSAT Recent Labs  Lab 10/09/18 0156  10/11/18 0224  NA 138   < > 138  K 4.3   < > 3.6  CL 100   < > 99  CO2 23   < > 27  GLUCOSE 115*   < > 93  BUN 27*   < > 21  CREATININE 4.22*   < > 3.59*  CALCIUM  9.1   < > 9.1  ALBUMIN 2.6*  --   --   INR 1.6*  --   --    < > = values in this interval not displayed.   Recent Labs  Lab 10/09/18 0156  AST 10*  ALT 8  ALKPHOS 95  BILITOT 0.6  PROT 6.6   Recent Labs  Lab 10/09/18 0156  WBC 6.8  HGB 10.8*  HCT 33.6*  PLT 108*

## 2018-10-11 NOTE — Progress Notes (Signed)
Physical Therapy Treatment Patient Details Name: Clayton [name] MRN: [id number] DOB: 1945/11/04 Today's Date: 10/11/2018    History of Present Illness Patient is a 73 y/o male presenting to the ED on 10/06/2018 with primary complaints of abdominal pain. S/p Umbilical hernia repair with Phasix mesh on 10/06/18. 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     PT Comments    Pt with good progress towards his physical therapy goals, reporting improved pain control. Transferring from bed to power w/c without physical assist; pt reporting he is weaker than normal. Drove power w/c x 1,000 feet with supervision when negotiating obstacles. D/c plan remains appropriate.     Follow Up Recommendations  Home health PT;Supervision/Assistance - 24 hour     Equipment Recommendations  None recommended by PT    Recommendations for Other Services       Precautions / Restrictions Precautions Precautions: Fall Precaution Comments: low BP - reports this is at baseline Restrictions Weight Bearing Restrictions: No    Mobility  Bed Mobility Overal bed mobility: Needs Assistance Bed Mobility: Supine to Sit     Supine to sit: Supervision     General bed mobility comments: increased time and effort  Transfers Overall transfer level: Needs assistance Equipment used: None Transfers: Lateral/Scoot Transfers          Lateral/Scoot Transfers: Min guard General transfer comment: min guard for safety for transfer from bed > power w/c. PT assisted in set up  Ambulation/Gait                 Theme park manager mobility: Yes Distance: 1000 Wheelchair Assistance Details (indicate cue type and reason): Pt driving power w/c x 3888 feet with supervision for negotiating obstacles  Modified Rankin (Stroke  Patients Only)       Balance Overall balance assessment: Needs assistance Sitting-balance support: Bilateral upper extremity supported Sitting balance-Leahy Scale: Good                                      Cognition Arousal/Alertness: Awake/alert Behavior During Therapy: WFL for tasks assessed/performed Overall Cognitive Status: Within Functional Limits for tasks assessed                                        Exercises      General Comments        Pertinent Vitals/Pain Pain Assessment: Faces Faces Pain Scale: Hurts a little bit Pain Location: abdomen Pain Descriptors / Indicators: Discomfort;Guarding Pain Intervention(s): Monitored during session    Home Living                      Prior Function            PT Goals (current goals can now be found in the care plan section) Acute Rehab PT Goals Patient Stated Goal: return home soon PT Goal Formulation: With patient Time For Goal Achievement: 10/23/18 Potential to Achieve Goals: Good Progress towards PT goals: Progressing toward goals    Frequency    Min 3X/week      PT Plan Current plan remains appropriate    Co-evaluation  AM-PAC PT "6 Clicks" Mobility   Outcome Measure  Help needed turning from your back to your side while in a flat bed without using bedrails?: A Little Help needed moving from lying on your back to sitting on the side of a flat bed without using bedrails?: A Little Help needed moving to and from a bed to a chair (including a wheelchair)?: A Little Help needed standing up from a chair using your arms (e.g., wheelchair or bedside chair)?: Total Help needed to walk in hospital room?: Total Help needed climbing 3-5 steps with a railing? : Total 6 Click Score: 12    End of Session   Activity Tolerance: Patient tolerated treatment well Patient left: with call bell/phone within reach;Other (comment)(in power w/c) Nurse  Communication: Mobility status PT Visit Diagnosis: Other abnormalities of gait and mobility (R26.89);Unsteadiness on feet (R26.81);Muscle weakness (generalized) (M62.81)     Time: 6151-8343 PT Time Calculation (min) (ACUTE ONLY): 30 min  Charges:  $Therapeutic Activity: 23-37 mins                     Ellamae Sia, [place], DPT Acute Rehabilitation Services Pager [phone] Office [phone]    Willy Eddy 10/11/2018, 3:38 PM

## 2018-10-11 NOTE — Progress Notes (Signed)
[facility] Progress Note  5 Days Post-Op  Subjective: CC: no complaints Patient had a BM yesterday and feels much better. Tolerating soft diet. Denies nausea this AM.   Objective: Vital signs in last 24 hours: Temp:  [95.4 F (35.2 C)-98.4 F (36.9 C)] 97.9 F (36.6 C) (05/29 0709) Pulse Rate:  [69-76] 69 (05/29 0709) Resp:  [14-28] 24 (05/29 0709) BP: (64-90)/(27-54) 78/54 (05/29 0709) SpO2:  [93 %-99 %] 94 % (05/29 0709) Weight:  [74.3 kg] 74.3 kg (05/28 1147) Last BM Date: 10/10/18  Intake/Output from previous day: 05/28 0701 - 05/29 0700 In: 0  Out: 913  Intake/Output this shift: No intake/output data recorded.  PE: Gen:  Alert, NAD, pleasant Pulm:  Normal effort Abd: Soft, non-tender, mildly distended, +BS, no HSM, incision C/D/I with steri-strips present Psych: A&Ox3   Lab Results:  Recent Labs    10/09/18 0156  WBC 6.8  HGB 10.8*  HCT 33.6*  PLT 108*   BMET Recent Labs    10/10/18 0239 10/11/18 0224  NA 137 138  K 4.6 3.6  CL 102 99  CO2 23 27  GLUCOSE 78 93  BUN 42* 21  CREATININE 5.25* 3.59*  CALCIUM 9.4 9.1   PT/INR Recent Labs    10/09/18 0156  LABPROT 18.4*  INR 1.6*   CMP     Component Value Date/Time   NA 138 10/11/2018 0224   NA 137 11/26/2012 1131   K 3.6 10/11/2018 0224   K 3.5 11/26/2012 1131   CL 99 10/11/2018 0224   CL 100 11/26/2012 1131   CO2 27 10/11/2018 0224   CO2 29 11/26/2012 1131   GLUCOSE 93 10/11/2018 0224   GLUCOSE 90 11/26/2012 1131   BUN 21 10/11/2018 0224   BUN 36 (H) 11/26/2012 1131   CREATININE 3.59 (H) 10/11/2018 0224   CREATININE 5.93 (H) 11/26/2012 1131   CALCIUM 9.1 10/11/2018 0224   CALCIUM 9.4 11/26/2012 1131   PROT 6.6 10/09/2018 0156   PROT 7.5 11/26/2012 1131   ALBUMIN 2.6 (L) 10/09/2018 0156   ALBUMIN 2.2 (L) 11/26/2012 1131   AST 10 (L) 10/09/2018 0156   AST 15 11/26/2012 1131   ALT 8 10/09/2018 0156   ALT 11 (L) 11/26/2012 1131   ALKPHOS 95 10/09/2018 0156   ALKPHOS 228 (H) 11/26/2012 1131   BILITOT 0.6 10/09/2018 0156   BILITOT 0.9 11/26/2012 1131   GFRNONAA 16 (L) 10/11/2018 0224   GFRNONAA 9 (L) 11/26/2012 1131   GFRAA 19 (L) 10/11/2018 0224   GFRAA 11 (L) 11/26/2012 1131   Lipase     Component Value Date/Time   LIPASE 30 10/06/2018 1223       Studies/Results: No results found.  Anti-infectives: Anti-infectives (From admission, onward)   Start     Dose/Rate Route Frequency Ordered Stop   10/07/18 0600  cefoTEtan (CEFOTAN) 2 g in sodium chloride 0.9 % 100 mL IVPB     2 g 200 mL/hr over 30 Minutes Intravenous To [facility] 10/06/18 1632 10/06/18 1936       Assessment/Plan DM CAD CHF ESRD on HD (M/W/F)  Hypotension - fluid management per primary team and nephrology Hyperkalemia -resolved after HD  Incarcerated recurrent umbilical hernia S/pUmbilical hernia repair with Phasixmesh- Dr. Donnie Mesa - 10/06/2018 - POD #5 -tolerating soft diet and having bowel function  -mobilize as able, continue PT - IS - stable for discharge from a surgical perspective once medically ready. Will arrange f/u and provide instructions.  FEN -soft diet VTE -Lovenox (prophlactic-renal dosing) ID -Cefotetan peri-op  LOS: 5 days    Brigid Re , [facility] 10/11/2018, 9:27 AM Pager: [phone] Consults: [phone]

## 2018-10-12 LAB — RENAL FUNCTION PANEL
Albumin: 2.5 g/dL — ABNORMAL LOW (ref 3.5–5.0)
Anion gap: 15 (ref 5–15)
BUN: 33 mg/dL — ABNORMAL HIGH (ref 8–23)
CO2: 25 mmol/L (ref 22–32)
Calcium: 9.4 mg/dL (ref 8.9–10.3)
Chloride: 95 mmol/L — ABNORMAL LOW (ref 98–111)
Creatinine, Ser: 4.69 mg/dL — ABNORMAL HIGH (ref 0.61–1.24)
GFR calc Af Amer: 13 mL/min — ABNORMAL LOW (ref >60)
GFR calc non Af Amer: 12 mL/min — ABNORMAL LOW (ref >60)
Glucose, Bld: 83 mg/dL (ref 70–99)
Phosphorus: 4.6 mg/dL (ref 2.5–4.6)
Potassium: 4.4 mmol/L (ref 3.5–5.1)
Sodium: 135 mmol/L (ref 135–145)

## 2018-10-12 LAB — CBC
HCT: 32.3 % — ABNORMAL LOW (ref 39.0–52.0)
Hemoglobin: 10.5 g/dL — ABNORMAL LOW (ref 13.0–17.0)
MCH: 31.7 pg (ref 26.0–34.0)
MCHC: 32.5 g/dL (ref 30.0–36.0)
MCV: 97.6 fL (ref 80.0–100.0)
Platelets: 129 10*3/uL — ABNORMAL LOW (ref 150–400)
RBC: 3.31 MIL/uL — ABNORMAL LOW (ref 4.22–5.81)
RDW: 15.9 % — ABNORMAL HIGH (ref 11.5–15.5)
WBC: 5.6 10*3/uL (ref 4.0–10.5)
nRBC: 0.4 % — ABNORMAL HIGH (ref 0.0–0.2)

## 2018-10-12 MED ORDER — MIDODRINE HCL 5 MG PO TABS
ORAL_TABLET | ORAL | Status: AC
  Administered 2018-10-12: 10 mg via ORAL
  Filled 2018-10-12: qty 2

## 2018-10-12 NOTE — Progress Notes (Signed)
6 Days Post-Op     Subjective: On HD tolerating diet well.  Site is sore but looks fine.  He thinks he will go home in AM.    Objective: Vital signs in last 24 hours: Temp:  [97.8 F (36.6 C)-98.3 F (36.8 C)] 97.9 F (36.6 C) (05/30 0655) Pulse Rate:  [67-77] 70 (05/30 0930) Resp:  [18-23] 20 (05/30 0930) BP: (68-95)/(29-59) 80/50 (05/30 0900) SpO2:  [94 %-97 %] 96 % (05/30 0655) Weight:  [75.1 kg] 75.1 kg (05/30 0655) Last BM Date: 10/11/18  Intake/Output from previous day: 05/29 0701 - 05/30 0700 In: 430 [P.O.:430] Out: 0  Intake/Output this shift: No intake/output data recorded.  General appearance: alert, cooperative and no distress GI: tolerating diet, site looks fine, steri strips still in place  Lab Results:  Recent Labs    10/12/18 0805  WBC 5.6  HGB 10.5*  HCT 32.3*  PLT 129*    BMET Recent Labs    10/11/18 0224 10/12/18 0805  NA 138 135  K 3.6 4.4  CL 99 95*  CO2 27 25  GLUCOSE 93 83  BUN 21 33*  CREATININE 3.59* 4.69*  CALCIUM 9.1 9.4   PT/INR No results for input(s): LABPROT, INR in the last 72 hours.  Recent Labs  Lab 10/06/18 1223 10/08/18 0536 10/09/18 0156 10/12/18 0805  AST 13* 10* 10*  --   ALT 11 8 8   --   ALKPHOS 147* 108 95  --   BILITOT 1.4* 0.9 0.6  --   PROT 9.1* 7.4 6.6  --   ALBUMIN 3.8 3.0* 2.6* 2.5*     Lipase     Component Value Date/Time   LIPASE 30 10/06/2018 1223     Medications: . acetaminophen  650 mg Oral Q6H  . Chlorhexidine Gluconate Cloth  6 each Topical Q0600  . docusate sodium  100 mg Oral BID  . enoxaparin (LOVENOX) injection  30 mg Subcutaneous Q24H  . feeding supplement (NEPRO CARB STEADY)  237 mL Oral BID BM  . midodrine  10 mg Oral TID WC  . multivitamin  1 tablet Oral QHS    Assessment/Plan DM CAD CHF ESRD on HD (M/W/F)  Hypotension - fluid management per primary team and nephrology Hyperkalemia -resolved after HD  Incarcerated recurrent umbilical hernia S/pUmbilical  hernia repair with Phasixmesh- Dr. Donnie Mesa - 10/06/2018 - POD #5 -tolerating soft diet and having bowel function  -mobilize as able,continue PT - IS - stable for discharge from a surgical perspective once medically ready. Will arrange f/u and provide instructions.   FEN -soft diet VTE -Lovenox (prophlactic-renal dosing) ID -Cefotetan peri-op   Plan:  Home when medically stable. DC info in AVS.   LOS: 6 days    Clayton [name] 10/12/2018 [phone]

## 2018-10-12 NOTE — Progress Notes (Signed)
PROGRESS NOTE    Clayton [name]  JJH:[id number] DOB: 28-Jun-1945 DOA: 10/06/2018 PCP: [name], PA-C (Inactive)   Brief Narrative:   Clayton [place] Clayton [name] a 73 y.o.malewith medical history significant ofESRD on dialysis Monday Wednesday Friday, chronic systolic heart failure, diabetes mellitus, bilateral BKA, coronary artery disease, umbilical hernia presents today with abdominal pain for 2 to 3 weeks worsened over the last 24 hours. Patient denies any fever or chills denies headache shortness of breath chest pain back pain, dysuria. He reports persistent nausea and multiple episodes of nonbloody nonbilious vomiting. Abdominal pain is mostly around the umbilicus nonradiating and moderate to severe and constant.  Admitted for incarcerated umbilical hernia with SBO, now s/p umbilical hernia repair with surgery.     Assessment & Plan:   Active Problems:   ESRD (end stage renal disease) on dialysis (HCC)   Chronic systolic CHF (congestive heart failure), NYHA class 3 (HCC)   S/P bilateral BKA (below knee amputation) ([place])   Umbilical hernia, incarcerated   Pressure injury of skin   SBO (small bowel obstruction) ([place])   Incarcerated umbilical hernia with small bowel obstruction Patient presenting with progressive abdominal discomfort for 2-3 weeks with acute worsening over the past 24 hours prior to hospitalization.  CT abdomen/pelvis on admission notable for proximal small bowel distention with narrow necked umbilical hernia containing a single loop of small bowel in fluid with decompression of the distal small bowel and colon concerning for obstruction. --General surgery following, appreciate assistance --S/p umbilical hernia repair with phasix mesh on 5/24 --Continues with flatus and bowel movement yesterday --Continue soft diet, tolerating --PT for mobilization/out of bed to chair at least twice daily  ESRD on MWF dialysis  Hyperkalemia: --Nephrology following,  appreciate assistance --Potassium improved with HDtoday --Continue to follow potassium daily --HD per nephrology recommendations  Nonsustained ventricular tachycardia Patient was noted to have an 18 beat run of V. tach during bowel movements.  Patient was asymptomatic.  Rechecked magnesium level which was normal at 1.9. --Continue to monitor on telemetry --Daily electrolytes  Persistent nausea and vomiting: resolved --Continue antiemetics as needed  Chronic hypotension:  Systolic blood pressures in the mid 80s overnight.   Asymptomatic --Continue midodrine 10 mg p.o. 3 times daily --Continue to monitor blood pressure closely  Chronic systolic heart failure Patient appears compensated. Fluid management per HD  Elevated troponin: Lower than previous in setting of SBO and ESRD.  No c/o c/w ACS.   Possible cirrhosis noted on imaging Thrombocytopenia CT abdomen/pelvis with hepatic morphology suggestive of cirrhosis with ascites. LFTs within normal limits.  INR elevated to 1.6.  Platelets 108. --Avoid hepatotoxins --Will need further outpatient follow-up/surveillance  Possible Cirrhosis based on imaging: follow CMP, INR, CBC  S/p bilateral BKA  Patient reports currently lives at home alone with the assistance of home health aides.  PT recommends home health PT when ready for discharge.     DVT prophylaxis: Lovenox Code Status: Full code Family Communication: None - patient declined calling family Disposition Plan: PT recommends home health on discharge, anticipate discharge home tomorrow with home health services   Consultants:   General surgery  Nephrology  Procedures: umbilical hernia repair with phasix mesh on 5/24  Antimicrobials:   Perioperative cefotetan   Subjective: At bedside, just returned from dialysis today.  States feels a little weak to go home today.  Tolerating his soft diet without nausea/vomiting.  Having bowel movements. No other  complaints at this time.  Denies headache, no visual changes, no  chest pain, no palpitations, no shortness of breath, no abdominal pain, no weakness, no fever/chills/night sweats, no cough/congestion.  No other acute events overnight per nursing staff.  Objective: Vitals:   10/12/18 1000 10/12/18 1009 10/12/18 1114 10/12/18 1122  BP: (!) 73/47 (!) 81/50 (!) 85/52   Pulse: 73 70    Resp: (!) 23 20 (!) 23 20  Temp:  (!) 97.5 F (36.4 C)    TempSrc:  Oral    SpO2:  100%    Weight:  72.6 kg    Height:        Intake/Output Summary (Last 24 hours) at 10/12/2018 1340 Last data filed at 10/12/2018 1009 Gross per 24 hour  Intake 200 ml  Output 2500 ml  Net -2300 ml   Filed Weights   10/10/18 1147 10/12/18 0655 10/12/18 1009  Weight: 74.3 kg 75.1 kg 72.6 kg    Examination:  General exam: Appears calm and comfortable  Respiratory system: Clear to auscultation. Respiratory effort normal. Cardiovascular system: S1 & S2 heard, RRR. No JVD, murmurs, rubs, gallops or clicks. No pedal edema. Gastrointestinal system: Abdomen is nondistended, soft and nontender. No organomegaly or masses felt. Normal bowel sounds heard. Central nervous system: Alert and oriented. No focal neurological deficits. Extremities: Bilateral BKA's noted, moves all extremities x4 Skin: No rashes, lesions or ulcers Psychiatry: Judgement and insight appear normal. Mood & affect appropriate.     Data Reviewed: I have personally reviewed following labs and imaging studies  CBC: Recent Labs  Lab 10/07/18 0434 10/08/18 0335 10/08/18 0815 10/09/18 0156 10/12/18 0805  WBC 8.7 8.1 8.3 6.8 5.6  HGB 12.1* 11.6* 11.5* 10.8* 10.5*  HCT 38.7* 35.9* 35.9* 33.6* 32.3*  MCV 100.3* 98.9 99.2 99.7 97.6  PLT 95* 117* 125* 108* 073*   Basic Metabolic Panel: Recent Labs  Lab 10/08/18 2108 10/09/18 0156 10/10/18 0239 10/10/18 1527 10/11/18 0224 10/12/18 0805  NA 138 138 137  --  138 135  K 4.5 4.3 4.6  --  3.6 4.4  CL  100 100 102  --  99 95*  CO2 24 23 23   --  27 25  GLUCOSE 139* 115* 78  --  93 83  BUN 28* 27* 42*  --  21 33*  CREATININE 4.22* 4.22* 5.25*  --  3.59* 4.69*  CALCIUM 9.2 9.1 9.4  --  9.1 9.4  MG  --  1.9  --  1.9 2.0  --   PHOS  --   --   --   --   --  4.6   GFR: Estimated Creatinine Clearance: 12.4 mL/min (A) (by C-G formula based on SCr of 4.69 mg/dL (H)). Liver Function Tests: Recent Labs  Lab 10/06/18 1223 10/08/18 0536 10/09/18 0156 10/12/18 0805  AST 13* 10* 10*  --   ALT 11 8 8   --   ALKPHOS 147* 108 95  --   BILITOT 1.4* 0.9 0.6  --   PROT 9.1* 7.4 6.6  --   ALBUMIN 3.8 3.0* 2.6* 2.5*   Recent Labs  Lab 10/06/18 1223  LIPASE 30   No results for input(s): AMMONIA in the last 168 hours. Coagulation Profile: Recent Labs  Lab 10/08/18 0335 10/09/18 0156  INR 1.5* 1.6*   Cardiac Enzymes: Recent Labs  Lab 10/06/18 1223  TROPONINI 0.08*   BNP (last 3 results) No results for input(s): PROBNP in the last 8760 hours. HbA1C: No results for input(s): HGBA1C in the last 72 hours. CBG: Recent Labs  Lab 10/06/18 2052 10/10/18 1252  GLUCAP 122* 82   Lipid Profile: No results for input(s): CHOL, HDL, LDLCALC, TRIG, CHOLHDL, LDLDIRECT in the last 72 hours. Thyroid Function Tests: No results for input(s): TSH, T4TOTAL, FREET4, T3FREE, THYROIDAB in the last 72 hours. Anemia Panel: No results for input(s): VITAMINB12, FOLATE, FERRITIN, TIBC, IRON, RETICCTPCT in the last 72 hours. Sepsis Labs: Recent Labs  Lab 10/06/18 1223 10/08/18 1841 10/08/18 2108 10/09/18 0156  LATICACIDVEN 2.3* 2.7* 2.4* 2.4*    Recent Results (from the past 240 hour(s))  SARS Coronavirus 2 (CEPHEID - Performed in [address] hospital lab), Hosp Order     Status: None   Collection Time: 10/06/18 12:23 PM  Result Value Ref Range Status   SARS Coronavirus 2 NEGATIVE NEGATIVE Final    Comment: (NOTE) If result is NEGATIVE SARS-CoV-2 target nucleic acids are NOT DETECTED. The  SARS-CoV-2 RNA is generally detectable in upper and lower  respiratory specimens during the acute phase of infection. The lowest  concentration of SARS-CoV-2 viral copies this assay can detect is 250  copies / mL. A negative result does not preclude SARS-CoV-2 infection  and should not be used as the sole basis for treatment or other  patient management decisions.  A negative result may occur with  improper specimen collection / handling, submission of specimen other  than nasopharyngeal swab, presence of viral mutation(s) within the  areas targeted by this assay, and inadequate number of viral copies  (<250 copies / mL). A negative result must be combined with clinical  observations, patient history, and epidemiological information. If result is POSITIVE SARS-CoV-2 target nucleic acids are DETECTED. The SARS-CoV-2 RNA is generally detectable in upper and lower  respiratory specimens dur ing the acute phase of infection.  Positive  results are indicative of active infection with SARS-CoV-2.  Clinical  correlation with patient history and other diagnostic information is  necessary to determine patient infection status.  Positive results do  not rule out bacterial infection or co-infection with other viruses. If result is PRESUMPTIVE POSTIVE SARS-CoV-2 nucleic acids MAY BE PRESENT.   A presumptive positive result was obtained on the submitted specimen  and confirmed on repeat testing.  While 2019 novel coronavirus  (SARS-CoV-2) nucleic acids may be present in the submitted sample  additional confirmatory testing may be necessary for epidemiological  and / or clinical management purposes  to differentiate between  SARS-CoV-2 and other Sarbecovirus currently known to infect humans.  If clinically indicated additional testing with an alternate test  methodology [phone]) is advised. The SARS-CoV-2 RNA is generally  detectable in upper and lower respiratory sp ecimens during the acute   phase of infection. The expected result is Negative. Fact Sheet for Patients:  StrictlyIdeas.no Fact Sheet for Healthcare Providers: BankingDealers.co.za This test is not yet approved or cleared by the [place] FDA and has been authorized for detection and/or diagnosis of SARS-CoV-2 by FDA under an Emergency Use Authorization (EUA).  This EUA will remain in effect (meaning this test can be used) for the duration of the COVID-19 declaration under Section 564(b)(1) of the Act, 21 U.S.C. section 360bbb-3(b)(1), unless the authorization is terminated or revoked sooner. Performed at [facility], [place] [address], [place], [place] [address]          Radiology Studies: No results found.      Scheduled Meds: . acetaminophen  650 mg Oral Q6H  . Chlorhexidine Gluconate Cloth  6 each Topical Q0600  . docusate sodium  100 mg Oral BID  . enoxaparin (LOVENOX) injection  30 mg Subcutaneous Q24H  . feeding supplement (NEPRO CARB STEADY)  237 mL Oral BID BM  . midodrine  10 mg Oral TID WC  . multivitamin  1 tablet Oral QHS   Continuous Infusions:   LOS: 6 days    Time spent: 28 minutes     J [place] DO [facility] Pager [phone]  If 7PM-7AM, please contact night-coverage [email or web address] Password [email or web address] 10/12/2018, 1:40 PM

## 2018-10-12 NOTE — Progress Notes (Signed)
     Expand All Collapse All    [facility] Progress Note  Subjective: no c/o, seen in room, BP 80's. Still having nausea.         Vitals:   10/11/18 0551 10/11/18 0709 10/11/18 0959 10/11/18 1231  BP: (!) 66/45 (!) 78/54  (!) 68/53  Pulse:  69 (!) 50   Resp: 20 (!) 24 20 (!) 23  Temp: 98.1 F (36.7 C) 97.9 F (36.6 C)    TempSrc: Oral Oral    SpO2:  94% 93% 94%  Weight:      Height:        Inpatient medications: . acetaminophen  650 mg Oral Q6H  . Chlorhexidine Gluconate Cloth  6 each Topical Q0600  . Chlorhexidine Gluconate Cloth  6 each Topical Q0600  . docusate sodium  100 mg Oral BID  . enoxaparin (LOVENOX) injection  30 mg Subcutaneous Q24H  . feeding supplement (NEPRO CARB STEADY)  237 mL Oral BID BM  . midodrine  10 mg Oral TID WC  . multivitamin  1 tablet Oral QHS   diphenhydrAMINE **OR** diphenhydrAMINE, ondansetron **OR** ondansetron (ZOFRAN) IV, oxyCODONE, polyethylene glycol    Exam: Genalert chron ill appearing and pleasant No jvd or bruits Chest clear bilatno rales RRR no MRG Abd soft ntnd no mass or ascites +bs MSbilat BKA Extno LEedema Neuro is alert, Ox 3 , nf L FA AVF +bruit  Home meds: - midodrine 10 tid - insluin aspart ac ssi   MWF [place] 4h 72.5kg Heparin none L AVF   Assessment: 1. Incarb umbilical hernia w/ SBO - sp OR repair 5/24. Had sig BM's yest. Nausea today is an issue.  2. ESRD - usual HD MWF, on TTS schedule here for this week, will switch back to MWF this Monday. HD tomorrow.   3. Chronic hypotension - on midodrine, usual BP's 80's - 90's, drops into 60's - 70's on HD per patient. "It's been like this for years".  4. Volume - no edema, +2kg ,UF as tolerated 5. DM2 on insulin 6. Hx CHF/ AICD implant 7. Anemia ckd - Hb 10- 12 here, no need esa 8. Hyperkalemia - resolved   [facility] 10/11/2018, 2:48 PM

## 2018-10-13 LAB — GLUCOSE, CAPILLARY: Glucose-Capillary: 98 mg/dL (ref 70–99)

## 2018-10-13 NOTE — Progress Notes (Signed)
7 Days Post-Op      Subjective: Patient is tolerating his diet well.  Incision looks fine.  He has a little bit of abdominal swelling and reports having loose stools after eating.  Surgical sites healing nicely.  Objective: Vital signs in last 24 hours: Temp:  [97.5 F (36.4 C)-99.2 F (37.3 C)] 98 F (36.7 C) (05/31 0622) Pulse Rate:  [67-77] 75 (05/30 1619) Resp:  [18-28] 18 (05/31 0622) BP: (68-99)/(29-59) 89/59 (05/31 0622) SpO2:  [95 %-100 %] 95 % (05/30 1619) Weight:  [72.6 kg-75.1 kg] 72.6 kg (05/30 1009) Last BM Date: 10/11/18 Dialysis yesterday 2500 recorded Stool x1 Afebrile vital signs are stable No labs/no radiology studies this a.m.   Intake/Output from previous day: 05/30 0701 - 05/31 0700 In: -  Out: 2501 [Stool:1] Intake/Output this shift: Total I/O In: -  Out: 1 [Stool:1]  General appearance: alert, cooperative and no distress GI: soft, non-tender; bowel sounds normal; no masses,  no organomegaly and Some mild abdominal distention.  Tolerated diet well.  He reports some loose stools after eating.  Lab Results:  Recent Labs    10/12/18 0805  WBC 5.6  HGB 10.5*  HCT 32.3*  PLT 129*    BMET Recent Labs    10/11/18 0224 10/12/18 0805  NA 138 135  K 3.6 4.4  CL 99 95*  CO2 27 25  GLUCOSE 93 83  BUN 21 33*  CREATININE 3.59* 4.69*  CALCIUM 9.1 9.4   PT/INR No results for input(s): LABPROT, INR in the last 72 hours.  Recent Labs  Lab 10/06/18 1223 10/08/18 0536 10/09/18 0156 10/12/18 0805  AST 13* 10* 10*  --   ALT 11 8 8   --   ALKPHOS 147* 108 95  --   BILITOT 1.4* 0.9 0.6  --   PROT 9.1* 7.4 6.6  --   ALBUMIN 3.8 3.0* 2.6* 2.5*     Lipase     Component Value Date/Time   LIPASE 30 10/06/2018 1223     Medications: . acetaminophen  650 mg Oral Q6H  . Chlorhexidine Gluconate Cloth  6 each Topical Q0600  . docusate sodium  100 mg Oral BID  . enoxaparin (LOVENOX) injection  30 mg Subcutaneous Q24H  . feeding supplement  (NEPRO CARB STEADY)  237 mL Oral BID BM  . midodrine  10 mg Oral TID WC  . multivitamin  1 tablet Oral QHS    Assessment/Plan DM CAD CHF ESRD on HD (M/W/F)  Hypotension - fluid management per primary team and nephrology Hyperkalemia -resolved after HD  Incarcerated recurrent umbilical hernia S/pUmbilical hernia repair with Phasixmesh- Dr. Donnie Mesa - 10/06/2018 - POD #7 -tolerating soft diet and having bowel function -mobilize as able,continue PT - IS - stable for discharge from a surgical perspective once medically ready. Will arrange f/u and provide instructions.  FEN -soft diet VTE -Lovenox (prophlactic-renal dosing) ID -Cefotetan peri-op Follow-up: Dr. Georgette [place] info is in the AVS.  Stable from a surgical standpoint.  Follow-up for his hernia repairs in the AVS.  LOS: 7 days    Clayton [name] 10/13/2018 [phone]

## 2018-10-13 NOTE — TOC Transition Note (Signed)
Transition of Care [facility]) - CM/SW Discharge Note   Patient Details  Name: Clayton [name] MRN: [id number] Date of Birth: 09-Nov-1945  [facility]) CM/SW Contact:  Carles Collet, RN Phone Number: 10/13/2018, 10:25 AM   Clinical Narrative:    Patient to DC to home today.  [place] notified Tommi Rumps of [place] today DME- no orders    Final next level of care: Home w Home Health Services Barriers to Discharge: No Barriers Identified   Patient Goals and CMS Choice Patient states their goals for this hospitalization and ongoing recovery are:: to get out as soon as i can CMS Medicare.gov Compare Post Acute Care list provided to:: Patient Choice offered to / list presented to : Patient  Discharge Placement                       Discharge Plan and Services In-house Referral: NA Discharge Planning Services: CM Consult Post Acute Care Choice: Home Health          DME Arranged: N/A DME Agency: NA       HH Arranged: RN, PT, Nurse's Aide [place] Agency: [place] Date [place]: 10/13/18 Time [place]: 1025 Representative spoke with at [place]: Guy ([place]) Interventions     Readmission Risk Interventions No flowsheet data found.

## 2018-10-13 NOTE — Discharge Summary (Signed)
Physician Discharge Summary  Clayton [name] DGU:[id number] DOB: Mar 05, 1946 DOA: 10/06/2018  PCP: [name], PA-C (Inactive)  Admit date: 10/06/2018 Discharge date: 10/13/2018  Admitted From: Home Disposition:  Home  Recommendations for Outpatient Follow-up:  1. Follow up with PCP in 1 week 2. Follow-up with general surgery as scheduled 3. Consideration of GI referral outpatient for further surveillance of likely underlying cirrhosis   Home Health: Yes, home health PT/RN/aide Equipment/Devices: none  Discharge Condition: Stable CODE STATUS: Full code Diet recommendation: Renal diet  History of present illness:  Clayton Fatima Brooksis a 73 y.o.malewith medical history significant ofESRD on dialysis Monday Wednesday Friday, chronic systolic heart failure, diabetes mellitus, bilateral BKA, coronary artery disease, umbilical hernia presents today with abdominal pain for 2 to 3 weeks worsened over the last 24 hours. Patient denies any fever or chills denies headache shortness of breath chest pain back pain, dysuria. He reports persistent nausea and multiple episodes of nonbloody nonbilious vomiting. Abdominal pain is mostly around the umbilicus nonradiating and moderate to severe and constant.  Admitted for incarcerated umbilical hernia with SBO, now s/p umbilical hernia repair with surgery.  Hospital course:  Incarcerated umbilical hernia with small bowel obstruction Patient presenting with progressive abdominal discomfort for 2-3 weeks with acute worsening over the past 24 hours prior to hospitalization.  CT abdomen/pelvis on admission notable for proximal small bowel distention with narrow necked umbilical hernia containing a single loop of small bowel in fluid with decompression of the distal small bowel and colon concerning for obstruction.  General surgery was consulted and patient underwent umbilical hernia repair with phasix mesh on 5/24.  His diet was cautiously advanced  with progressive toleration and return to normal bowel function.  Patient has follow-up scheduled with Dr. Gershon Crane general surgery outpatient following hospitalization.  ESRD on MWF dialysis  Hyperkalemia: Followed was consulted and followed during hospital course.  He was continued on hemodialysis.  Patient will resume his normal hemodialysis following discharge on a Monday Wednesday Friday schedule.  Chronic hypotension: Continue home midodrine  Chronic systolic heart failure Patient appears compensated. Fluid management per HD  Elevated troponin: Lower than previous in setting of SBO and ESRD. No c/o c/w ACS.   Possible cirrhosis noted on imaging Thrombocytopenia CT abdomen/pelvis with hepatic morphology suggestive of cirrhosis with ascites. LFTs within normal limits.  INR elevated to 1.6.  Platelets 108. Avoid hepatotoxins. Will need further outpatient follow-up/surveillance, consideration of GI referral.  S/p bilateral BKA  Patient reports currently lives at home alone with the assistance of home health aides.  PT recommends home health PT when ready for discharge.    Discharge Diagnoses:  Active Problems:   ESRD (end stage renal disease) on dialysis (HCC)   Chronic systolic CHF (congestive heart failure), NYHA class 3 (HCC)   S/P bilateral BKA (below knee amputation) (HCC)   Pressure injury of skin    Discharge Instructions  Discharge Instructions    Call MD for:  difficulty breathing, headache or visual disturbances   Complete by:  As directed    Call MD for:  extreme fatigue   Complete by:  As directed    Call MD for:  persistant dizziness or light-headedness   Complete by:  As directed    Call MD for:  persistant nausea and vomiting   Complete by:  As directed    Call MD for:  redness, tenderness, or signs of infection (pain, swelling, redness, odor or green/yellow discharge around incision site)   Complete by:  As directed    Call MD for:  severe  uncontrolled pain   Complete by:  As directed    Call MD for:  temperature >100.4   Complete by:  As directed    Diet - low sodium heart healthy   Complete by:  As directed    Increase activity slowly   Complete by:  As directed      Allergies as of 10/13/2018      Reactions   Simvastatin Other (See Comments)   unknown      Medication List    TAKE these medications   cinacalcet 60 MG tablet Commonly known as:  SENSIPAR Take 60 mg by mouth daily.   feeding supplement Liqd Take 1 Container by mouth 3 (three) times daily between meals.   gabapentin 100 MG capsule Commonly known as:  NEURONTIN Take 100 mg by mouth at bedtime.   Melatonin 3 MG Tabs Take 3 mg by mouth at bedtime as needed (sleep).   midodrine 5 MG tablet Commonly known as:  PROAMATINE Take 15 mg by mouth 3 (three) times daily with meals. What changed:  Another medication with the same name was removed. Continue taking this medication, and follow the directions you see here.   pravastatin 10 MG tablet Commonly known as:  PRAVACHOL Take 5 mg by mouth at bedtime.   Refresh Tears 0.5 % Soln Generic drug:  carboxymethylcellulose Place 1 drop into both eyes 2 (two) times a day.   sevelamer carbonate 800 MG tablet Commonly known as:  RENVELA Take 1,600 mg by mouth 3 (three) times daily with meals.      Follow-up Information    [facility] Follow up.   Specialty:  Home Health Services Why:  home health services arranged Contact information: [place] [address] [phone]        Donnie Mesa, MD. Go on 11/01/2018.   Specialty:  General Surgery Why:  Follow up appointment scheduled for 9:50 AM. Please arrive 30 min prior to appointment time. Bring photo ID and insurance information.  Contact information: [address] [address] [phone]          Allergies  Allergen Reactions  . Simvastatin Other (See Comments)     unknown    Consultations:  General surgery  Nephrology   Procedures/Studies: Ct Abdomen Pelvis W Contrast  Result Date: 10/06/2018 CLINICAL DATA:  Incarcerated hernia EXAM: CT ABDOMEN AND PELVIS WITH CONTRAST TECHNIQUE: Multidetector CT imaging of the abdomen and pelvis was performed using the standard protocol following bolus administration of intravenous contrast. CONTRAST:  157mL OMNIPAQUE IOHEXOL 300 MG/ML  SOLN COMPARISON:  None. FINDINGS: Lower chest: Small left pleural effusion. Bibasilar atelectasis or consolidation. Cardiomegaly Hepatobiliary: Shrunken and somewhat coarse appearing liver. Small gallstones. No gallbladder wall thickening, or biliary dilatation. Pancreas: Unremarkable. No pancreatic ductal dilatation or surrounding inflammatory changes. Spleen: Normal in size without focal abnormality. Adrenals/Urinary Tract: Adrenal glands are unremarkable. Very atrophic kidneys. Bladder is unremarkable. Stomach/Bowel: The stomach is grossly fluid distended. The proximal small bowel is mildly distended, largest loops measuring 3.9 cm. There is a narrow necked umbilical hernia containing a single loop of mid small bowel and fluid, with decompression of the distal small bowel and colon. Vascular/Lymphatic: Mixed calcific atherosclerosis. No enlarged abdominal or pelvic lymph nodes. Reproductive: Prostatomegaly. Other: No abdominal wall hernia or abnormality. Large volume ascites. Musculoskeletal: Renal osteodystrophy. IMPRESSION: 1. The stomach is grossly fluid distended. The proximal small  bowel is mildly distended, largest loops measuring 3.9 cm. There is a narrow necked umbilical hernia containing a single loop of mid small bowel and fluid, with decompression of the distal small bowel and colon. Findings are concerning for obstruction at the level of an incarcerated hernia. 2.  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 3.  Other chronic and incidental findings as detailed above.  Electronically Signed   By: Eddie Candle M.D.   On: 10/06/2018 15:34   Dg Chest Port 1 View  Result Date: 10/06/2018 CLINICAL DATA:  Pt states mid chest pains with nANDv x 5 days, hx pacemaker/defib, diabetes, non smoker EXAM: PORTABLE CHEST 1 VIEW COMPARISON:  Chest x-rays dated 07/07/2014 and 06/30/2014. FINDINGS: Stable cardiomegaly. LEFT chest wall pacemaker/ICD leads appear stable in position. Lungs are clear. No pleural effusion or pneumothorax seen. Osseous structures about the chest are unremarkable. IMPRESSION: No active disease. No evidence of pneumonia or pulmonary edema. Stable cardiomegaly. Electronically Signed   By: Franki Cabot M.D.   On: 10/06/2018 12:59     Subjective: Patient seen and examined at bedside, doing well, eating breakfast.  Tolerating diet.  No complaints this morning.  Ready for discharge home.  Denies headache, no fever/chills/night sweats, no nausea/vomiting/diarrhea, no chest pain, no palpitations, no abdominal pain, no cough/congestion.  No acute events overnight per nursing staff.   Discharge Exam: Vitals:   10/13/18 0622 10/13/18 0818  BP: (!) 89/59   Pulse:  73  Resp: 18 20  Temp: 98 F (36.7 C) 97.8 F (36.6 C)  SpO2:  96%   Vitals:   10/13/18 0018 10/13/18 0405 10/13/18 0622 10/13/18 0818  BP: (!) 86/57  (!) 89/59   Pulse:    73  Resp: (!) 28  18 20   Temp: 99.2 F (37.3 C) 98 F (36.7 C) 98 F (36.7 C) 97.8 F (36.6 C)  TempSrc: Oral Oral Oral Oral  SpO2:    96%  Weight:      Height:        General: Pt is alert, awake, not in acute distress Cardiovascular: RRR, S1/S2 +, no rubs, no gallops Respiratory: CTA bilaterally, no wheezing, no rhonchi Abdominal: Soft, NT, ND, bowel sounds + Extremities: Bilateral BKA's noted, no edema    The results of significant diagnostics from this hospitalization (including imaging, microbiology, ancillary and laboratory) are listed below for reference.     Microbiology: Recent Results (from the  past 240 hour(s))  SARS Coronavirus 2 (CEPHEID - Performed in [place] hospital lab), Hosp Order     Status: None   Collection Time: 10/06/18 12:23 PM  Result Value Ref Range Status   SARS Coronavirus 2 NEGATIVE NEGATIVE Final    Comment: (NOTE) If result is NEGATIVE SARS-CoV-2 target nucleic acids are NOT DETECTED. The SARS-CoV-2 RNA is generally detectable in upper and lower  respiratory specimens during the acute phase of infection. The lowest  concentration of SARS-CoV-2 viral copies this assay can detect is 250  copies / mL. A negative result does not preclude SARS-CoV-2 infection  and should not be used as the sole basis for treatment or other  patient management decisions.  A negative result may occur with  improper specimen collection / handling, submission of specimen other  than nasopharyngeal swab, presence of viral mutation(s) within the  areas targeted by this assay, and inadequate number of viral copies  (<250 copies / mL). A negative result must be combined with clinical  observations, patient history, and epidemiological information.  If result is POSITIVE SARS-CoV-2 target nucleic acids are DETECTED. The SARS-CoV-2 RNA is generally detectable in upper and lower  respiratory specimens dur ing the acute phase of infection.  Positive  results are indicative of active infection with SARS-CoV-2.  Clinical  correlation with patient history and other diagnostic information is  necessary to determine patient infection status.  Positive results do  not rule out bacterial infection or co-infection with other viruses. If result is PRESUMPTIVE POSTIVE SARS-CoV-2 nucleic acids MAY BE PRESENT.   A presumptive positive result was obtained on the submitted specimen  and confirmed on repeat testing.  While 2019 novel coronavirus  (SARS-CoV-2) nucleic acids may be present in the submitted sample  additional confirmatory testing may be necessary for epidemiological  and / or  clinical management purposes  to differentiate between  SARS-CoV-2 and other Sarbecovirus currently known to infect humans.  If clinically indicated additional testing with an alternate test  methodology [phone]) is advised. The SARS-CoV-2 RNA is generally  detectable in upper and lower respiratory sp ecimens during the acute  phase of infection. The expected result is Negative. Fact Sheet for Patients:  StrictlyIdeas.no Fact Sheet for Healthcare Providers: BankingDealers.co.za This test is not yet approved or cleared by the [place] FDA and has been authorized for detection and/or diagnosis of SARS-CoV-2 by FDA under an Emergency Use Authorization (EUA).  This EUA will remain in effect (meaning this test can be used) for the duration of the COVID-19 declaration under Section 564(b)(1) of the Act, 21 U.S.C. section 360bbb-3(b)(1), unless the authorization is terminated or revoked sooner. Performed at [facility], [place] [address], [place], [place] [address]      Labs: BNP (last 3 results) No results for input(s): BNP in the last 8760 hours. Basic Metabolic Panel: Recent Labs  Lab 10/08/18 2108 10/09/18 0156 10/10/18 0239 10/10/18 1527 10/11/18 0224 10/12/18 0805  NA 138 138 137  --  138 135  K 4.5 4.3 4.6  --  3.6 4.4  CL 100 100 102  --  99 95*  CO2 24 23 23   --  27 25  GLUCOSE 139* 115* 78  --  93 83  BUN 28* 27* 42*  --  21 33*  CREATININE 4.22* 4.22* 5.25*  --  3.59* 4.69*  CALCIUM 9.2 9.1 9.4  --  9.1 9.4  MG  --  1.9  --  1.9 2.0  --   PHOS  --   --   --   --   --  4.6   Liver Function Tests: Recent Labs  Lab 10/06/18 1223 10/08/18 0536 10/09/18 0156 10/12/18 0805  AST 13* 10* 10*  --   ALT 11 8 8   --   ALKPHOS 147* 108 95  --   BILITOT 1.4* 0.9 0.6  --   PROT 9.1* 7.4 6.6  --   ALBUMIN 3.8 3.0* 2.6* 2.5*   Recent Labs  Lab 10/06/18 1223  LIPASE 30   No results for input(s): AMMONIA in the  last 168 hours. CBC: Recent Labs  Lab 10/07/18 0434 10/08/18 0335 10/08/18 0815 10/09/18 0156 10/12/18 0805  WBC 8.7 8.1 8.3 6.8 5.6  HGB 12.1* 11.6* 11.5* 10.8* 10.5*  HCT 38.7* 35.9* 35.9* 33.6* 32.3*  MCV 100.3* 98.9 99.2 99.7 97.6  PLT 95* 117* 125* 108* 129*   Cardiac Enzymes: Recent Labs  Lab 10/06/18 1223  TROPONINI 0.08*   BNP: Invalid input(s): POCBNP CBG: Recent Labs  Lab 10/06/18 2052  10/10/18 1252  GLUCAP 122* 82   D-Dimer No results for input(s): DDIMER in the last 72 hours. Hgb A1c No results for input(s): HGBA1C in the last 72 hours. Lipid Profile No results for input(s): CHOL, HDL, LDLCALC, TRIG, CHOLHDL, LDLDIRECT in the last 72 hours. Thyroid function studies No results for input(s): TSH, T4TOTAL, T3FREE, THYROIDAB in the last 72 hours.  Invalid input(s): FREET3 Anemia work up No results for input(s): VITAMINB12, FOLATE, FERRITIN, TIBC, IRON, RETICCTPCT in the last 72 hours. Urinalysis No results found for: COLORURINE, APPEARANCEUR, [place], [place], GLUCOSEU, [place], [place], [place], PROTEINUR, UROBILINOGEN, NITRITE, LEUKOCYTESUR Sepsis Labs Invalid input(s): PROCALCITONIN,  WBC,  LACTICIDVEN Microbiology Recent Results (from the past 240 hour(s))  SARS Coronavirus 2 (CEPHEID - Performed in [place] hospital lab), Hosp Order     Status: None   Collection Time: 10/06/18 12:23 PM  Result Value Ref Range Status   SARS Coronavirus 2 NEGATIVE NEGATIVE Final    Comment: (NOTE) If result is NEGATIVE SARS-CoV-2 target nucleic acids are NOT DETECTED. The SARS-CoV-2 RNA is generally detectable in upper and lower  respiratory specimens during the acute phase of infection. The lowest  concentration of SARS-CoV-2 viral copies this assay can detect is 250  copies / mL. A negative result does not preclude SARS-CoV-2 infection  and should not be used as the sole basis for treatment or other  patient management decisions.  A negative result may  occur with  improper specimen collection / handling, submission of specimen other  than nasopharyngeal swab, presence of viral mutation(s) within the  areas targeted by this assay, and inadequate number of viral copies  (<250 copies / mL). A negative result must be combined with clinical  observations, patient history, and epidemiological information. If result is POSITIVE SARS-CoV-2 target nucleic acids are DETECTED. The SARS-CoV-2 RNA is generally detectable in upper and lower  respiratory specimens dur ing the acute phase of infection.  Positive  results are indicative of active infection with SARS-CoV-2.  Clinical  correlation with patient history and other diagnostic information is  necessary to determine patient infection status.  Positive results do  not rule out bacterial infection or co-infection with other viruses. If result is PRESUMPTIVE POSTIVE SARS-CoV-2 nucleic acids MAY BE PRESENT.   A presumptive positive result was obtained on the submitted specimen  and confirmed on repeat testing.  While 2019 novel coronavirus  (SARS-CoV-2) nucleic acids may be present in the submitted sample  additional confirmatory testing may be necessary for epidemiological  and / or clinical management purposes  to differentiate between  SARS-CoV-2 and other Sarbecovirus currently known to infect humans.  If clinically indicated additional testing with an alternate test  methodology [phone]) is advised. The SARS-CoV-2 RNA is generally  detectable in upper and lower respiratory sp ecimens during the acute  phase of infection. The expected result is Negative. Fact Sheet for Patients:  StrictlyIdeas.no Fact Sheet for Healthcare Providers: BankingDealers.co.za This test is not yet approved or cleared by the [place] FDA and has been authorized for detection and/or diagnosis of SARS-CoV-2 by FDA under an Emergency Use Authorization (EUA).  This  EUA will remain in effect (meaning this test can be used) for the duration of the COVID-19 declaration under Section 564(b)(1) of the Act, 21 U.S.C. section 360bbb-3(b)(1), unless the authorization is terminated or revoked sooner. Performed at [facility], [place] [address], [place], [place] [address]      Time coordinating discharge: Over 30 minutes  SIGNED:  Collyn Ribas J [place] DO  [facility] 10/13/2018, 10:12 AM

## 2018-10-13 NOTE — Plan of Care (Signed)

## 2018-10-17 NOTE — Anesthesia Postprocedure Evaluation (Signed)
Anesthesia Post Note  Patient: Clayton [name]  Procedure(s) Performed: HERNIA REPAIR INCISIONAL (N/A Abdomen)     Patient location during evaluation: PACU Anesthesia Type: General Level of consciousness: awake and patient cooperative Pain management: pain level controlled Vital Signs Assessment: post-procedure vital signs reviewed and stable Respiratory status: spontaneous breathing, nonlabored ventilation, respiratory function stable and patient connected to nasal cannula oxygen Cardiovascular status: blood pressure returned to baseline and stable Postop Assessment: no apparent nausea or vomiting Anesthetic complications: no    Last Vitals:  Vitals:   10/13/18 0818 10/13/18 1210  BP:  97/68  Pulse: 73 70  Resp: 20 19  Temp: 36.6 C 36.4 C  SpO2: 96% 100%    Last Pain:  Vitals:   10/13/18 1210  TempSrc: Oral  PainSc:                  Georgi Tuel

## 2019-08-18 ENCOUNTER — Encounter (HOSPITAL_COMMUNITY): Payer: Self-pay | Admitting: Emergency Medicine

## 2019-08-18 ENCOUNTER — Inpatient Hospital Stay (HOSPITAL_COMMUNITY)
Admission: EM | Admit: 2019-08-18 | Discharge: 2019-08-25 | DRG: 871 | Disposition: A | Discharge status: Home / Self Care | Payer: Medicare Other | Attending: Internal Medicine | Admitting: Internal Medicine

## 2019-08-18 ENCOUNTER — Emergency Department (HOSPITAL_COMMUNITY): Payer: Medicare Other

## 2019-08-18 DIAGNOSIS — N2581 Secondary hyperparathyroidism of renal origin: Secondary | ICD-10-CM | POA: Diagnosis present

## 2019-08-18 DIAGNOSIS — Z992 Dependence on renal dialysis: Secondary | ICD-10-CM | POA: Diagnosis not present

## 2019-08-18 DIAGNOSIS — Z8249 Family history of ischemic heart disease and other diseases of the circulatory system: Secondary | ICD-10-CM

## 2019-08-18 DIAGNOSIS — I9589 Other hypotension: Secondary | ICD-10-CM | POA: Diagnosis present

## 2019-08-18 DIAGNOSIS — A419 Sepsis, unspecified organism: Principal | ICD-10-CM | POA: Diagnosis present

## 2019-08-18 DIAGNOSIS — D849 Immunodeficiency, unspecified: Secondary | ICD-10-CM | POA: Diagnosis present

## 2019-08-18 DIAGNOSIS — E861 Hypovolemia: Secondary | ICD-10-CM | POA: Diagnosis present

## 2019-08-18 DIAGNOSIS — Z515 Encounter for palliative care: Secondary | ICD-10-CM

## 2019-08-18 DIAGNOSIS — N186 End stage renal disease: Secondary | ICD-10-CM | POA: Diagnosis present

## 2019-08-18 DIAGNOSIS — I5082 Biventricular heart failure: Secondary | ICD-10-CM | POA: Diagnosis present

## 2019-08-18 DIAGNOSIS — E860 Dehydration: Secondary | ICD-10-CM | POA: Diagnosis present

## 2019-08-18 DIAGNOSIS — Z20822 Contact with and (suspected) exposure to covid-19: Secondary | ICD-10-CM | POA: Diagnosis present

## 2019-08-18 DIAGNOSIS — R4589 Other symptoms and signs involving emotional state: Secondary | ICD-10-CM

## 2019-08-18 DIAGNOSIS — I5022 Chronic systolic (congestive) heart failure: Secondary | ICD-10-CM | POA: Diagnosis present

## 2019-08-18 DIAGNOSIS — R579 Shock, unspecified: Secondary | ICD-10-CM

## 2019-08-18 DIAGNOSIS — J9601 Acute respiratory failure with hypoxia: Secondary | ICD-10-CM | POA: Diagnosis present

## 2019-08-18 DIAGNOSIS — I5020 Unspecified systolic (congestive) heart failure: Secondary | ICD-10-CM | POA: Diagnosis not present

## 2019-08-18 DIAGNOSIS — G9341 Metabolic encephalopathy: Secondary | ICD-10-CM | POA: Diagnosis not present

## 2019-08-18 DIAGNOSIS — D631 Anemia in chronic kidney disease: Secondary | ICD-10-CM | POA: Diagnosis present

## 2019-08-18 DIAGNOSIS — Z89512 Acquired absence of left leg below knee: Secondary | ICD-10-CM

## 2019-08-18 DIAGNOSIS — R570 Cardiogenic shock: Secondary | ICD-10-CM | POA: Diagnosis not present

## 2019-08-18 DIAGNOSIS — D696 Thrombocytopenia, unspecified: Secondary | ICD-10-CM | POA: Diagnosis present

## 2019-08-18 DIAGNOSIS — Z79899 Other long term (current) drug therapy: Secondary | ICD-10-CM

## 2019-08-18 DIAGNOSIS — I132 Hypertensive heart and chronic kidney disease with heart failure and with stage 5 chronic kidney disease, or end stage renal disease: Secondary | ICD-10-CM | POA: Diagnosis present

## 2019-08-18 DIAGNOSIS — Z7189 Other specified counseling: Secondary | ICD-10-CM

## 2019-08-18 DIAGNOSIS — K0889 Other specified disorders of teeth and supporting structures: Secondary | ICD-10-CM | POA: Diagnosis present

## 2019-08-18 DIAGNOSIS — R197 Diarrhea, unspecified: Secondary | ICD-10-CM

## 2019-08-18 DIAGNOSIS — I493 Ventricular premature depolarization: Secondary | ICD-10-CM | POA: Diagnosis not present

## 2019-08-18 DIAGNOSIS — Z9115 Patient's noncompliance with renal dialysis: Secondary | ICD-10-CM | POA: Diagnosis not present

## 2019-08-18 DIAGNOSIS — I2510 Atherosclerotic heart disease of native coronary artery without angina pectoris: Secondary | ICD-10-CM | POA: Diagnosis present

## 2019-08-18 DIAGNOSIS — E1151 Type 2 diabetes mellitus with diabetic peripheral angiopathy without gangrene: Secondary | ICD-10-CM | POA: Diagnosis present

## 2019-08-18 DIAGNOSIS — Z66 Do not resuscitate: Secondary | ICD-10-CM

## 2019-08-18 DIAGNOSIS — E43 Unspecified severe protein-calorie malnutrition: Secondary | ICD-10-CM | POA: Insufficient documentation

## 2019-08-18 DIAGNOSIS — I361 Nonrheumatic tricuspid (valve) insufficiency: Secondary | ICD-10-CM | POA: Diagnosis not present

## 2019-08-18 DIAGNOSIS — Z955 Presence of coronary angioplasty implant and graft: Secondary | ICD-10-CM

## 2019-08-18 DIAGNOSIS — R6521 Severe sepsis with septic shock: Secondary | ICD-10-CM | POA: Diagnosis present

## 2019-08-18 DIAGNOSIS — R0902 Hypoxemia: Secondary | ICD-10-CM

## 2019-08-18 DIAGNOSIS — A09 Infectious gastroenteritis and colitis, unspecified: Secondary | ICD-10-CM | POA: Diagnosis present

## 2019-08-18 DIAGNOSIS — Z833 Family history of diabetes mellitus: Secondary | ICD-10-CM

## 2019-08-18 DIAGNOSIS — Z888 Allergy status to other drugs, medicaments and biological substances status: Secondary | ICD-10-CM

## 2019-08-18 DIAGNOSIS — E1122 Type 2 diabetes mellitus with diabetic chronic kidney disease: Secondary | ICD-10-CM | POA: Diagnosis present

## 2019-08-18 DIAGNOSIS — I429 Cardiomyopathy, unspecified: Secondary | ICD-10-CM | POA: Diagnosis present

## 2019-08-18 DIAGNOSIS — Z95810 Presence of automatic (implantable) cardiac defibrillator: Secondary | ICD-10-CM

## 2019-08-18 DIAGNOSIS — E1140 Type 2 diabetes mellitus with diabetic neuropathy, unspecified: Secondary | ICD-10-CM | POA: Diagnosis present

## 2019-08-18 DIAGNOSIS — Z89511 Acquired absence of right leg below knee: Secondary | ICD-10-CM

## 2019-08-18 LAB — CBC WITH DIFFERENTIAL/PLATELET
Abs Immature Granulocytes: 0.14 K/uL — ABNORMAL HIGH (ref 0.00–0.07)
Basophils Absolute: 0.1 K/uL (ref 0.0–0.1)
Basophils Relative: 0 %
Eosinophils Absolute: 0 K/uL (ref 0.0–0.5)
Eosinophils Relative: 0 %
HCT: 35.1 % — ABNORMAL LOW (ref 39.0–52.0)
Hemoglobin: 11.1 g/dL — ABNORMAL LOW (ref 13.0–17.0)
Immature Granulocytes: 1 %
Lymphocytes Relative: 3 %
Lymphs Abs: 0.4 K/uL — ABNORMAL LOW (ref 0.7–4.0)
MCH: 32.4 pg (ref 26.0–34.0)
MCHC: 31.6 g/dL (ref 30.0–36.0)
MCV: 102.3 fL — ABNORMAL HIGH (ref 80.0–100.0)
Monocytes Absolute: 0.5 K/uL (ref 0.1–1.0)
Monocytes Relative: 3 %
Neutro Abs: 13.1 K/uL — ABNORMAL HIGH (ref 1.7–7.7)
Neutrophils Relative %: 93 %
Platelets: 109 K/uL — ABNORMAL LOW (ref 150–400)
RBC: 3.43 MIL/uL — ABNORMAL LOW (ref 4.22–5.81)
RDW: 15.5 % (ref 11.5–15.5)
WBC: 14.1 K/uL — ABNORMAL HIGH (ref 4.0–10.5)
nRBC: 0 % (ref 0.0–0.2)

## 2019-08-18 LAB — COMPREHENSIVE METABOLIC PANEL
ALT: 21 U/L (ref 0–44)
AST: 29 U/L (ref 15–41)
Albumin: 2.9 g/dL — ABNORMAL LOW (ref 3.5–5.0)
Alkaline Phosphatase: 135 U/L — ABNORMAL HIGH (ref 38–126)
Anion gap: 23 — ABNORMAL HIGH (ref 5–15)
BUN: 66 mg/dL — ABNORMAL HIGH (ref 8–23)
CO2: 17 mmol/L — ABNORMAL LOW (ref 22–32)
Calcium: 8.3 mg/dL — ABNORMAL LOW (ref 8.9–10.3)
Chloride: 98 mmol/L (ref 98–111)
Creatinine, Ser: 8.52 mg/dL — ABNORMAL HIGH (ref 0.61–1.24)
GFR calc Af Amer: 7 mL/min — ABNORMAL LOW (ref >60)
GFR calc non Af Amer: 6 mL/min — ABNORMAL LOW (ref >60)
Glucose, Bld: 82 mg/dL (ref 70–99)
Potassium: 4.2 mmol/L (ref 3.5–5.1)
Sodium: 138 mmol/L (ref 135–145)
Total Bilirubin: 1.8 mg/dL — ABNORMAL HIGH (ref 0.3–1.2)
Total Protein: 7.8 g/dL (ref 6.5–8.1)

## 2019-08-18 LAB — MRSA PCR SCREENING: MRSA by PCR: NEGATIVE

## 2019-08-18 LAB — LACTIC ACID, PLASMA
Lactic Acid, Venous: 5.1 mmol/L (ref 0.5–1.9)
Lactic Acid, Venous: 3 mmol/L (ref 0.5–1.9)

## 2019-08-18 LAB — CORTISOL: Cortisol, Plasma: 24 ug/dL

## 2019-08-18 LAB — PROTIME-INR
INR: 1.8 — ABNORMAL HIGH (ref 0.8–1.2)
Prothrombin Time: 20.5 seconds — ABNORMAL HIGH (ref 11.4–15.2)

## 2019-08-18 LAB — APTT: aPTT: 40 seconds — ABNORMAL HIGH (ref 24–36)

## 2019-08-18 LAB — RESPIRATORY PANEL BY RT PCR (FLU A&B, COVID)
Influenza A by PCR: NEGATIVE
Influenza B by PCR: NEGATIVE
SARS Coronavirus 2 by RT PCR: NEGATIVE

## 2019-08-18 LAB — LIPASE, BLOOD: Lipase: 26 U/L (ref 11–51)

## 2019-08-18 MED ORDER — CINACALCET HCL 30 MG PO TABS
60.0000 mg | ORAL_TABLET | Freq: Every day | ORAL | Status: DC
  Administered 2019-08-19 – 2019-08-23 (×5): 60 mg via ORAL
  Filled 2019-08-18 (×5): qty 2

## 2019-08-18 MED ORDER — LATANOPROST 0.005 % OP SOLN
1.0000 [drp] | Freq: Every day | OPHTHALMIC | Status: DC
  Administered 2019-08-18 – 2019-08-24 (×7): 1 [drp] via OPHTHALMIC
  Filled 2019-08-18 (×2): qty 2.5

## 2019-08-18 MED ORDER — GABAPENTIN 100 MG PO CAPS
100.0000 mg | ORAL_CAPSULE | Freq: Every day | ORAL | Status: DC
  Administered 2019-08-18 – 2019-08-24 (×7): 100 mg via ORAL
  Filled 2019-08-18 (×7): qty 1

## 2019-08-18 MED ORDER — RENA-VITE PO TABS
1.0000 | ORAL_TABLET | Freq: Every day | ORAL | Status: DC
  Administered 2019-08-18 – 2019-08-24 (×7): 1 via ORAL
  Filled 2019-08-18 (×8): qty 1

## 2019-08-18 MED ORDER — MIDODRINE HCL 5 MG PO TABS
15.0000 mg | ORAL_TABLET | ORAL | Status: AC
  Administered 2019-08-18: 15 mg via ORAL
  Filled 2019-08-18: qty 3

## 2019-08-18 MED ORDER — SODIUM CHLORIDE 0.9 % IV SOLN: INTRAVENOUS | Status: DC

## 2019-08-18 MED ORDER — MIDODRINE HCL 5 MG PO TABS
15.0000 mg | ORAL_TABLET | Freq: Three times a day (TID) | ORAL | Status: DC
  Administered 2019-08-18 – 2019-08-21 (×8): 15 mg via ORAL
  Filled 2019-08-18 (×9): qty 3

## 2019-08-18 MED ORDER — ORAL CARE MOUTH RINSE: 15.0000 mL | Freq: Two times a day (BID) | OROMUCOSAL | Status: DC

## 2019-08-18 MED ORDER — SODIUM CHLORIDE 0.9 % IV BOLUS (SEPSIS)
1000.0000 mL | Freq: Once | INTRAVENOUS | Status: AC
  Administered 2019-08-18: 1000 mL via INTRAVENOUS

## 2019-08-18 MED ORDER — VANCOMYCIN HCL IN DEXTROSE 1-5 GM/200ML-% IV SOLN
1000.0000 mg | Freq: Once | INTRAVENOUS | Status: AC
  Administered 2019-08-18: 1000 mg via INTRAVENOUS
  Filled 2019-08-18: qty 200

## 2019-08-18 MED ORDER — PANTOPRAZOLE SODIUM 40 MG IV SOLR
40.0000 mg | INTRAVENOUS | Status: DC
  Administered 2019-08-19 – 2019-08-21 (×3): 40 mg via INTRAVENOUS
  Filled 2019-08-18 (×4): qty 40

## 2019-08-18 MED ORDER — ONDANSETRON HCL 4 MG/2ML IJ SOLN: 4.0000 mg | Freq: Four times a day (QID) | INTRAMUSCULAR | Status: DC | PRN

## 2019-08-18 MED ORDER — ORAL CARE MOUTH RINSE
15.0000 mL | Freq: Two times a day (BID) | OROMUCOSAL | Status: DC
  Administered 2019-08-18 – 2019-08-25 (×12): 15 mL via OROMUCOSAL

## 2019-08-18 MED ORDER — HEPARIN SODIUM (PORCINE) 5000 UNIT/ML IJ SOLN
5000.0000 [IU] | Freq: Three times a day (TID) | INTRAMUSCULAR | Status: DC
  Administered 2019-08-18 – 2019-08-25 (×20): 5000 [IU] via SUBCUTANEOUS
  Filled 2019-08-18 (×20): qty 1

## 2019-08-18 MED ORDER — SODIUM CHLORIDE 0.9 % IV SOLN: 2.0000 g | INTRAVENOUS | Status: DC

## 2019-08-18 MED ORDER — METRONIDAZOLE IN NACL 5-0.79 MG/ML-% IV SOLN
500.0000 mg | Freq: Once | INTRAVENOUS | Status: AC
  Administered 2019-08-18: 500 mg via INTRAVENOUS
  Filled 2019-08-18: qty 100

## 2019-08-18 MED ORDER — SODIUM CHLORIDE 0.9 % IV SOLN
250.0000 mL | INTRAVENOUS | Status: DC
  Administered 2019-08-18 – 2019-08-21 (×2): 250 mL via INTRAVENOUS

## 2019-08-18 MED ORDER — SODIUM CHLORIDE 0.9 % IV SOLN
2.0000 g | Freq: Once | INTRAVENOUS | Status: AC
  Administered 2019-08-18: 2 g via INTRAVENOUS
  Filled 2019-08-18: qty 2

## 2019-08-18 MED ORDER — METRONIDAZOLE IN NACL 5-0.79 MG/ML-% IV SOLN
500.0000 mg | Freq: Three times a day (TID) | INTRAVENOUS | Status: DC
  Administered 2019-08-18 – 2019-08-20 (×5): 500 mg via INTRAVENOUS
  Filled 2019-08-18 (×5): qty 100

## 2019-08-18 MED ORDER — ACETAMINOPHEN 325 MG PO TABS
650.0000 mg | ORAL_TABLET | ORAL | Status: DC | PRN
  Administered 2019-08-19 – 2019-08-22 (×4): 650 mg via ORAL
  Filled 2019-08-18 (×4): qty 2

## 2019-08-18 MED ORDER — NOREPINEPHRINE 4 MG/250ML-% IV SOLN
2.0000 ug/min | INTRAVENOUS | Status: DC
  Administered 2019-08-18: 2 ug/min via INTRAVENOUS
  Administered 2019-08-19: 8 ug/min via INTRAVENOUS
  Administered 2019-08-19: 6 ug/min via INTRAVENOUS
  Administered 2019-08-20: 12 ug/min via INTRAVENOUS
  Administered 2019-08-21: 9 ug/min via INTRAVENOUS
  Administered 2019-08-21: 10 ug/min via INTRAVENOUS
  Filled 2019-08-18 (×8): qty 250

## 2019-08-18 MED ORDER — SODIUM CHLORIDE 0.9 % IV BOLUS (SEPSIS)
250.0000 mL | Freq: Once | INTRAVENOUS | Status: AC
  Administered 2019-08-18: 250 mL via INTRAVENOUS

## 2019-08-18 NOTE — Consult Note (Signed)
Referring Provider: No ref. provider found Primary Care Physician:  [name], PA-C (Inactive) Primary Nephrologist:    Reason for Consultation: Medical management end-stage renal disease, assessment treatment of anemia, treatment and secondary hyperparathyroidism  HPI: This is a 74 year old gentleman who has a history of diabetes coronary disease history of congestive heart failure bilateral below-knee amputations end-stage renal disease on dialysis 12 years [place].  It appears that in May 2020 he was admitted for small bowel obstruction with an incarcerated hernia.  He has chronic hypotension uses midodrine 3 times daily.  His usual days are Monday Wednesday Friday.  He presents to the emergency room with possible sepsis.  And received antibiotics vancomycin, Flagyl and cefepime.  He lives alone and has been feeling unwell for about a week his last dialysis treatment was Wednesday, 08/13/2019.  Blood pressure 70/45 pulse 71 temperature 98.5 O2 sats 94% 2 L nasal cannula  Has received 2 L IV fluids in the emergency room  Sodium 138 potassium 4.2 chloride 98 CO2 17 creatinine 8.52 BUN 6 6 glucose 88 calcium 8.3 albumin 2.9 liver enzymes were normal range WBC 14.1 hemoglobin 11.1 platelets 109  Medications cinacalcet 60 mg daily, Neurontin 100 mg daily, midodrine 50 mg 3 times a day multivitamins 1 daily Protonix 40 mg daily  Chest x-ray showed low lung volumes bilateral infiltrates.  Possible atelectasis with background of pulmonary edema.   Past Medical History:  Diagnosis Date  . CHF (congestive heart failure) ([place])   . Coronary artery disease   . Diabetes mellitus without complication ([place])   . Renal disorder   . S/P bilateral BKA (below knee amputation) [facility])     Past Surgical History:  Procedure Laterality Date  . BELOW KNEE LEG AMPUTATION Bilateral   . CARDIAC CATHETERIZATION  1998/1999   stents both times, no other details available  . CARDIAC  DEFIBRILLATOR PLACEMENT  2010   [employer]  . INCISIONAL HERNIA REPAIR  10/06/2018  . INCISIONAL HERNIA REPAIR N/A 10/06/2018   Procedure: HERNIA REPAIR INCISIONAL;  Surgeon: Donnie Mesa, MD;  Location: [place];  Service: General;  Laterality: N/A;    Prior to Admission medications   Medication Sig Start Date End Date Taking? Authorizing Provider  acetaminophen (TYLENOL) 325 MG tablet Take 650 mg by mouth every 4 (four) hours as needed for mild pain or fever.   Yes [provider]  b complex-vitamin c-folic acid (NEPHRO-VITE) 0.8 MG TABS tablet Take 1 tablet by mouth daily. 07/09/19  Yes [provider]  cinacalcet (SENSIPAR) 60 MG tablet Take 60 mg by mouth daily.  09/12/18  Yes [provider]  feeding supplement, RESOURCE BREEZE, (RESOURCE BREEZE) LIQD Take 1 Container by mouth 3 (three) times daily between meals. 07/10/14  Yes Ghimire, Henreitta Leber, MD  gabapentin (NEURONTIN) 100 MG capsule Take 100 mg by mouth at bedtime.   Yes [provider]  latanoprost (XALATAN) 0.005 % ophthalmic solution Apply 1 drop to eye at bedtime. 10/23/14  Yes [provider]  Melatonin 3 MG TABS Take 3 mg by mouth at bedtime as needed (sleep).   Yes [provider]  midodrine (PROAMATINE) 5 MG tablet Take 15 mg by mouth 3 (three) times daily with meals.   Yes [provider]  Multiple Vitamins tablet Take 1 tablet by mouth daily. 01/23/18  Yes [provider]  pravastatin (PRAVACHOL) 10 MG tablet Take 5 mg by mouth at bedtime.   Yes [provider]    Current  Facility-Administered Medications  Medication Dose Route Frequency Provider Last Rate Last Admin  . 0.9 %  sodium chloride infusion   Intravenous Continuous Chesley Mires, MD      . acetaminophen (TYLENOL) tablet 650 mg  650 mg Oral Q4H PRN Chesley Mires, MD      . Derrill Memo ON 08/19/2019] cinacalcet (SENSIPAR) tablet 60 mg  60 mg Oral Daily Sood, Vineet, MD      . gabapentin (NEURONTIN)  capsule 100 mg  100 mg Oral QHS Sood, Vineet, MD      . latanoprost (XALATAN) 0.005 % ophthalmic solution 1 drop  1 drop Both Eyes QHS Sood, Vineet, MD      . MEDLINE mouth rinse  15 mL Mouth Rinse BID Halford Chessman, Vineet, MD      . midodrine (PROAMATINE) tablet 15 mg  15 mg Oral TID WC Sood, Vineet, MD      . multivitamin (RENA-VIT) tablet 1 tablet  1 tablet Oral QHS Sood, Vineet, MD      . ondansetron (ZOFRAN) injection 4 mg  4 mg Intravenous Q6H PRN Chesley Mires, MD      . pantoprazole (PROTONIX) injection 40 mg  40 mg Intravenous Q24H Chesley Mires, MD       Current Outpatient Medications  Medication Sig Dispense Refill  . acetaminophen (TYLENOL) 325 MG tablet Take 650 mg by mouth every 4 (four) hours as needed for mild pain or fever.    Marland Kitchen b complex-vitamin c-folic acid (NEPHRO-VITE) 0.8 MG TABS tablet Take 1 tablet by mouth daily.    . cinacalcet (SENSIPAR) 60 MG tablet Take 60 mg by mouth daily.     . feeding supplement, RESOURCE BREEZE, (RESOURCE BREEZE) LIQD Take 1 Container by mouth 3 (three) times daily between meals.  0  . gabapentin (NEURONTIN) 100 MG capsule Take 100 mg by mouth at bedtime.    Marland Kitchen latanoprost (XALATAN) 0.005 % ophthalmic solution Apply 1 drop to eye at bedtime.    . Melatonin 3 MG TABS Take 3 mg by mouth at bedtime as needed (sleep).    . midodrine (PROAMATINE) 5 MG tablet Take 15 mg by mouth 3 (three) times daily with meals.    . Multiple Vitamins tablet Take 1 tablet by mouth daily.    . pravastatin (PRAVACHOL) 10 MG tablet Take 5 mg by mouth at bedtime.      Allergies as of 08/18/2019 - Review Complete 08/18/2019  Allergen Reaction Noted  . Simvastatin Other (See Comments) 06/28/2014    No family history on file.  Social History   Socioeconomic History  . Marital status: Single    Spouse name: Not on file  . Number of children: Not on file  . Years of education: Not on file  . Highest education level: Not on file  Occupational History  . Not on file   Tobacco Use  . Smoking status: Never Smoker  . Smokeless tobacco: Never Used  Substance and Sexual Activity  . Alcohol use: No  . Drug use: No  . Sexual activity: Not on file  Other Topics Concern  . Not on file  Social History Narrative  . Not on file   Social Determinants of Health   Financial Resource Strain:   . Difficulty of Paying Living Expenses:   Food Insecurity:   . Worried About Charity fundraiser in the Last Year:   . Arboriculturist in the Last Year:   Transportation Needs:   . Film/video editor (Medical):   Marland Kitchen  Lack of Transportation (Non-Medical):   Physical Activity:   . Days of Exercise per Week:   . Minutes of Exercise per Session:   Stress:   . Feeling of Stress :   Social Connections:   . Frequency of Communication with Friends and Family:   . Frequency of Social Gatherings with Friends and Family:   . Attends Religious Services:   . Active Member of Clubs or Organizations:   . Attends Archivist Meetings:   Marland Kitchen Marital Status:   Intimate Partner Violence:   . Fear of Current or Ex-Partner:   . Emotionally Abused:   Marland Kitchen Physically Abused:   . Sexually Abused:     Review of Systems:   Physical Exam: Vital signs in last 24 hours: Temp:  [98.5 F (36.9 C)] 98.5 F (36.9 C) (04/05 1057) Pulse Rate:  [72-79] 77 (04/05 1245) Resp:  [18-34] 18 (04/05 1245) BP: (53-83)/(38-57) 77/53 (04/05 1245) SpO2:  [91 %-98 %] 93 % (04/05 1245)   General:   Chronically ill-appearing gentleman not in any respiratory distress Head:  Normocephalic and atraumatic. Eyes:  Sclera clear, no icterus.   Conjunctiva pink. Ears:  Normal auditory acuity. Nose:  No deformity, discharge,  or lesions. Mouth:  No deformity or lesions, dentition normal. Neck:  Supple; no masses or thyromegaly. JVP mildly elevated Lungs:  Clear throughout to auscultation.  Slightly diminished at bases no crackles no wheezes Heart:  Regular rate and rhythm; no murmurs, clicks,  rubs,  or gallops. Abdomen:  Soft, nontender and nondistended. No masses, hepatosplenomegaly or hernias noted. Normal bowel sounds, without guarding, and without rebound.   Msk: Bilateral amputations lower extremities Pulses:  No carotid, renal, femoral bruits.  Extremities:  Without clubbing or edema.  AV fistula left thrill and bruit Neurologic:  Alert and  oriented x4;  grossly normal neurologically. Skin:  Intact without significant lesions or rashes.   Intake/Output from previous day: No intake/output data recorded. Intake/Output this shift: Total I/O In: 2300 [IV Piggyback:2300] Out: -   Lab Results: Recent Labs    08/18/19 1120  WBC 14.1*  HGB 11.1*  HCT 35.1*  PLT 109*   BMET Recent Labs    08/18/19 1120  NA 138  K 4.2  CL 98  CO2 17*  GLUCOSE 82  BUN 66*  CREATININE 8.52*  CALCIUM 8.3*   LFT Recent Labs    08/18/19 1120  PROT 7.8  ALBUMIN 2.9*  AST 29  ALT 21  ALKPHOS 135*  BILITOT 1.8*   PT/INR Recent Labs    08/18/19 1120  LABPROT 20.5*  INR 1.8*   Hepatitis Panel No results for input(s): HEPBSAG, HCVAB, HEPAIGM, HEPBIGM in the last 72 hours.  Studies/Results: DG Chest Port 1 View  Result Date: 08/18/2019 CLINICAL DATA:  Sepsis, weakness and diarrhea EXAM: PORTABLE CHEST 1 VIEW COMPARISON:  10/06/2018 FINDINGS: Four lead pacer defibrillator power pack over left chest. Leads unchanged relative to previous exam. Lung volumes are low with buckling of the trachea. Accentuation of cardiac silhouette due to low lung volumes and portable technique with similar appearance heart size is enlarged likely unchanged. Increased interstitial markings bilaterally with areas of juxta diaphragmatic airspace disease at the lung bases. Visualized skeletal structures are unremarkable. IMPRESSION: Low lung volumes with increased interstitial markings bilaterally with areas of juxta diaphragmatic airspace disease at the lung bases. Probable background pulmonary edema  with small fissural fluid along the minor fissure in the right chest. Airspace disease at the lung bases  likely atelectasis. Electronically Signed   By: Zetta Bills M.D.   On: 08/18/2019 11:32    Assessment/Plan:  End-stage renal disease last dialysis treatment 08/13/2019 [place].  Patient has been feeling unwell for about a week.  Has not received dialysis treatment on Friday, 08/15/2019.  He presents to the most current emergency room 08/18/2019.  He has a left forearm AV fistula.  There appears to be no urgent indications for dialysis at this present time.  If patient continues to maintain hypotension.  It may be necessary to initiate CRRT.  Hypertension/volume patient does not appear to particular volume overloaded although infiltrates on chest x-ray with background pulmonary edema could be concerning.  Chronic low blood pressures in the 70s at baseline.  Requiring midodrine.  We should see how he does with some midodrine support.  Would DC IV fluids at this point.  Anemia stable does not appear to be an issue at this time  Bones currently using cinacalcet.  Will need to follow calcium closely  Diabetes mellitus sliding scale insulin  Discussed with Dr. Halford Chessman and feel that we should wait on performing dialysis.  Continue antibiotics and supportive care at this point.  There are no emergent indications for hemodialysis and I am concerned about systolic hypotension.  Daughter is at bedside and discussed plan with daughter   LOS: [place] @TODAY @2 :02 PM

## 2019-08-18 NOTE — Progress Notes (Signed)
Pharmacy Antibiotic Note  Clayton [name] is a 74 y.o. male admitted on 08/18/2019 with sepsis.  Pharmacy has been consulted for vancomycin and cefepime dosing. Pt is afebrile but WBC is elevated at 14.1. Lactic acid is also elevated at 5.1. Pt with history of ESRD on HD and he missed he last HD session.   Plan: Vancomycin 1gm IV x 1 then 750mg  post-HD Cefepime 2gm IV x 1 then QHD F/u renal plans, C&S, clinical status and pre-HD vanc level PRN    Temp (24hrs), Avg:98.5 F (36.9 C), Min:98.5 F (36.9 C), Max:98.5 F (36.9 C)  Recent Labs  Lab 08/18/19 1120  WBC 14.1*  LATICACIDVEN 5.1*    CrCl cannot be calculated (Patient's most recent lab result is older than the maximum 21 days allowed.).    Allergies  Allergen Reactions  . Simvastatin Other (See Comments)    unknown    Antimicrobials this admission: Vanc 4/5>> Cefepime 4/5>> Flagyl x 1 4/5  Dose adjustments this admission: N/A  Microbiology results: Pending  Thank you for allowing pharmacy to be a part of this patient's care.  Kaya Klausing, Rande Lawman 08/18/2019 11:22 AM

## 2019-08-18 NOTE — ED Notes (Signed)
Notified ccm of pt's continued hypotension after midodrine.

## 2019-08-18 NOTE — ED Provider Notes (Signed)
[facility] EMERGENCY DEPARTMENT Provider Note   CSN: [id number] Arrival date & time: 08/18/19  1047     History Chief Complaint  Patient presents with  . Weakness  . Diarrhea    Clayton [name] is a 74 y.o. male.  The history is provided by the patient, medical records and a relative. No language interpreter was used.  Illness Location:  Diarrhea and fatigue Severity:  Severe Onset quality:  Gradual Duration:  1 week Timing:  Constant Progression:  Worsening Chronicity:  Recurrent Associated symptoms: diarrhea, fatigue and fever   Associated symptoms: no abdominal pain, no chest pain, no congestion, no cough, no headaches, no loss of consciousness, no myalgias, no nausea, no shortness of breath, no vomiting and no wheezing        Past Medical History:  Diagnosis Date  . CHF (congestive heart failure) ([place])   . Coronary artery disease   . Diabetes mellitus without complication ([place])   . Renal disorder   . S/P bilateral BKA (below knee amputation) [facility])     Patient Active Problem List   Diagnosis Date Noted  . Pressure injury of skin 10/08/2018  . Syncope and collapse   . Acute on chronic diastolic CHF (congestive heart failure) ([place])   . End stage renal disease on dialysis ([place])   . Nonsustained ventricular tachycardia ([place])   . Diabetes type 2, controlled ([place])   . Abdominal distension   . Malnutrition of moderate degree ([place]) 07/02/2014  . C. difficile colitis   . Palliative care encounter   . Acute encephalopathy   . Cardiogenic shock ([place])   . Severe sepsis with septic shock ([place]) 06/29/2014  . ESRD (end stage renal disease) on dialysis ([place]) 06/29/2014  . DM type 2, uncontrolled, with renal complications ([place]) 21/19/4174  . DM (diabetes mellitus), type 2 with peripheral vascular complications ([place]) 12/26/4816  . Peripheral vascular disease due to secondary diabetes mellitus ([place]) 06/29/2014  . Chronic systolic CHF (congestive heart  failure), NYHA class 3 ([place])   . Coronary artery disease   . S/P bilateral BKA (below knee amputation) [facility])     Past Surgical History:  Procedure Laterality Date  . BELOW KNEE LEG AMPUTATION Bilateral   . CARDIAC CATHETERIZATION  1998/1999   stents both times, no other details available  . CARDIAC DEFIBRILLATOR PLACEMENT  2010   [employer]  . INCISIONAL HERNIA REPAIR  10/06/2018  . INCISIONAL HERNIA REPAIR N/A 10/06/2018   Procedure: HERNIA REPAIR INCISIONAL;  Surgeon: Donnie Mesa, MD;  Location: [facility];  Service: General;  Laterality: N/A;       No family history on file.  Social History   Tobacco Use  . Smoking status: Never Smoker  . Smokeless tobacco: Never Used  Substance Use Topics  . Alcohol use: No  . Drug use: No    Home Medications Prior to Admission medications   Medication Sig Start Date End Date Taking? Authorizing Provider  cinacalcet (SENSIPAR) 60 MG tablet Take 60 mg by mouth daily.  09/12/18   [provider]  feeding supplement, RESOURCE BREEZE, (RESOURCE BREEZE) LIQD Take 1 Container by mouth 3 (three) times daily between meals. 07/10/14   Ghimire, Henreitta Leber, MD  gabapentin (NEURONTIN) 100 MG capsule Take 100 mg by mouth at bedtime.    [provider]  Melatonin 3 MG TABS Take 3 mg by mouth at bedtime as needed (sleep).    [provider]  midodrine (PROAMATINE) 5 MG tablet Take 15  mg by mouth 3 (three) times daily with meals.    [provider]  pravastatin (PRAVACHOL) 10 MG tablet Take 5 mg by mouth at bedtime.    [provider]  REFRESH TEARS 0.5 % SOLN Place 1 drop into both eyes 2 (two) times a day. 09/02/18   [provider]  sevelamer carbonate (RENVELA) 800 MG tablet Take 1,600 mg by mouth 3 (three) times daily with meals. 05/31/18   [provider]    Allergies    Simvastatin  Review of Systems   Review of Systems  Constitutional: Positive for chills, fatigue and fever. Negative  for diaphoresis.  HENT: Negative for congestion.   Respiratory: Negative for cough, chest tightness, shortness of breath, wheezing and stridor.   Cardiovascular: Negative for chest pain, palpitations and leg swelling.  Gastrointestinal: Positive for diarrhea. Negative for abdominal pain, constipation, nausea and vomiting.  Genitourinary:       No urine  Musculoskeletal: Negative for back pain, myalgias, neck pain and neck stiffness.  Neurological: Positive for light-headedness. Negative for dizziness, loss of consciousness, weakness, numbness and headaches.  Psychiatric/Behavioral: Negative for agitation and confusion.  All other systems reviewed and are negative.   Physical Exam Updated Vital Signs BP (!) 71/54 (BP Location: Right Arm)   Pulse 75   Temp 98.5 F (36.9 C) (Temporal)   Resp (!) 31   SpO2 91%   Physical Exam Vitals and nursing note reviewed.  Constitutional:      General: He is not in acute distress.    Appearance: He is well-developed. He is ill-appearing. He is not toxic-appearing or diaphoretic.  HENT:     Head: Normocephalic and atraumatic.     Nose: Nose normal. No congestion or rhinorrhea.     Mouth/Throat:     Mouth: Mucous membranes are dry.     Pharynx: No oropharyngeal exudate or posterior oropharyngeal erythema.  Eyes:     Extraocular Movements: Extraocular movements intact.     Conjunctiva/sclera: Conjunctivae normal.     Pupils: Pupils are equal, round, and reactive to light.  Cardiovascular:     Rate and Rhythm: Normal rate and regular rhythm.     Pulses: Normal pulses.     Heart sounds: No murmur.  Pulmonary:     Effort: Pulmonary effort is normal. No respiratory distress.     Breath sounds: Rales present. No wheezing or rhonchi.  Chest:     Chest wall: No tenderness.  Abdominal:     General: Abdomen is flat.     Palpations: Abdomen is soft.     Tenderness: There is no abdominal tenderness. There is no right CVA tenderness, left CVA  tenderness, guarding or rebound.  Musculoskeletal:        General: No tenderness.     Cervical back: Neck supple. No tenderness.  Skin:    General: Skin is warm and dry.     Findings: No erythema.  Neurological:     General: No focal deficit present.     Mental Status: He is alert.     Sensory: No sensory deficit.  Psychiatric:        Mood and Affect: Mood normal.     ED Results / Procedures / Treatments   Labs (all labs ordered are listed, but only abnormal results are displayed) Labs Reviewed  LACTIC ACID, PLASMA - Abnormal; Notable for the following components:      Result Value   Lactic Acid, Venous 5.1 (*)    All  other components within normal limits  LACTIC ACID, PLASMA - Abnormal; Notable for the following components:   Lactic Acid, Venous 3.0 (*)    All other components within normal limits  COMPREHENSIVE METABOLIC PANEL - Abnormal; Notable for the following components:   CO2 17 (*)    BUN 66 (*)    Creatinine, Ser 8.52 (*)    Calcium 8.3 (*)    Albumin 2.9 (*)    Alkaline Phosphatase 135 (*)    Total Bilirubin 1.8 (*)    GFR calc non Af Amer 6 (*)    GFR calc Af Amer 7 (*)    Anion gap 23 (*)    All other components within normal limits  CBC WITH DIFFERENTIAL/PLATELET - Abnormal; Notable for the following components:   WBC 14.1 (*)    RBC 3.43 (*)    Hemoglobin 11.1 (*)    HCT 35.1 (*)    MCV 102.3 (*)    Platelets 109 (*)    Neutro Abs 13.1 (*)    Lymphs Abs 0.4 (*)    Abs Immature Granulocytes 0.14 (*)    All other components within normal limits  APTT - Abnormal; Notable for the following components:   aPTT 40 (*)    All other components within normal limits  PROTIME-INR - Abnormal; Notable for the following components:   Prothrombin Time 20.5 (*)    INR 1.8 (*)    All other components within normal limits  RESPIRATORY PANEL BY RT PCR (FLU A&B, COVID)  CULTURE, BLOOD (ROUTINE X 2)  CULTURE, BLOOD (ROUTINE X 2)  GI PATHOGEN PANEL BY PCR, STOOL    C DIFFICILE QUICK SCREEN W PCR REFLEX  MRSA PCR SCREENING  LIPASE, BLOOD  CORTISOL    EKG EKG Interpretation  Date/Time:  Monday August 18 2019 10:53:25 EDT Ventricular Rate:  71 PR Interval:    QRS Duration: 212 QT Interval:  469 QTC Calculation: 510 R Axis:   142 Text Interpretation: Borderline ST depression, inferior leads When comapred to prior, similar paced rhythm No STEMI Confirmed by Antony Blackbird [phone]) on 08/18/2019 11:18:03 AM   Radiology DG Chest Port 1 View  Result Date: 08/18/2019 CLINICAL DATA:  Sepsis, weakness and diarrhea EXAM: PORTABLE CHEST 1 VIEW COMPARISON:  10/06/2018 FINDINGS: Four lead pacer defibrillator power pack over left chest. Leads unchanged relative to previous exam. Lung volumes are low with buckling of the trachea. Accentuation of cardiac silhouette due to low lung volumes and portable technique with similar appearance heart size is enlarged likely unchanged. Increased interstitial markings bilaterally with areas of juxta diaphragmatic airspace disease at the lung bases. Visualized skeletal structures are unremarkable. IMPRESSION: Low lung volumes with increased interstitial markings bilaterally with areas of juxta diaphragmatic airspace disease at the lung bases. Probable background pulmonary edema with small fissural fluid along the minor fissure in the right chest. Airspace disease at the lung bases likely atelectasis. Electronically Signed   By: Zetta Bills M.D.   On: 08/18/2019 11:32    Procedures Procedures (including critical care time)  CRITICAL CARE Performed by: Gwenyth Allegra Shaun Runyon Total critical care time: 45 minutes Critical care time was exclusive of separately billable procedures and treating other patients. Critical care was necessary to treat or prevent imminent or life-threatening deterioration. Critical care was time spent personally by me on the following activities: development of treatment plan with patient and/or  surrogate as well as nursing, discussions with consultants, evaluation of patient's response to treatment, examination of patient, obtaining  history from patient or surrogate, ordering and performing treatments and interventions, ordering and review of laboratory studies, ordering and review of radiographic studies, pulse oximetry and re-evaluation of patient's condition.   Medications Ordered in ED Medications  ondansetron (ZOFRAN) injection 4 mg (has no administration in time range)  pantoprazole (PROTONIX) injection 40 mg (has no administration in time range)  acetaminophen (TYLENOL) tablet 650 mg (has no administration in time range)  cinacalcet (SENSIPAR) tablet 60 mg (has no administration in time range)  gabapentin (NEURONTIN) capsule 100 mg (has no administration in time range)  latanoprost (XALATAN) 0.005 % ophthalmic solution 1 drop (has no administration in time range)  midodrine (PROAMATINE) tablet 15 mg (has no administration in time range)  multivitamin (RENA-VIT) tablet 1 tablet (has no administration in time range)  MEDLINE mouth rinse (has no administration in time range)  0.9 %  sodium chloride infusion (has no administration in time range)  ceFEPIme (MAXIPIME) 2 g in sodium chloride 0.9 % 100 mL IVPB (has no administration in time range)  heparin injection 5,000 Units (has no administration in time range)  metroNIDAZOLE (FLAGYL) IVPB 500 mg (has no administration in time range)  sodium chloride 0.9 % bolus 1,000 mL (0 mLs Intravenous Stopped 08/18/19 1145)    And  sodium chloride 0.9 % bolus 1,000 mL (0 mLs Intravenous Stopped 08/18/19 1202)    And  sodium chloride 0.9 % bolus 250 mL (0 mLs Intravenous Stopped 08/18/19 1429)  ceFEPIme (MAXIPIME) 2 g in sodium chloride 0.9 % 100 mL IVPB (0 g Intravenous Stopped 08/18/19 1216)  metroNIDAZOLE (FLAGYL) IVPB 500 mg (0 mg Intravenous Stopped 08/18/19 1429)  vancomycin (VANCOCIN) IVPB 1000 mg/200 mL premix (0 mg Intravenous Stopped 08/18/19  1258)  midodrine (PROAMATINE) tablet 15 mg (15 mg Oral Given 08/18/19 1437)    ED Course  I have reviewed the triage vital signs and the nursing notes.  Pertinent labs & imaging results that were available during my care of the patient were reviewed by me and considered in my medical decision making (see chart for details).    MDM Rules/Calculators/A&P                      Clayton [name] is a 74 y.o. male with a past medical history significant for ESRD on dialysis Monday Wednesday Friday, CHF with pacemaker, CAD, bilateral BKA, diabetes, prior C. difficile colitis, and prior septic shock who presents with fevers, chills, diarrhea, decreased appetite, malaise, fatigue, and missing dialysis.  According to patient and family, patient has been feeling bad for the last week.  He has been having decreased appetite and having diarrhea.  He is feeling generalized weakness.  No focal neurologic weakness or complaints but just generalized fatigue.  They report that he did not go to dialysis on Friday due to not feeling well and then felt worse today.  Patient was found to be hypotensive with blood pressures in the 15Q and 00Q systolic.  Patient has been having nonbloody diarrhea.  He denies any nausea, vomiting, chest pain, shortness of breath, palpitations.  He reports that he got his Covid shot on Saturday.  He denies new cough and denies any other Covid exposures.  Family accompanies patient and reports that he generally has low blood pressures and has had pressures in the 70s 80s and 90s in the past normally.  On exam, patient has some crackles and fluid in the bases but no specific rhonchi.  Chest and abdomen nontender.  Small umbilical hernia was palpated but was reducible and nontender.  Patient has bilateral BKA's without significant tenderness.  Patient is somnolent but arousable to voice.  Clinically I am concerned about the patient being intravascularly dehydrated due to the diarrhea and  decreased oral intake over the last week accompanied by worsened diarrhea.  I am also concerned that the patient has missed dialysis both Friday and today and may have some fluid overload in other parts of his body.  Patient was found to have a rectal temperature of 103.  With this, the hypotension in the 60s, the tachypnea, the borderline hypoxia with oxygen saturation between 89 and 91 on room air, decision made to make patient a code sepsis.  The fever may indeed be from his coronavirus shot 2 days ago however given the vital signs, do not feel comfortable not treating him for possible bacterial infection.  We will start to give some fluids and monitor patient's respiratory status.  Will get other labs.  Will get blood cultures.  Anticipate admission for the sepsis.  We will get a coronavirus test as well.  If pressures drop or do not improve, patient may require pressors.  Family feels that blood pressure in the 70s and 80s is normal for him so we will not start with the pressors initially.  Map remains over 65 at this time.  Due to the hypotension, critical care was called who will admit patient.  I spoke with nephrology who will also see patient for likely dialysis or CRRT as he has now missed 2 sessions.  Patient will be admitted for code sepsis with unknown source, fever, and hypotension.   Final Clinical Impression(s) / ED Diagnoses Final diagnoses:  Sepsis, due to unspecified organism, unspecified whether acute organ dysfunction present [facility])    Clinical Impression: 1. Sepsis, due to unspecified organism, unspecified whether acute organ dysfunction present [facility])     Disposition: Admit  This note was prepared with assistance of Dragon voice recognition software. Occasional wrong-word or sound-a-like substitutions may have occurred due to the inherent limitations of voice recognition software.      Helga Asbury, Gwenyth Allegra, MD 08/18/19 [phone]

## 2019-08-18 NOTE — ED Notes (Signed)
Nephrologist at bedside. States to stop 125 ml ns.  He is aware of pt's blood pressure and states to give midodrine now that was ordered for this evening.  Called pharmacy to request that they change time and send midodrine.

## 2019-08-18 NOTE — ED Notes (Signed)
CCM paged RE continued htn.

## 2019-08-18 NOTE — ED Notes (Signed)
ICU Dinner ordered

## 2019-08-18 NOTE — H&P (Signed)
PULMONARY / CRITICAL CARE MEDICINE   NAME:  Clayton [name], MRN:  [id number], DOB:  04/27/1946, LOS: 0 ADMISSION DATE:  08/18/2019, CONSULTATION DATE:  08/18/2019 REFERRING MD:  Dr. Sherry Ruffing, ER, CHIEF COMPLAINT:  Diarrhea  BRIEF HISTORY:    74 yo male [place] (followed at [place] and [place]) brought to ER feeling weak.  Reports have nausea/vomiting/diarrhea for two weeks prior to admission.  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  Hx of chronic hypotension on midodrine (baseline SBP in 80's), and ESRD.  Missed his dialysis regimen on 08/15/19 due to feeling weak.  HISTORY OF PRESENT ILLNESS   He is seen in ER with his daughter.  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  Hx of ESRD on HD M/W/F.  He wasn't able to get HD on 08/15/19 due to feeling weak.  Started having nausea, vomiting, and diarrhea about 2 weeks ago.  He reports bilious emesis material.  No hematemesis, melena, or hematochezia.  No recent sick exposures.  Denies fever.  Has mild abdominal discomfort.  Was able to eat food in PM on 4/04.  Denies feeling short of breath or having chest pain.  No recent change in medications.  He received 2nd dose of [place] vaccine on 08/16/19.  He reports hx of chronic hypotension with baseline SBP in 80's.  His BP initially in ER was 53/38 and improved after getting 2.5 liters normal saline in ER.  Lipase was normal, initial lactic acid was 5.1.  SIGNIFICANT PAST MEDICAL HISTORY   CHF, ESRD, DM, CAD, s/p AICD, PAD s/p b/l BKA  SIGNIFICANT EVENTS:  4/05 Admit  STUDIES:     CULTURES:  SARS CoV2 4/05 >> C diff PCR 4/05 >> GI pathogen 4/05 >> Blood 4/05 >>   ANTIBIOTICS:  Vancomycin 4/05  Cefepime 4/05 >> Flagyl 4/05 >>  LINES/TUBES:    CONSULTANTS:  Nephrology  SUBJECTIVE:    CONSTITUTIONAL: BP (!) 77/53   Pulse 77   Temp 98.5 F (36.9 C) (Temporal)   Resp 18   SpO2 93%   No intake/output data recorded.        PHYSICAL  EXAM:  General - alert Eyes - pupils reactive ENT - no sinus tenderness, no stridor Cardiac - irregular, AICD in Lt upper chest Chest - some accessory muscle use, no wheeze, faint b/l rales, speaking in full sentences without stopping to catch his breath Abdomen - soft, mild tenderness in epigastric area, reducible umbilical hernia, no rebound or guarding Extremities - b/l BKA, AV fistula Lt arm Skin - no rashes Neuro - normal strength, moves extremities, follows commands Lymphatics - no lymphadenopathy Psych - normal mood and behavior    RESOLVED PROBLEM LIST    ASSESSMENT AND PLAN    Shock. - likely hypovolemic in setting of nausea/vomiting/diarrhea - could also have component of sepsis - continue ABx - f/u blood culture, GI pathogen panel, C diff PCR - continue midodrine - check cortisol level  ESRD. - nephrology consulted  Neuropathy. - continue neurontin  Best Practice / Goals of Care / Disposition.   DVT PROPHYLAXIS: SQ heparin SUP: Protonix NUTRITION:Clear liquids MOBILITY: Bed rest GOALS OF CARE: full code FAMILY DISCUSSIONS: updated pt's daughter at bedside DISPOSITION ICU  D/w Dr. Justin Mend  LABS  Glucose No results for input(s): GLUCAP in the last 168 hours.  BMET Recent Labs  Lab 08/18/19 1120  NA 138  K 4.2  CL 98  CO2 17*  BUN  66*  CREATININE 8.52*  GLUCOSE 82    Liver Enzymes Recent Labs  Lab 08/18/19 1120  AST 29  ALT 21  ALKPHOS 135*  BILITOT 1.8*  ALBUMIN 2.9*    Electrolytes Recent Labs  Lab 08/18/19 1120  CALCIUM 8.3*    CBC Recent Labs  Lab 08/18/19 1120  WBC 14.1*  HGB 11.1*  HCT 35.1*  PLT 109*    ABG No results for input(s): PHART, PCO2ART, PO2ART in the last 168 hours.  Coag's Recent Labs  Lab 08/18/19 1120  APTT 40*  INR 1.8*    Sepsis Markers Recent Labs  Lab 08/18/19 1120  LATICACIDVEN 5.1*    Cardiac Enzymes No results for input(s): TROPONINI, PROBNP in the last 168 hours.  PAST  MEDICAL HISTORY :   He  has a past medical history of CHF (congestive heart failure) ([place]), Coronary artery disease, Diabetes mellitus without complication ([place]), Renal disorder, and S/P bilateral BKA (below knee amputation) ([place]).  PAST SURGICAL HISTORY:  He  has a past surgical history that includes Below knee leg amputation (Bilateral); Cardiac catheterization (1998/1999); Cardiac defibrillator placement (2010); Incisional hernia repair (10/06/2018); and Incisional hernia repair (N/A, 10/06/2018).  Allergies  Allergen Reactions  . Simvastatin Other (See Comments)    unknown    No current facility-administered medications on file prior to encounter.   Current Outpatient Medications on File Prior to Encounter  Medication Sig  . acetaminophen (TYLENOL) 325 MG tablet Take 650 mg by mouth every 4 (four) hours as needed for mild pain or fever.  Marland Kitchen b complex-vitamin c-folic acid (NEPHRO-VITE) 0.8 MG TABS tablet Take 1 tablet by mouth daily.  . cinacalcet (SENSIPAR) 60 MG tablet Take 60 mg by mouth daily.   . feeding supplement, RESOURCE BREEZE, (RESOURCE BREEZE) LIQD Take 1 Container by mouth 3 (three) times daily between meals.  . gabapentin (NEURONTIN) 100 MG capsule Take 100 mg by mouth at bedtime.  Marland Kitchen latanoprost (XALATAN) 0.005 % ophthalmic solution Apply 1 drop to eye at bedtime.  . Melatonin 3 MG TABS Take 3 mg by mouth at bedtime as needed (sleep).  . midodrine (PROAMATINE) 5 MG tablet Take 15 mg by mouth 3 (three) times daily with meals.  . Multiple Vitamins tablet Take 1 tablet by mouth daily.  . pravastatin (PRAVACHOL) 10 MG tablet Take 5 mg by mouth at bedtime.    FAMILY HISTORY:   Hypertension, diabetes  SOCIAL HISTORY:  He  reports that he has never smoked. He has never used smokeless tobacco. He reports that he does not drink alcohol or use drugs.  REVIEW OF SYSTEMS:    Reviewed and negative  CRITICAL CARE TIME: 38 minutes    SIGNATURE:  Chesley Mires, MD [place] Pager - [phone] 08/18/2019, 2:24 PM

## 2019-08-18 NOTE — ED Triage Notes (Signed)
Pt arrives from home where he lives alone- pt states he has been not feeling well for 1 week. Pt has been having diarrhea and generalized weakness. Pt last dialysis treatment was this past Wednesday he skipped Friday due to not feeling well and is due today. Pt also had second covid shot on Saturday. Home health came to see pt today and called ems due to not out of bed , warm to touch. Pt has low BP 71/54 with ems .

## 2019-08-19 ENCOUNTER — Other Ambulatory Visit: Payer: Self-pay

## 2019-08-19 DIAGNOSIS — A419 Sepsis, unspecified organism: Principal | ICD-10-CM

## 2019-08-19 DIAGNOSIS — I5082 Biventricular heart failure: Secondary | ICD-10-CM

## 2019-08-19 LAB — CBC
HCT: 34.4 % — ABNORMAL LOW (ref 39.0–52.0)
HCT: 32.9 % — ABNORMAL LOW (ref 39.0–52.0)
Hemoglobin: 11.2 g/dL — ABNORMAL LOW (ref 13.0–17.0)
Hemoglobin: 10.9 g/dL — ABNORMAL LOW (ref 13.0–17.0)
MCH: 32.7 pg (ref 26.0–34.0)
MCH: 32.8 pg (ref 26.0–34.0)
MCHC: 32.6 g/dL (ref 30.0–36.0)
MCHC: 33.1 g/dL (ref 30.0–36.0)
MCV: 100.3 fL — ABNORMAL HIGH (ref 80.0–100.0)
MCV: 99.1 fL (ref 80.0–100.0)
Platelets: 113 10*3/uL — ABNORMAL LOW (ref 150–400)
Platelets: 122 10*3/uL — ABNORMAL LOW (ref 150–400)
RBC: 3.43 MIL/uL — ABNORMAL LOW (ref 4.22–5.81)
RBC: 3.32 MIL/uL — ABNORMAL LOW (ref 4.22–5.81)
RDW: 15.7 % — ABNORMAL HIGH (ref 11.5–15.5)
RDW: 15.5 % (ref 11.5–15.5)
WBC: 20.6 10*3/uL — ABNORMAL HIGH (ref 4.0–10.5)
WBC: 16.7 10*3/uL — ABNORMAL HIGH (ref 4.0–10.5)
nRBC: 0 % (ref 0.0–0.2)
nRBC: 0 % (ref 0.0–0.2)

## 2019-08-19 LAB — COMPREHENSIVE METABOLIC PANEL
ALT: 28 U/L (ref 0–44)
AST: 51 U/L — ABNORMAL HIGH (ref 15–41)
Albumin: 2.6 g/dL — ABNORMAL LOW (ref 3.5–5.0)
Alkaline Phosphatase: 131 U/L — ABNORMAL HIGH (ref 38–126)
Anion gap: 20 — ABNORMAL HIGH (ref 5–15)
BUN: 68 mg/dL — ABNORMAL HIGH (ref 8–23)
CO2: 20 mmol/L — ABNORMAL LOW (ref 22–32)
Calcium: 8.2 mg/dL — ABNORMAL LOW (ref 8.9–10.3)
Chloride: 100 mmol/L (ref 98–111)
Creatinine, Ser: 8.59 mg/dL — ABNORMAL HIGH (ref 0.61–1.24)
GFR calc Af Amer: 6 mL/min — ABNORMAL LOW (ref >60)
GFR calc non Af Amer: 6 mL/min — ABNORMAL LOW (ref >60)
Glucose, Bld: 96 mg/dL (ref 70–99)
Potassium: 4.6 mmol/L (ref 3.5–5.1)
Sodium: 140 mmol/L (ref 135–145)
Total Bilirubin: 1.1 mg/dL (ref 0.3–1.2)
Total Protein: 7.2 g/dL (ref 6.5–8.1)

## 2019-08-19 LAB — LACTIC ACID, PLASMA: Lactic Acid, Venous: 1.8 mmol/L (ref 0.5–1.9)

## 2019-08-19 LAB — C DIFFICILE QUICK SCREEN W PCR REFLEX
C Diff antigen: NEGATIVE
C Diff interpretation: NOT DETECTED
C Diff toxin: NEGATIVE

## 2019-08-19 MED ORDER — SODIUM CHLORIDE 0.9 % IV SOLN: 100.0000 mL | INTRAVENOUS | Status: DC | PRN

## 2019-08-19 MED ORDER — MIDODRINE HCL 5 MG PO TABS: 15.0000 mg | ORAL_TABLET | Freq: Three times a day (TID) | ORAL | Status: DC

## 2019-08-19 MED ORDER — CHLORHEXIDINE GLUCONATE CLOTH 2 % EX PADS: 6.0000 | MEDICATED_PAD | Freq: Every day | CUTANEOUS | Status: DC

## 2019-08-19 MED ORDER — ALTEPLASE 2 MG IJ SOLR: 2.0000 mg | Freq: Once | INTRAMUSCULAR | Status: DC | PRN

## 2019-08-19 MED ORDER — HEPARIN SODIUM (PORCINE) 1000 UNIT/ML DIALYSIS: 20.0000 [IU]/kg | INTRAMUSCULAR | Status: DC | PRN

## 2019-08-19 MED ORDER — PENTAFLUOROPROP-TETRAFLUOROETH EX AERO: 1.0000 "application " | INHALATION_SPRAY | CUTANEOUS | Status: DC | PRN

## 2019-08-19 MED ORDER — SODIUM CHLORIDE 0.9 % IV SOLN
1.0000 g | INTRAVENOUS | Status: DC
  Administered 2019-08-19: 1 g via INTRAVENOUS
  Filled 2019-08-19 (×2): qty 1

## 2019-08-19 MED ORDER — LIDOCAINE HCL (PF) 1 % IJ SOLN: 5.0000 mL | INTRAMUSCULAR | Status: DC | PRN

## 2019-08-19 MED ORDER — CHLORHEXIDINE GLUCONATE CLOTH 2 % EX PADS
6.0000 | MEDICATED_PAD | Freq: Every day | CUTANEOUS | Status: DC
  Administered 2019-08-19 – 2019-08-24 (×5): 6 via TOPICAL

## 2019-08-19 MED ORDER — HEPARIN SODIUM (PORCINE) 1000 UNIT/ML DIALYSIS: 1000.0000 [IU] | INTRAMUSCULAR | Status: DC | PRN

## 2019-08-19 MED ORDER — LIDOCAINE-PRILOCAINE 2.5-2.5 % EX CREA
1.0000 "application " | TOPICAL_CREAM | CUTANEOUS | Status: DC | PRN
  Filled 2019-08-19: qty 5

## 2019-08-19 NOTE — Progress Notes (Signed)
[facility]GRESS [facility]  Assessment/ Plan: Pt is a 74 y.o. yo male with a chronic hypertension, ESRD on HD at [facility], had recent Covid vaccine, admitted with weakness, diarrhea and hypotension.  He received IV fluid in the ER.  #Hypotension: Patient reported that his blood pressure usually around 70s before restarting dialysis.  He is on midodrine.  Asymptomatic from hypertension.  Currently he is on a small dose of levo.  With systolic blood pressure around 80 to 90s.  He looks clinically stable.  We will attempt intermittent hemodialysis today probably without any ultrafiltration.  Volume status looks acceptable.  He has AV fistula for the access.  Increase midodrine to 15 mg 3 times daily.  # Anemia of CKD: Hemoglobin at goal.  # Secondary hyperparathyroidism: Check phosphorus level.  #Leukocytosis: Cultures are negative.  Received cefepime and vancomycin.  Per primary team.  Subjective: Seen and examined in ICU.  Sitting on bed comfortable.  Denies nausea, vomiting, chest pain, shortness of breath, headache, dizziness or lightheadedness.   Objective Vital signs in last 24 hours: Vitals:   08/19/19 0645 08/19/19 0700 08/19/19 0715 08/19/19 0730  BP: (!) 82/57 (!) 72/52 (!) 76/53 (!) 87/59  Pulse: 72 71 70 72  Resp: (!) 21 17 (!) 23 (!) 22  Temp:    (!) 97.4 F (36.3 C)  TempSrc:    Oral  SpO2: 100% 96% 98% 99%  Weight:      Height:       Weight change:   Intake/Output Summary (Last 24 hours) at 08/19/2019 0748 Last data filed at 08/19/2019 0600 Gross per 24 hour  Intake 2949.73 ml  Output --  Net 2949.73 ml       Labs: Basic Metabolic Panel: Recent Labs  Lab 08/18/19 1120 08/19/19 0245  NA 138 140  K 4.2 4.6  CL 98 100  CO2 17* 20*  GLUCOSE 82 96  BUN 66* 68*  CREATININE 8.52* 8.59*  CALCIUM 8.3* 8.2*   Liver Function Tests: Recent Labs  Lab 08/18/19 1120 08/19/19 0245  AST 29 51*  ALT 21 28  ALKPHOS 135* 131*  BILITOT  1.8* 1.1  PROT 7.8 7.2  ALBUMIN 2.9* 2.6*   Recent Labs  Lab 08/18/19 1120  LIPASE 26   No results for input(s): AMMONIA in the last 168 hours. CBC: Recent Labs  Lab 08/18/19 1120 08/19/19 0245  WBC 14.1* 20.6*  NEUTROABS 13.1*  --   HGB 11.1* 11.2*  HCT 35.1* 34.4*  MCV 102.3* 100.3*  PLT 109* 113*   Cardiac Enzymes: No results for input(s): CKTOTAL, CKMB, CKMBINDEX, TROPONINI in the last 168 hours. CBG: No results for input(s): GLUCAP in the last 168 hours.  Iron Studies: No results for input(s): IRON, TIBC, TRANSFERRIN, FERRITIN in the last 72 hours. Studies/Results: DG Chest Port 1 View  Result Date: 08/18/2019 CLINICAL DATA:  Sepsis, weakness and diarrhea EXAM: PORTABLE CHEST 1 VIEW COMPARISON:  10/06/2018 FINDINGS: Four lead pacer defibrillator power pack over left chest. Leads unchanged relative to previous exam. Lung volumes are low with buckling of the trachea. Accentuation of cardiac silhouette due to low lung volumes and portable technique with similar appearance heart size is enlarged likely unchanged. Increased interstitial markings bilaterally with areas of juxta diaphragmatic airspace disease at the lung bases. Visualized skeletal structures are unremarkable. IMPRESSION: Low lung volumes with increased interstitial markings bilaterally with areas of juxta diaphragmatic airspace disease at the lung bases. Probable background pulmonary edema with small fissural  fluid along the minor fissure in the right chest. Airspace disease at the lung bases likely atelectasis. Electronically Signed   By: Zetta Bills M.D.   On: 08/18/2019 11:32    Medications: Infusions: . sodium chloride 10 mL/hr at 08/18/19 2303  . sodium chloride 10 mL/hr at 08/19/19 0600  . [START ON 08/20/2019] ceFEPime (MAXIPIME) IV    . metronidazole 500 mg (08/19/19 8264)  . norepinephrine (LEVOPHED) Adult infusion 6 mcg/min (08/19/19 0600)    Scheduled Medications: . Chlorhexidine Gluconate Cloth   6 each Topical Daily  . cinacalcet  60 mg Oral Daily  . gabapentin  100 mg Oral QHS  . heparin injection (subcutaneous)  5,000 Units Subcutaneous Q8H  . latanoprost  1 drop Both Eyes QHS  . mouth rinse  15 mL Mouth Rinse BID  . midodrine  15 mg Oral TID WC  . multivitamin  1 tablet Oral QHS  . pantoprazole (PROTONIX) IV  40 mg Intravenous Q24H    have reviewed scheduled and prn medications.  Physical Exam: General:NAD, comfortable Heart:RRR, s1s2 nl Lungs:clear b/l, no crackle Abdomen:soft, Non-tender, non-distended Extremities: Bilateral BKA Dialysis Access: Left upper extremity AV fistula has good thrill and bruit  Clayton [name] Clayton [name] 08/19/2019,7:48 AM  LOS: 1 day  Pager: [phone]

## 2019-08-19 NOTE — Progress Notes (Signed)
MD made aware pt has levophed running through a peripheral IV. No central line at this time. Pt to have dialysis today and wean down if able. Will continue to monitor closely.

## 2019-08-19 NOTE — Progress Notes (Signed)
PULMONARY / CRITICAL CARE MEDICINE   NAME:  Clayton [name], MRN:  [id number], DOB:  Aug 22, 1945, LOS: 1 ADMISSION DATE:  08/18/2019, CONSULTATION DATE:  08/18/2019 REFERRING MD:  Dr. 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  Hx of ESRD on HD M/W/F.  He wasn't able to get HD on 08/15/19 due to feeling weak.  Started having nausea, vomiting, and diarrhea about 2 weeks ago.  He reports bilious emesis material.  No hematemesis, melena, or hematochezia.  No recent sick exposures.  Denies fever.  Has mild abdominal discomfort.  Was able to eat food in PM on 4/04.  Denies feeling short of breath or having chest pain.  No recent change in medications.  He received 2nd dose of [place] vaccine on 08/16/19.  He reports hx of chronic hypotension with baseline SBP in 80's.  His BP initially in ER was 53/38 and improved after getting 2.5 liters normal saline in ER.  Lipase was normal, initial lactic acid was 5.1.  SIGNIFICANT PAST MEDICAL HISTORY   CHF, ESRD, DM, CAD, s/p AICD, PAD s/p b/l BKA  SIGNIFICANT EVENTS:  4/05 Admit  STUDIES:   4/5 CXR>>Low lung volumes with increased interstitial markings bilaterally with areas of juxta diaphragmatic airspace disease at the lung bases. Probable background pulmonary edema with small fissural fluid along the minor fissure in the right chest. Airspace disease at the lung bases likely atelectasis.  CULTURES:  SARS CoV2 4/05 >> RVP 4/5>>negative C diff PCR 4/05 >>negative GI pathogen 4/05 >> Blood 4/05 >>   ANTIBIOTICS:  Vancomycin 4/05  Cefepime 4/05 >> Flagyl 4/05 >>  LINES/TUBES:  n/a  CONSULTANTS:  Nephrology  SUBJECTIVE:  Hypotensive overnight.   CONSTITUTIONAL: BP (!) 90/54   Pulse 72   Temp (!) 97.5 F (36.4 C) (Oral)   Resp 19   Ht 5\' 5"  (1.651 m)   Wt 80.5 kg   SpO2 100%   BMI 29.53 kg/m   I/O last 3 completed shifts: In: 2650 [IV Piggyback:2650] Out:  -         PHYSICAL EXAM:  General - alert HEENT: patent airway Cardiac - irregular, AICD in Lt upper chest Chest - some accessory muscle use, no wheeze, faint b/l rales, speaking in full sentences without stopping to catch his breath Abdomen - soft, mild tenderness in epigastric area, reducible umbilical hernia, no rebound or guarding Extremities - b/l BKA, AV fistula Lt arm Skin - no rashes Neuro - normal strength, moves extremities, follows commands ASSESSMENT AND PLAN    Shock. Likely components of Septic and hypovolemic White count up this morning 14>20 which, along with lactic acidosis and fever (tmax 101) on admission, would support sepsis Pt notes baseline SBP ~75-80. Pressures hanging out around here however requiring levo to do so. Nausea/vomitting/diarrhea.  -- continue cefepime and flagyl -- f/u blood culture, GI pathogen panel -- continue midodrine --pressor support with levo  Acute hypoxic respiratory failure likely 2/2 pulm edema as seen on admission CXR. No supplemental O2 requirement at baseline. On 2L supplemental O2. Likely secondary to pulm edema from missed HD session. Suspect to improve with HD RVP/COVID neg --continue to wean O2 as able  CAD. Present on 2019 echo. HFrEF. CRT present. Echo from 2019 indicates EF <25% Exam not consistent with volume overload however CXR findings possible pulm edema. --echocardiogram ordered to re-evaluate cardiac function --strict I/O, daily weights, cardiac monitoring  ESRD on HD --nephrology following. Planning for a trial of intermittent HD session today while on levo. Will need close blood pressure monitoring. --continue cinacalcet  Metabolic encephalopathy. Suspect attributable to chronic HD/uremia. Does exhibit some mental slowing on exam however unsure if this is new or chronic.  Macrocytic anemia--stable Thrombocytopenia--stable from admission  Neuropathy. - continue neurontin  Best Practice / Goals of  Care / Disposition.   DVT PROPHYLAXIS: SQ heparin SUP: Protonix NUTRITION:Clear liquids MOBILITY: Bed rest GOALS OF CARE: full code FAMILY DISCUSSIONS: daughter. Pt does not wish to have ex-wife involved DISPOSITION ICU  LABS  Glucose No results for input(s): GLUCAP in the last 168 hours.  BMET Recent Labs  Lab 08/18/19 1120 08/19/19 0245  NA 138 140  K 4.2 4.6  CL 98 100  CO2 17* 20*  BUN 66* 68*  CREATININE 8.52* 8.59*  GLUCOSE 82 96    Liver Enzymes Recent Labs  Lab 08/18/19 1120 08/19/19 0245  AST 29 51*  ALT 21 28  ALKPHOS 135* 131*  BILITOT 1.8* 1.1  ALBUMIN 2.9* 2.6*    Electrolytes Recent Labs  Lab 08/18/19 1120 08/19/19 0245  CALCIUM 8.3* 8.2*    CBC Recent Labs  Lab 08/18/19 1120 08/19/19 0245  WBC 14.1* 20.6*  HGB 11.1* 11.2*  HCT 35.1* 34.4*  PLT 109* 113*    ABG No results for input(s): PHART, PCO2ART, PO2ART in the last 168 hours.  Coag's Recent Labs  Lab 08/18/19 1120  APTT 40*  INR 1.8*    Sepsis Markers Recent Labs  Lab 08/18/19 1120 08/18/19 1441 08/18/19 2343  LATICACIDVEN 5.1* 3.0* 1.8    Cardiac Enzymes No results for input(s): TROPONINI, PROBNP in the last 168 hours.

## 2019-08-19 NOTE — Progress Notes (Signed)
[facility] Progress Note Patient Name: Clayton [name] DOB: 1946-01-19 MRN: [id number]   Date of Service  08/19/2019  HPI/Events of Note  Frequent PVC's.  eICU Interventions  Will order: 1. BMP and Mg++ level STAT.     Intervention Category Major Interventions: Electrolyte abnormality - evaluation and management  Rebeca Valdivia Eugene 08/19/2019, 11:21 PM

## 2019-08-19 NOTE — Progress Notes (Signed)
Pharmacy Antibiotic Note  Clayton [name] is a 74 y.o. male admitted on 08/18/2019 with sepsis (possible GI source)  Pharmacy has been consulted for cefepime dosing. He is noted with ESRD for for HD today   Plan: Change Cefepime to 1gm IV q24h F/u renal plans, C&S and clinical status   Height: 5\' 5"  (165.1 cm) Weight: 80.5 kg (177 lb 7.5 oz) IBW/kg (Calculated) : 61.5  Temp (24hrs), Avg:98.2 F (36.8 C), Min:97.4 F (36.3 C), Max:101.1 F (38.4 C)  Recent Labs  Lab 08/18/19 1120 08/18/19 1441 08/18/19 2343 08/19/19 0245  WBC 14.1*  --   --  20.6*  CREATININE 8.52*  --   --  8.59*  LATICACIDVEN 5.1* 3.0* 1.8  --     Estimated Creatinine Clearance: 7.6 mL/min (A) (by C-G formula based on SCr of 8.59 mg/dL (H)).    Allergies  Allergen Reactions  . Simvastatin Other (See Comments)    unknown    Antimicrobials this admission: Vanc 4/5>> 4/6 Cefepime 4/5>> Flagyl x 1 4/5  Dose adjustments this admission: N/A  Microbiology results: 4/5 blood x2  Thank you for allowing pharmacy to be a part of this patient's care.  Hildred Laser, PharmD Clinical Pharmacist **Pharmacist phone directory can now be found on [email or web address] ([email or web address]).  Listed under [place].

## 2019-08-20 ENCOUNTER — Inpatient Hospital Stay (HOSPITAL_COMMUNITY): Payer: Medicare Other

## 2019-08-20 DIAGNOSIS — I5020 Unspecified systolic (congestive) heart failure: Secondary | ICD-10-CM

## 2019-08-20 DIAGNOSIS — I361 Nonrheumatic tricuspid (valve) insufficiency: Secondary | ICD-10-CM

## 2019-08-20 DIAGNOSIS — R6521 Severe sepsis with septic shock: Secondary | ICD-10-CM

## 2019-08-20 LAB — GI PATHOGEN PANEL BY PCR, STOOL

## 2019-08-20 LAB — CBC
HCT: 35 % — ABNORMAL LOW (ref 39.0–52.0)
Hemoglobin: 11.4 g/dL — ABNORMAL LOW (ref 13.0–17.0)
MCH: 32.9 pg (ref 26.0–34.0)
MCHC: 32.6 g/dL (ref 30.0–36.0)
MCV: 100.9 fL — ABNORMAL HIGH (ref 80.0–100.0)
Platelets: 125 10*3/uL — ABNORMAL LOW (ref 150–400)
RBC: 3.47 MIL/uL — ABNORMAL LOW (ref 4.22–5.81)
RDW: 15.8 % — ABNORMAL HIGH (ref 11.5–15.5)
WBC: 13.6 10*3/uL — ABNORMAL HIGH (ref 4.0–10.5)
nRBC: 0 % (ref 0.0–0.2)

## 2019-08-20 LAB — BASIC METABOLIC PANEL
Anion gap: 16 — ABNORMAL HIGH (ref 5–15)
BUN: 34 mg/dL — ABNORMAL HIGH (ref 8–23)
CO2: 22 mmol/L (ref 22–32)
Calcium: 7.5 mg/dL — ABNORMAL LOW (ref 8.9–10.3)
Chloride: 96 mmol/L — ABNORMAL LOW (ref 98–111)
Creatinine, Ser: 5.17 mg/dL — ABNORMAL HIGH (ref 0.61–1.24)
GFR calc Af Amer: 12 mL/min — ABNORMAL LOW (ref >60)
GFR calc non Af Amer: 10 mL/min — ABNORMAL LOW (ref >60)
Glucose, Bld: 86 mg/dL (ref 70–99)
Potassium: 4.4 mmol/L (ref 3.5–5.1)
Sodium: 134 mmol/L — ABNORMAL LOW (ref 135–145)

## 2019-08-20 LAB — ECHOCARDIOGRAM COMPLETE
Height: 65 in
Weight: 2867.74 oz

## 2019-08-20 LAB — MAGNESIUM: Magnesium: 1.9 mg/dL (ref 1.7–2.4)

## 2019-08-20 MED ORDER — PERFLUTREN LIPID MICROSPHERE
INTRAVENOUS | Status: AC
  Filled 2019-08-20: qty 10

## 2019-08-20 MED ORDER — MAGNESIUM SULFATE 2 GM/50ML IV SOLN
2.0000 g | Freq: Once | INTRAVENOUS | Status: AC
  Administered 2019-08-20: 2 g via INTRAVENOUS
  Filled 2019-08-20: qty 50

## 2019-08-20 MED ORDER — SODIUM CHLORIDE 0.9 % IV SOLN
3.0000 g | Freq: Two times a day (BID) | INTRAVENOUS | Status: AC
  Administered 2019-08-20 – 2019-08-24 (×10): 3 g via INTRAVENOUS
  Filled 2019-08-20 (×2): qty 3
  Filled 2019-08-20: qty 8
  Filled 2019-08-20 (×2): qty 3
  Filled 2019-08-20 (×2): qty 8
  Filled 2019-08-20 (×2): qty 3
  Filled 2019-08-20: qty 8
  Filled 2019-08-20 (×2): qty 3

## 2019-08-20 MED ORDER — ENSURE ENLIVE PO LIQD
237.0000 mL | Freq: Two times a day (BID) | ORAL | Status: DC
  Administered 2019-08-20 – 2019-08-25 (×7): 237 mL via ORAL

## 2019-08-20 NOTE — Progress Notes (Signed)
  Echocardiogram 2D Echocardiogram has been performed.  Clayton [name] 08/20/2019, 2:10 PM

## 2019-08-20 NOTE — Progress Notes (Signed)
Initial Nutrition Assessment  DOCUMENTATION CODES:   Severe malnutrition in context of chronic illness  INTERVENTION:   Ensure Enlive po BID, each supplement provides 350 kcal and 20 grams of protein  Continue Rena-Vit daily  If po intake inadequate on follow-up, recommend liberalization of diet   NUTRITION DIAGNOSIS:   Severe Malnutrition related to chronic illness as evidenced by severe fat depletion, severe muscle depletion.  GOAL:   Patient will meet greater than or equal to 90% of their needs  MONITOR:   PO intake, Supplement acceptance, Labs, Weight trends  REASON FOR ASSESSMENT:   Malnutrition Screening Tool    ASSESSMENT:   74 yo male presents with hx of N/V/D x 2 weeks and admitted with shock, septic vs cardiogenic shock and acute respiratory failure likely from pulmonary edema. PMH includes ESRD/HD, chronic hypotension, PAD s/p b/l BKA, CHF, DM.   Pt remains on levophed; pt is lethargic but arousable.   Pt tolerated CL tray this AM and diet advanced. Pt ate 75% at lunch today. Pt reports appetite is ok. Pt has been eating only liquid at home for the last several days and prior to this only "softer" foods since initiation of N/V/D 2 weeks ago. Pt does reports he likes Ensure and   Pt reports he has an aid at home 5 days a week who helps him around the house with things including grocery shopping and cooking.   Pt reports he has been on HD for 12 years. He take his renal MVI daily; pt reports he currently is not on a phosphorus binder  Pt reports pre-amputation height 6 foot 5 inches tall. Pt with bilateral BKA  Pt reports outpatient EDW around 75 kg; current weight 81 kg  Labs: sodium 134 (L), potassium wdl, no recent phosphorus available Meds: sensipar, Rena-Vit   NUTRITION - FOCUSED PHYSICAL EXAM:    Most Recent Value  Orbital Region  Severe depletion  Upper Arm Region  Moderate depletion  Thoracic and Lumbar Region  Moderate depletion  Buccal  Region  Severe depletion  Temple Region  Severe depletion  Clavicle Bone Region  Severe depletion  Clavicle and Acromion Bone Region  Severe depletion  Scapular Bone Region  Severe depletion  Dorsal Hand  Moderate depletion  Patellar Region  Moderate depletion  Anterior Thigh Region  Moderate depletion  Posterior Calf Region  Unable to assess [b/l BKA]  Edema (RD Assessment)  None       Diet Order:   Diet Order            Diet renal with fluid restriction Fluid restriction: 1200 mL Fluid; Room service appropriate? Yes; Fluid consistency: Thin  Diet effective now              EDUCATION NEEDS:   Education needs have been addressed  Skin:  Skin Assessment: Reviewed RN Assessment  Last BM:  4/6  Height:   Ht Readings from Last 1 Encounters:  08/20/19 6\' 5"  (1.956 m)    Weight:   Wt Readings from Last 1 Encounters:  08/20/19 81.3 kg   BMI:  Body mass index is 21.25 kg/m.   Adjusted IBW: 80.9 kg (b/l BKA)  Estimated Nutritional Needs:   Kcal:  2000-2200 kcals  Protein:  100-115 g  Fluid:  1000 mL plus UOP   Kerman Passey MS, RDN, LDN, CNSC RD Pager Number and Weekend/On-Call After Hours Pager Located in [place]

## 2019-08-20 NOTE — Progress Notes (Signed)
[facility] PROGRESS NOTE  Assessment/ Plan: Pt is a 74 y.o. yo male with a chronic hypertension, ESRD on HD at [facility], had recent Covid vaccine, admitted with weakness, diarrhea and hypotension.  He received IV fluid in the ER.  #Hypotension: Patient reported that his blood pressure usually around 70s before dialysis at OP.  He is on midodrine.  Asymptomatic from hypotension.  Currently he is on Levo wth systolic blood pressure around 80 to 90s.  He looks clinically stable.  Increased midodrine to 15 mg 3 times daily.  #ESRD on HD at [facility], MWF: Status post HD yesterday with UF.  His volume status and potassium looks acceptable.  No plan for HD today.  We will do daily assessment.  He has AV fistula for the access.  # Anemia of CKD: Hemoglobin at goal.  # Secondary hyperparathyroidism: Check phosphorus level.  #Leukocytosis: Cultures are negative.  On cefepime and vancomycin.  Per primary team.  Subjective: Seen and examined in ICU.  No new event.  Still receiving Levophed at 10 mics.  Denies nausea vomiting headache dizziness.  Tolerated HD well. Objective Vital signs in last 24 hours: Vitals:   08/20/19 0630 08/20/19 0645 08/20/19 0700 08/20/19 0705  BP: (!) 78/55 (!) 89/52 (!) 71/49 (!) 74/52  Pulse: 78  88 90  Resp: (!) 22 (!) 23 19 (!) 22  Temp:      TempSrc:      SpO2: 91%  100% 100%  Weight:      Height:       Weight change: 7.9 kg  Intake/Output Summary (Last 24 hours) at 08/20/2019 0754 Last data filed at 08/20/2019 0700 Gross per 24 hour  Intake 1319.8 ml  Output 500 ml  Net 819.8 ml       Labs: Basic Metabolic Panel: Recent Labs  Lab 08/18/19 1120 08/19/19 0245 08/19/19 2351  NA 138 140 134*  K 4.2 4.6 4.4  CL 98 100 96*  CO2 17* 20* 22  GLUCOSE 82 96 86  BUN 66* 68* 34*  CREATININE 8.52* 8.59* 5.17*  CALCIUM 8.3* 8.2* 7.5*   Liver Function Tests: Recent Labs  Lab 08/18/19 1120 08/19/19 0245  AST 29 51*   ALT 21 28  ALKPHOS 135* 131*  BILITOT 1.8* 1.1  PROT 7.8 7.2  ALBUMIN 2.9* 2.6*   Recent Labs  Lab 08/18/19 1120  LIPASE 26   No results for input(s): AMMONIA in the last 168 hours. CBC: Recent Labs  Lab 08/18/19 1120 08/18/19 1120 08/19/19 0245 08/19/19 1114 08/20/19 0704  WBC 14.1*   < > 20.6* 16.7* 13.6*  NEUTROABS 13.1*  --   --   --   --   HGB 11.1*   < > 11.2* 10.9* 11.4*  HCT 35.1*   < > 34.4* 32.9* 35.0*  MCV 102.3*  --  100.3* 99.1 100.9*  PLT 109*   < > 113* 122* 125*   < > = values in this interval not displayed.   Cardiac Enzymes: No results for input(s): CKTOTAL, CKMB, CKMBINDEX, TROPONINI in the last 168 hours. CBG: No results for input(s): GLUCAP in the last 168 hours.  Iron Studies: No results for input(s): IRON, TIBC, TRANSFERRIN, FERRITIN in the last 72 hours. Studies/Results: DG Chest Port 1 View  Result Date: 08/18/2019 CLINICAL DATA:  Sepsis, weakness and diarrhea EXAM: PORTABLE CHEST 1 VIEW COMPARISON:  10/06/2018 FINDINGS: Four lead pacer defibrillator power pack over left chest. Leads unchanged relative to previous  exam. Lung volumes are low with buckling of the trachea. Accentuation of cardiac silhouette due to low lung volumes and portable technique with similar appearance heart size is enlarged likely unchanged. Increased interstitial markings bilaterally with areas of juxta diaphragmatic airspace disease at the lung bases. Visualized skeletal structures are unremarkable. IMPRESSION: Low lung volumes with increased interstitial markings bilaterally with areas of juxta diaphragmatic airspace disease at the lung bases. Probable background pulmonary edema with small fissural fluid along the minor fissure in the right chest. Airspace disease at the lung bases likely atelectasis. Electronically Signed   By: Zetta Bills M.D.   On: 08/18/2019 11:32    Medications: Infusions: . sodium chloride Stopped (08/20/19 0606)  . sodium chloride 10 mL/hr at  08/19/19 1900  . sodium chloride    . sodium chloride    . ceFEPime (MAXIPIME) IV Stopped (08/19/19 2225)  . metronidazole 100 mL/hr at 08/20/19 0700  . norepinephrine (LEVOPHED) Adult infusion 10 mcg/min (08/20/19 0700)    Scheduled Medications: . Chlorhexidine Gluconate Cloth  6 each Topical Daily  . cinacalcet  60 mg Oral Daily  . gabapentin  100 mg Oral QHS  . heparin injection (subcutaneous)  5,000 Units Subcutaneous Q8H  . latanoprost  1 drop Both Eyes QHS  . mouth rinse  15 mL Mouth Rinse BID  . midodrine  15 mg Oral TID WC  . multivitamin  1 tablet Oral QHS  . pantoprazole (PROTONIX) IV  40 mg Intravenous Q24H    have reviewed scheduled and prn medications.  Physical Exam: General:NAD, comfortable, able to lie on bed comfortable. Heart:RRR, s1s2 nl Lungs:clear b/l, no crackle Abdomen:soft, Non-tender, non-distended Extremities: Bilateral BKA Dialysis Access: Left upper extremity AV fistula has good thrill and bruit  Vance Belcourt Tanna Furry 08/20/2019,7:54 AM  LOS: 2 days  Pager: [phone]

## 2019-08-20 NOTE — Progress Notes (Signed)
Attempted echo.  Patient wants to finish eating

## 2019-08-20 NOTE — Progress Notes (Addendum)
PULMONARY / CRITICAL CARE MEDICINE   NAME:  Clayton [name], MRN:  [id number], DOB:  January 25, 1946, LOS: 2 ADMISSION DATE:  08/18/2019, CONSULTATION DATE:  08/18/2019 REFERRING MD:  Dr. Sherry Ruffing, ER, CHIEF COMPLAINT:  Diarrhea  BRIEF HISTORY:    ARSEN MANGIONE is a 74 yo male veteran who receives care at [place] and [place].  Hx of ESRD on HD M/W/F and chronic hypotension.  He wasn't able to get HD on 08/15/19 due to feeling weak.  Started having nausea, vomiting, and diarrhea about 2 weeks ago.  He received 2nd dose of [place] vaccine on 08/16/19.  He reports hx of chronic hypotension with baseline SBP in 80's.  His BP initially in ER was 53/38 and improved after getting 2.5 liters normal saline in ER.   SIGNIFICANT PAST MEDICAL HISTORY   CHF, ESRD, DM, CAD, s/p AICD, PAD s/p b/l BKA  SIGNIFICANT EVENTS:  4/05 Admit  STUDIES:   4/5 CXR>>Low lung volumes with increased interstitial markings bilaterally with areas of juxta diaphragmatic airspace disease at the lung bases. Probable background pulmonary edema with small fissural fluid along the minor fissure in the right chest. Airspace disease at the lung bases likely atelectasis.  CULTURES:  SARS CoV2 4/05 >>NEG RVP 4/5>>negative C diff PCR 4/05 >>negative GI pathogen 4/05 >> Blood 4/05 >> NGTD  ANTIBIOTICS:  Vancomycin 4/05>>4/6 Cefepime 4/05 >> Flagyl 4/05 >>  LINES/TUBES:  n/a  CONSULTANTS:  Nephrology  SUBJECTIVE:  Increased levo requirement overnight.frequent PVCs noted as well  CONSTITUTIONAL: BP (!) 69/56 (BP Location: Right Arm)   Pulse 91   Temp 98.2 F (36.8 C) (Oral)   Resp 20   Ht 5\' 5"  (1.651 m)   Wt 81.3 kg   SpO2 98%   BMI 29.83 kg/m   I/O last 3 completed shifts: In: 1619.5 [I.V.:1030.2; IV Piggyback:589.4] Out: 500 [Other:500]        PHYSICAL EXAM: General - alert HEENT: mild swelling at right posterior mandible. Poor dentition but no obvious abscess or drainage in oral  cavity Cardiac - irregular, AICD in Lt upper chest, radial pulses intact. JVD elevated to angle of mandible Chest - on 3L [place]. Bilateral crackles appreciated Abdomen - soft, mild tenderness in epigastric area, reducible umbilical hernia, no rebound or guarding Extremities - b/l BKA, AV fistula Lt arm  Skin - no rashes Neuro - normal strength, moves extremities, follows commands ASSESSMENT AND PLAN    Shock. Septic vs cardiogenic  Septic shock. GI vs device infection vs other. White count back down to 13 this morning. Afebrile overnight. NG on blood cultures.  He has several possible sites and given his immunocompromised state with ESRD and improvement in fever/white count with abx, I think abx should be continued to complete 7d. Nausea/vomitting/diarrhea. No n/v/d since last afternoon. -- transition to unasyn--tx day #3/7 -- continue to follow blood culture, GI pathogen panel  Progression of Severe HFrEF vs cardiogenic shock.  Last echo in 2019 with EF<25%. Obtaining echo now to re-evaluate for suspected progression.  Progressive hypotension overnight requiring increase in levo to 10 this morning.  -- continue midodrine 15mg  tid  --pressor support with levo --echocardiogram ordered to re-evaluate cardiac function --strict I/O, daily weights, cardiac monitoring  Acute hypoxic respiratory failure likely 2/2 pulm edema as seen on admission CXR. Likely secondary to pulm edema from missed HD session and progression of his severe heart failure. Increase in supplemental O2 overnight to 3L. Obtained a repeat CXR this morning which was somewhat  difficult to interpret due to low lung volumes however possibly shows some interval worsening in pulm edema. --continue to wean O2 as able  ESRD on HD --nephrology following. Tolerated HD well yesterday with pressor support.  --continue cinacalcet  Macrocytic anemia--stable Thrombocytopenia--stable from admission  Neuropathy. - continue  neurontin  Best Practice / Goals of Care / Disposition.   DVT PROPHYLAXIS: SQ heparin SUP: Protonix NUTRITION:renal diet MOBILITY: Bed rest GOALS OF CARE: full code FAMILY DISCUSSIONS: will try to have discussion regarding [place] with pt and his daughter today DISPOSITION ICU  LABS  Glucose No results for input(s): GLUCAP in the last 168 hours.  BMET Recent Labs  Lab 08/18/19 1120 08/19/19 0245 08/19/19 2351  NA 138 140 134*  K 4.2 4.6 4.4  CL 98 100 96*  CO2 17* 20* 22  BUN 66* 68* 34*  CREATININE 8.52* 8.59* 5.17*  GLUCOSE 82 96 86    Liver Enzymes Recent Labs  Lab 08/18/19 1120 08/19/19 0245  AST 29 51*  ALT 21 28  ALKPHOS 135* 131*  BILITOT 1.8* 1.1  ALBUMIN 2.9* 2.6*    Electrolytes Recent Labs  Lab 08/18/19 1120 08/19/19 0245 08/19/19 2351  CALCIUM 8.3* 8.2* 7.5*  MG  --   --  1.9    CBC Recent Labs  Lab 08/19/19 0245 08/19/19 1114 08/20/19 0704  WBC 20.6* 16.7* 13.6*  HGB 11.2* 10.9* 11.4*  HCT 34.4* 32.9* 35.0*  PLT 113* 122* 125*    ABG No results for input(s): PHART, PCO2ART, PO2ART in the last 168 hours.  Coag's Recent Labs  Lab 08/18/19 1120  APTT 40*  INR 1.8*    Sepsis Markers Recent Labs  Lab 08/18/19 1120 08/18/19 1441 08/18/19 2343  LATICACIDVEN 5.1* 3.0* 1.8     I agree with the resident's note, impression, and recommendations as outlined. I have taken an independent interval history, reviewed the chart and examined the patient.  My medical decision making is as follows:   Subjective: Late entry: patient seen and examined 4/7 during morning rounds and again in the afternoon. More alert in the afternoon during visit with daughter. On 47mcg of norepinephrine. Alert despite cuff pressures reading low  Objective: Vitals:   08/21/19 0700 08/21/19 0717  BP: (!) 87/75   Pulse: 81   Resp: 20   Temp:  98.2 F (36.8 C)  SpO2: 100%     Gen:       Chronically ill.  Lungs:    Clear to auscultation bilaterally;  normal respiratory effort CV:         RRR, soft systolic murmur. BiV-ICD pocket clean, no erythema or edema Abd:      Soft, nondistended, nontender Ext:    Bilateral BKA Neuro:    alert and oriented x 3  Labs/Imaging: Chest xray 4/7 with cardiomegaly and pulmonary edema  Assessment and Plan: Circulatory/septic shock ESRD Severe ICM EF<25% s/p BiV-ICD  Plan is for empiric abx - suspected intra-abdominal infection. Symptoms and WBC improving with treatment.  I discussed at length with the daughter and patient at the bedside that he has been on dialysis for 12 years and has had worsening hypotension which may now be limiting therapy. Will attempt to titrate down vasopressors to mental status. Baseline SBP is 70-80 in dialysis. I expressed my concern that hypotension may get to the point that even if we are able to finish treating his sepsis, he may be approaching the end stages of life support with dialysis, especially in the  setting of his cardiomyopathy.   The patient is critically ill with multiple organ systems failure and requires high complexity decision making for assessment and support, frequent evaluation and titration of therapies, application of advanced monitoring technologies and extensive interpretation of multiple databases.   Critical Care Time devoted to patient care services described in this note is 47 minutes. This time reflects time of care of this [facility] [facility] . This critical care time does not reflect separately billable procedures or procedure time, teaching time and supervisory time of PA/NP/Med student/Med Resident etc but could involve care discussion time.  [facility] 08/21/2019 7:50 AM  Pager: [phone] After hours pager: [phone]

## 2019-08-21 DIAGNOSIS — E43 Unspecified severe protein-calorie malnutrition: Secondary | ICD-10-CM | POA: Insufficient documentation

## 2019-08-21 LAB — CBC
HCT: 33.3 % — ABNORMAL LOW (ref 39.0–52.0)
Hemoglobin: 10.8 g/dL — ABNORMAL LOW (ref 13.0–17.0)
MCH: 32.7 pg (ref 26.0–34.0)
MCHC: 32.4 g/dL (ref 30.0–36.0)
MCV: 100.9 fL — ABNORMAL HIGH (ref 80.0–100.0)
Platelets: 129 10*3/uL — ABNORMAL LOW (ref 150–400)
RBC: 3.3 MIL/uL — ABNORMAL LOW (ref 4.22–5.81)
RDW: 15.6 % — ABNORMAL HIGH (ref 11.5–15.5)
WBC: 13.4 10*3/uL — ABNORMAL HIGH (ref 4.0–10.5)
nRBC: 0.1 % (ref 0.0–0.2)

## 2019-08-21 LAB — BASIC METABOLIC PANEL
Anion gap: 17 — ABNORMAL HIGH (ref 5–15)
BUN: 41 mg/dL — ABNORMAL HIGH (ref 8–23)
CO2: 18 mmol/L — ABNORMAL LOW (ref 22–32)
Calcium: 7 mg/dL — ABNORMAL LOW (ref 8.9–10.3)
Chloride: 99 mmol/L (ref 98–111)
Creatinine, Ser: 5.9 mg/dL — ABNORMAL HIGH (ref 0.61–1.24)
GFR calc Af Amer: 10 mL/min — ABNORMAL LOW (ref >60)
GFR calc non Af Amer: 9 mL/min — ABNORMAL LOW (ref >60)
Glucose, Bld: 130 mg/dL — ABNORMAL HIGH (ref 70–99)
Potassium: 4.6 mmol/L (ref 3.5–5.1)
Sodium: 134 mmol/L — ABNORMAL LOW (ref 135–145)

## 2019-08-21 MED ORDER — MIDODRINE HCL 5 MG PO TABS
20.0000 mg | ORAL_TABLET | Freq: Three times a day (TID) | ORAL | Status: DC
  Administered 2019-08-21 – 2019-08-25 (×12): 20 mg via ORAL
  Filled 2019-08-21 (×15): qty 4

## 2019-08-21 MED ORDER — CHLORHEXIDINE GLUCONATE CLOTH 2 % EX PADS
6.0000 | MEDICATED_PAD | Freq: Every day | CUTANEOUS | Status: DC
  Administered 2019-08-21 – 2019-08-24 (×4): 6 via TOPICAL

## 2019-08-21 NOTE — Progress Notes (Addendum)
PULMONARY / CRITICAL CARE MEDICINE   NAME:  Clayton [name], MRN:  [id number], DOB:  [birth year], LOS: 3 ADMISSION DATE:  08/18/2019, CONSULTATION DATE:  08/18/2019 REFERRING MD:  Dr. Sherry Ruffing, ER, CHIEF COMPLAINT:  Diarrhea  BRIEF HISTORY:    Clayton [name] is a 74 yo male veteran who receives care at [place] and [place].  Hx of ESRD on HD M/W/F and chronic hypotension.  He wasn't able to get HD on 08/15/19 due to feeling weak.  Started having nausea, vomiting, and diarrhea about 2 weeks ago.  He received 2nd dose of [place] vaccine on 08/16/19.  He reports hx of chronic hypotension with baseline SBP in 80's.  His BP initially in ER was 53/38 and improved after getting 2.5 liters normal saline in ER.   SIGNIFICANT PAST MEDICAL HISTORY   CHF, ESRD, DM, CAD, s/p AICD, PAD s/p b/l BKA  SIGNIFICANT EVENTS:  4/05 Admit 4/7>>echo showing EF<20% 4/8>>palliative care consulted. Work on weaning levo  STUDIES:   4/5 CXR>>Low lung volumes with increased interstitial markings bilaterally with areas of juxta diaphragmatic airspace disease at the lung bases. Probable background pulmonary edema with small fissural fluid along the minor fissure in the right chest. Airspace disease at the lung bases likely atelectasis.  4/7 echocardiogram>>EF <20% with global hypokinesis and areas of akinesis of the LV  CULTURES:  SARS CoV2 4/05 >>NEG RVP 4/5>>negative C diff PCR 4/05 >>negative GI pathogen 4/05 >>NEG Blood 4/05 >> NGTD  ANTIBIOTICS:  Vancomycin 4/05>>4/6 Cefepime 4/05 >>4/07 Flagyl 4/05 >>4/07 Unasyn 4/07>>  LINES/TUBES:  n/a  CONSULTANTS:  Nephrology Palliative  SUBJECTIVE:  Discussed echo results with him.  CONSTITUTIONAL: BP (!) 78/52   Pulse 83   Temp 98.2 F (36.8 C)   Resp 19   Ht 6\' 5"  (1.956 m)   Wt 85.1 kg   SpO2 94%   BMI 22.25 kg/m   I/O last 3 completed shifts: In: 2239.7 [P.O.:600; I.V.:1089.4; IV Piggyback:550.3] Out: -     PHYSICAL EXAM: General  - alert HEENT: mild swelling at right posterior mandible. Poor dentition but no obvious abscess or drainage in oral cavity Cardiac -AICD present in left upper chest, regular rate, irregular rhythm, no peripheral edema, JVD to angle of the mandible. Chest - on 3L [place]. Bilateral crackles appreciated Abdomen - bs active Extremities - b/l BKA, AV fistula Lt arm  Skin - no rashes Neuro - normal strength, moves extremities, follows commands ASSESSMENT AND PLAN    Shock. Septic vs cardiogenic  Progression of Severe HFrEF vs cardiogenic shock. Echo showing EF<20% Suspect that this is progression of his heart failure which was causing the pulmonary and gut edema contributing to his shortness of breath and GI sx on admission. Given his chronic hypotension, I think it's reasonable to titrate his levophed to mental status and symptoms  Acute hypoxic respiratory failure 2/2 pulmonary edema. Remains on 3L supplemental O2 --midodrine increased to 20mg  tid --titrate levo to mental status --consider iron panel/infusion --strict I/O, daily weights, cardiac monitoring  --wean supplemental O2 as able --palliative care consulted.  Septic shock. GI vs device infection vs other. white count stable at 13. Remains afebrile since admission.  NG on blood cultures. GI panel negative. He has several possible sites and given his immunocompromised state with ESRD and improvement in fever/white count with abx, I think abx should be continued to complete 7d. Nausea/vomitting/diarrhea. GI panel/C-diff negative -- continue unasyn--tx day #4/7 -- continue to follow blood culture  ESRD on  HD --nephrology following. Planning to hold off on HD today given his soft BP despite levophed --continue cinacalcet  Macrocytic anemia--stable Thrombocytopenia--stable from admission  Neuropathy. - continue neurontin  Best Practice / Goals of Care / Disposition.   DVT PROPHYLAXIS: SQ heparin SUP: Protonix NUTRITION:renal  diet MOBILITY: OOB with assist GOALS OF CARE: full code FAMILY DISCUSSIONS: will continue discussions regarding [place] with pt and his daughter today. Palliative now on board DISPOSITION ICU  LABS  Glucose No results for input(s): GLUCAP in the last 168 hours.  BMET Recent Labs  Lab 08/19/19 0245 08/19/19 2351 08/21/19 0224  NA 140 134* 134*  K 4.6 4.4 4.6  CL 100 96* 99  CO2 20* 22 18*  BUN 68* 34* 41*  CREATININE 8.59* 5.17* 5.90*  GLUCOSE 96 86 130*    Liver Enzymes Recent Labs  Lab 08/18/19 1120 08/19/19 0245  AST 29 51*  ALT 21 28  ALKPHOS 135* 131*  BILITOT 1.8* 1.1  ALBUMIN 2.9* 2.6*    Electrolytes Recent Labs  Lab 08/19/19 0245 08/19/19 2351 08/21/19 0224  CALCIUM 8.2* 7.5* 7.0*  MG  --  1.9  --     CBC Recent Labs  Lab 08/19/19 1114 08/20/19 0704 08/21/19 0224  WBC 16.7* 13.6* 13.4*  HGB 10.9* 11.4* 10.8*  HCT 32.9* 35.0* 33.3*  PLT 122* 125* 129*    ABG No results for input(s): PHART, PCO2ART, PO2ART in the last 168 hours.  Coag's Recent Labs  Lab 08/18/19 1120  APTT 40*  INR 1.8*    Sepsis Markers Recent Labs  Lab 08/18/19 1120 08/18/19 1441 08/18/19 2343  LATICACIDVEN 5.1* 3.0* 1.8

## 2019-08-21 NOTE — Progress Notes (Signed)
Clayton [name]  Assessment/ Plan: Pt is a 74 y.o. yo male with a chronic hypertension, ESRD on HD at [facility], had recent Covid vaccine, admitted with weakness, diarrhea and hypotension.  He received IV fluid in the ER.  #Hypotension: Patient reported that his blood pressure usually around 70s before dialysis at OP.  He is on midodrine.  Asymptomatic from hypotension.  Currently he is on Levo wth systolic blood pressure around 70 to 90s.  He looks clinically stable.  Increase midodrine to 20 mg 3 times daily.  #Chronic systolic CHF: EF less than 20%.  This is probably the contributing factor for hypotension.  #ESRD on HD at [facility], MWF: Last HD on 4/6, tolerated well.  His blood pressure is still soft requiring Levophed.  Volume status and labs okay.  Plan for dialysis tomorrow.   He has AV fistula for the access.  If he is not unable to tolerate dialysis then palliative care is appropriate given severe CHF.  # Anemia of CKD: Hemoglobin at goal.  # Secondary hyperparathyroidism: Check phosphorus level.  #Leukocytosis: Cultures are negative.  On Unasyn empirically.  Subjective: Seen and examined in ICU.  No new event.  Unable to wean off Levophed.  Blood pressure remains low.  Denies nausea vomiting chest pain shortness of breath. Objective Vital signs in last 24 hours: Vitals:   08/21/19 0500 08/21/19 0600 08/21/19 0700 08/21/19 0717  BP:  (!) 82/55 (!) 87/75   Pulse: 83 73 81   Resp: 17 16 20    Temp:    98.2 F (36.8 C)  TempSrc:      SpO2: 100% 100% 100%   Weight: 85.1 kg     Height:       Weight change: 4.6 kg  Intake/Output Summary (Last 24 hours) at 08/21/2019 0802 Last data filed at 08/21/2019 0600 Gross per 24 hour  Intake 1495.09 ml  Output --  Net 1495.09 ml       Labs: Basic Metabolic Panel: Recent Labs  Lab 08/19/19 0245 08/19/19 2351 08/21/19 0224  NA 140 134* 134*  K 4.6 4.4 4.6  CL 100 96* 99  CO2 20* 22  18*  GLUCOSE 96 86 130*  BUN 68* 34* 41*  CREATININE 8.59* 5.17* 5.90*  CALCIUM 8.2* 7.5* 7.0*   Liver Function Tests: Recent Labs  Lab 08/18/19 1120 08/19/19 0245  AST 29 51*  ALT 21 28  ALKPHOS 135* 131*  BILITOT 1.8* 1.1  PROT 7.8 7.2  ALBUMIN 2.9* 2.6*   Recent Labs  Lab 08/18/19 1120  LIPASE 26   No results for input(s): AMMONIA in the last 168 hours. CBC: Recent Labs  Lab 08/18/19 1120 08/18/19 1120 08/19/19 0245 08/19/19 0245 08/19/19 1114 08/20/19 0704 08/21/19 0224  WBC 14.1*   < > 20.6*   < > 16.7* 13.6* 13.4*  NEUTROABS 13.1*  --   --   --   --   --   --   HGB 11.1*   < > 11.2*   < > 10.9* 11.4* 10.8*  HCT 35.1*   < > 34.4*   < > 32.9* 35.0* 33.3*  MCV 102.3*  --  100.3*  --  99.1 100.9* 100.9*  PLT 109*   < > 113*   < > 122* 125* 129*   < > = values in this interval not displayed.   Cardiac Enzymes: No results for input(s): CKTOTAL, CKMB, CKMBINDEX, TROPONINI in the last 168 hours. CBG: No  results for input(s): GLUCAP in the last 168 hours.  Iron Studies: No results for input(s): IRON, TIBC, TRANSFERRIN, FERRITIN in the last 72 hours. Studies/Results: DG CHEST PORT 1 VIEW  Result Date: 08/20/2019 CLINICAL DATA:  Hypoxia, weakness, hypotension EXAM: PORTABLE CHEST 1 VIEW COMPARISON:  08/18/2019 FINDINGS: No significant interval change in low volume AP portable examination with cardiomegaly, left chest multi lead pacer defibrillator, and mild, diffuse interstitial pulmonary opacity, consistent with edema. No new airspace opacity. IMPRESSION: No significant interval change in low volume AP portable examination with cardiomegaly and mild, diffuse interstitial pulmonary opacity, consistent with edema. No new or focal airspace opacity. Electronically Signed   By: Eddie Candle M.D.   On: 08/20/2019 09:44   ECHOCARDIOGRAM COMPLETE  Result Date: 08/20/2019    ECHOCARDIOGRAM REPORT   Patient Name:   Clayton [name] Date of Exam: 08/20/2019 Medical Rec #:   [id number]        Height:       65.0 in Accession #:    [id number]       Weight:       179.2 lb Date of Birth:  09/24/1945         BSA:          1.888 m Patient Age:    25 years         BP:           81/53 mmHg Patient Gender: M                HR:           78 bpm. Exam Location:  Inpatient Procedure: 2D Echo Indications:    CAD  History:        Patient has prior history of Echocardiogram examinations, most                 recent 06/29/2014. CHF, CAD, Defibrillator; Risk                 Factors:Diabetes.  Sonographer:    Jannett Celestine RDCS (AE) Referring Phys: 41 VINEET SOOD  Sonographer Comments: No subcostal window and suboptimal parasternal window. off axis parasternal window IMPRESSIONS  1. very technically difficult study with poor windows, despite Definity contrast  2. Left ventricular ejection fraction, by estimation, is <20%. The left ventricle has severely decreased function. Global hypokinesis with areas of akinesis.  3. Right ventricular systolic function was not well visualized. The right ventricular size is not well visualized. A AICD wire is visualized.  4. The mitral valve was not well visualized. No evidence of mitral valve regurgitation.  5. The aortic valve was not well visualized. Aortic valve regurgitation is not visualized. Comparison(s): No significant change from prior study. 06/29/2014: LVEF <20%. FINDINGS  Left Ventricle: Trabeculated apex without obvious thrombus by Definity contrast. Left ventricular ejection fraction, by estimation, is <20%. The left ventricle has severely decreased function. The left ventricle demonstrates regional wall motion abnormalities. Definity contrast agent was given IV to delineate the left ventricular endocardial borders. The left ventricular internal cavity size was mildly dilated. There is no left ventricular hypertrophy. Abnormal (paradoxical) septal motion, consistent with RV pacemaker. Left ventricular diastolic parameters are indeterminate. Right Ventricle:  The right ventricular size is not well visualized. Right vetricular wall thickness was not assessed. Right ventricular systolic function was not well visualized. Left Atrium: Left atrial size was normal in size. Right Atrium: Right atrial size was normal in size. Pericardium: There is no evidence of pericardial effusion.  Mitral Valve: The mitral valve was not well visualized. No evidence of mitral valve regurgitation. Tricuspid Valve: The tricuspid valve is grossly normal. Tricuspid valve regurgitation is mild. Aortic Valve: The aortic valve was not well visualized. Aortic valve regurgitation is not visualized. Pulmonic Valve: The pulmonic valve was not well visualized. Pulmonic valve regurgitation is not visualized. Aorta: The aortic root was not well visualized. IAS/Shunts: The interatrial septum was not well visualized. Additional Comments: A AICD wire is visualized.  LEFT VENTRICLE PLAX 2D LVIDd:         5.60 cm LVIDs:         5.10 cm LV PW:         1.30 cm LV IVS:        0.70 cm LVOT diam:     2.00 cm LV SV:         35 LV SV Index:   19 LVOT Area:     3.14 cm  LEFT ATRIUM             Index LA diam:        4.70 cm 2.49 cm/m LA Vol (A2C):   59.3 ml 31.41 ml/m LA Vol (A4C):   47.7 ml 25.26 ml/m LA Biplane Vol: 57.6 ml 30.51 ml/m  AORTIC VALVE LVOT Vmax:   83.50 cm/s LVOT Vmean:  50.200 cm/s LVOT VTI:    0.112 m  AORTA Ao Root diam: 3.00 cm MITRAL VALVE               TRICUSPID VALVE MV Area (PHT): 4.15 cm    TR Peak grad:   28.9 mmHg MV Decel Time: 183 msec    TR Vmax:        269.00 cm/s MV E velocity: 74.20 cm/s                            SHUNTS                            Systemic VTI:  0.11 m                            Systemic Diam: 2.00 cm Lyman Bishop MD Electronically signed by Lyman Bishop MD Signature Date/Time: 08/20/2019/2:56:03 PM    Final     Medications: Infusions: . sodium chloride Stopped (08/20/19 0848)  . sodium chloride 10 mL/hr at 08/19/19 1900  . sodium chloride    . sodium  chloride    . ampicillin-sulbactam (UNASYN) IV Stopped (08/20/19 2329)  . norepinephrine (LEVOPHED) Adult infusion 9 mcg/min (08/21/19 0759)    Scheduled Medications: . Chlorhexidine Gluconate Cloth  6 each Topical Daily  . cinacalcet  60 mg Oral Daily  . feeding supplement (ENSURE ENLIVE)  237 mL Oral BID BM  . gabapentin  100 mg Oral QHS  . heparin injection (subcutaneous)  5,000 Units Subcutaneous Q8H  . latanoprost  1 drop Both Eyes QHS  . mouth rinse  15 mL Mouth Rinse BID  . midodrine  15 mg Oral TID WC  . multivitamin  1 tablet Oral QHS  . pantoprazole (PROTONIX) IV  40 mg Intravenous Q24H    have reviewed scheduled and prn medications.  Physical Exam: General:NAD, comfortable, able to lie on bed comfortable. Heart:RRR, s1s2 nl Lungs:clear b/l, no crackle Abdomen:soft, Non-tender, non-distended Extremities: Bilateral BKA Dialysis Access: Left upper extremity AV  fistula has good thrill and bruit  Raliyah Montella Tanna Furry 08/21/2019,8:02 AM  LOS: 3 days  Pager: [phone]

## 2019-08-22 DIAGNOSIS — Z515 Encounter for palliative care: Secondary | ICD-10-CM | POA: Diagnosis not present

## 2019-08-22 DIAGNOSIS — Z66 Do not resuscitate: Secondary | ICD-10-CM

## 2019-08-22 DIAGNOSIS — R4589 Other symptoms and signs involving emotional state: Secondary | ICD-10-CM

## 2019-08-22 DIAGNOSIS — J9601 Acute respiratory failure with hypoxia: Secondary | ICD-10-CM

## 2019-08-22 DIAGNOSIS — Z7189 Other specified counseling: Secondary | ICD-10-CM | POA: Diagnosis not present

## 2019-08-22 LAB — RENAL FUNCTION PANEL
Albumin: 2.3 g/dL — ABNORMAL LOW (ref 3.5–5.0)
Anion gap: 17 — ABNORMAL HIGH (ref 5–15)
BUN: 48 mg/dL — ABNORMAL HIGH (ref 8–23)
CO2: 21 mmol/L — ABNORMAL LOW (ref 22–32)
Calcium: 7.2 mg/dL — ABNORMAL LOW (ref 8.9–10.3)
Chloride: 99 mmol/L (ref 98–111)
Creatinine, Ser: 6.89 mg/dL — ABNORMAL HIGH (ref 0.61–1.24)
GFR calc Af Amer: 8 mL/min — ABNORMAL LOW (ref >60)
GFR calc non Af Amer: 7 mL/min — ABNORMAL LOW (ref >60)
Glucose, Bld: 82 mg/dL (ref 70–99)
Phosphorus: 4.2 mg/dL (ref 2.5–4.6)
Potassium: 3.5 mmol/L (ref 3.5–5.1)
Sodium: 137 mmol/L (ref 135–145)

## 2019-08-22 LAB — CBC
HCT: 31.8 % — ABNORMAL LOW (ref 39.0–52.0)
Hemoglobin: 10.2 g/dL — ABNORMAL LOW (ref 13.0–17.0)
MCH: 32 pg (ref 26.0–34.0)
MCHC: 32.1 g/dL (ref 30.0–36.0)
MCV: 99.7 fL (ref 80.0–100.0)
Platelets: 142 K/uL — ABNORMAL LOW (ref 150–400)
RBC: 3.19 MIL/uL — ABNORMAL LOW (ref 4.22–5.81)
RDW: 15.6 % — ABNORMAL HIGH (ref 11.5–15.5)
WBC: 9.4 K/uL (ref 4.0–10.5)
nRBC: 0 % (ref 0.0–0.2)

## 2019-08-22 LAB — HEPATITIS B SURFACE ANTIGEN: Hepatitis B Surface Ag: NONREACTIVE

## 2019-08-22 MED ORDER — PANTOPRAZOLE SODIUM 40 MG PO TBEC
40.0000 mg | DELAYED_RELEASE_TABLET | Freq: Every day | ORAL | Status: DC
  Administered 2019-08-22 – 2019-08-25 (×4): 40 mg via ORAL
  Filled 2019-08-22 (×4): qty 1

## 2019-08-22 MED ORDER — SODIUM CHLORIDE 0.9 % IV SOLN: 100.0000 mL | INTRAVENOUS | Status: DC | PRN

## 2019-08-22 MED ORDER — HEPARIN SODIUM (PORCINE) 1000 UNIT/ML DIALYSIS
1000.0000 [IU] | INTRAMUSCULAR | Status: DC | PRN
  Filled 2019-08-22: qty 1

## 2019-08-22 MED ORDER — ALTEPLASE 2 MG IJ SOLR: 2.0000 mg | Freq: Once | INTRAMUSCULAR | Status: DC | PRN

## 2019-08-22 MED ORDER — LIDOCAINE HCL (PF) 1 % IJ SOLN: 5.0000 mL | INTRAMUSCULAR | Status: DC | PRN

## 2019-08-22 MED ORDER — LIDOCAINE-PRILOCAINE 2.5-2.5 % EX CREA: 1.0000 "application " | TOPICAL_CREAM | CUTANEOUS | Status: DC | PRN

## 2019-08-22 MED ORDER — PENTAFLUOROPROP-TETRAFLUOROETH EX AERO: 1.0000 "application " | INHALATION_SPRAY | CUTANEOUS | Status: DC | PRN

## 2019-08-22 NOTE — Consult Note (Signed)
Consultation Note Date: 08/22/2019   Patient Name: AVETT REINECK  DOB: 02-17-1946  MRN: [id number]  Age / Sex: 74 y.o., male  PCP: [name], PA-C (Inactive) Referring Physician: Spero Geralds, MD  Reason for Consultation: Establishing goals of care  HPI/Patient Profile:  74 yo male [place] (followed at [facility]) brought to ER feeling weak.  Reports have nausea/vomiting/diarrhea for two weeks prior to admission.  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  Hx of chronic hypotension on midodrine (baseline SBP in 80's), and ESRD.  Missed his dialysis regimen on 08/15/19 due to feeling weak.  Palliative care was consulted to aid in goals of care conversations.  Clinical Assessment and Goals of Care: I have reviewed medical records including EPIC notes, labs and imaging, received report from bedside RN, assessed the patient.    I met with Satira Anis. Sylvestre to further discuss diagnosis prognosis, GOC, EOL wishes, disposition and options.   I introduced Palliative Medicine as specialized medical care for people living with serious illness. It focuses on providing relief from the symptoms and stress of a serious illness. The goal is to improve quality of life for both the patient and the family.  I asked Mikai to tell me a little about his life. Kysean shares that he is from [place] originally. He was in [employer] for thirty years. 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 He has a wife of thirty four years who he has been separated from for three years. He has four children though his most involved child is his daughter, Kenney Houseman. Patient is of the [employer] and has a close relationship with his pastor. He enjoys Engineer, structural.   Jose lives by himself in an apartment. He has an aid through the [place] who comes five days a week for three hours a day.  He was able to perform all bADLs prior to admission.   I asked Millan to tell me how his health state had been going. He shared that he was "sent here to die." I shared that he is in a poor position right now as he is on medications to help support his blood pressure. He shared that he has been here many times. He feels that he will improve to the point whereby he will be able to t  A detailed discussion was had today regarding advanced directives per Gwyndolyn Saxon he does not have any filed and he does not want his wife to make decisions on his behalf. He would like his daughter, Kenney Houseman or brother, Marc Morgans to make decisions for him.   Benz shares that a few weeks ago he lost his brother, Mallie Mussel who he considered to be his "rock". Provided therapeutic listening as a form  Of emotional support.   Concepts specific to code status, artifical feeding and hydration, continued IV antibiotics and rehospitalization was had. Chidubem shares that he was told twelve years ago that he was going to die. He then started hemodialysis and  has lived against all odds. I discussed with him whether or not cardiopulmonary resuscitation or intubation would be aligned with his wishes. He stated that he would not want CPR or intubation. We discussed him being made DNR status which he said aligns with his self wishes.  Discussed the importance of continued conversation with family and their  medical providers regarding overall plan of care and treatment options, ensuring decisions are within the context of the patients values and GOCs.  Decisions Maker: [name] (daughter) [phone]  SUMMARY OF RECOMMENDATIONS   DNAR/DNI  Ongoing [place] conversations  Chaplain consult for advance directive, patient would like to appoint his daughter Kenney Houseman to make decisions  Code Status/Advance Care Planning:  DNR  Symptom Management:  Spiritual:  - Chaplain consult Emotional Support  - Therapeutic listening [facility]  Problems: Septic shock Progression of Severe HFrEF vs cardiogenic shock. Echo showing EF<20% Acute hypoxic respiratory failure 2/2 pulmonary edema. ESRD on HD Macrocytic anemia Thrombocytopenia Neuropathy  Palliative Prophylaxis:   Aspiration, Bowel Regimen, Delirium Protocol, Eye Care, Frequent Pain Assessment, Oral Care, Palliative Wound Care and Turn Reposition  Additional Recommendations (Limitations, Scope, Preferences):  Full Scope Treatment  Psycho-social/Spiritual:   Desire for further Chaplaincy support:yes  Additional Recommendations: Caregiving  Support/Resources  Prognosis:   < 3 months  Discharge Planning: To Be Determined     Primary Diagnoses: Present on Admission: . Diarrhea I have reviewed the medical record, interviewed the patient and family, and examined the patient. The following aspects are pertinent.  Past Medical History:  Diagnosis Date  . CHF (congestive heart failure) ([place])   . Coronary artery disease   . Diabetes mellitus without complication ([place])   . Renal disorder   . S/P bilateral BKA (below knee amputation) ([place])    Social History   Socioeconomic History  . Marital status: Single    Spouse name: Not on file  . Number of children: Not on file  . Years of education: Not on file  . Highest education level: Not on file  Occupational History  . Not on file  Tobacco Use  . Smoking status: Never Smoker  . Smokeless tobacco: Never Used  Substance and Sexual Activity  . Alcohol use: No  . Drug use: No  . Sexual activity: Not on file  Other Topics Concern  . Not on file  Social History Narrative  . Not on file   Social Determinants of Health   Financial Resource Strain:   . Difficulty of Paying Living Expenses:   Food Insecurity:   . Worried About Charity fundraiser in the Last Year:   . Arboriculturist in the Last Year:   Transportation Needs:   . Film/video editor (Medical):   Marland Kitchen Lack of Transportation  (Non-Medical):   Physical Activity:   . Days of Exercise per Week:   . Minutes of Exercise per Session:   Stress:   . Feeling of Stress :   Social Connections:   . Frequency of Communication with Friends and Family:   . Frequency of Social Gatherings with Friends and Family:   . Attends Religious Services:   . Active Member of Clubs or Organizations:   . Attends Archivist Meetings:   Marland Kitchen Marital Status:    No family history on file. Scheduled Meds: . Chlorhexidine Gluconate Cloth  6 each Topical Daily  . Chlorhexidine Gluconate Cloth  6 each Topical Q0600  . cinacalcet  60 mg Oral Daily  .  feeding supplement (ENSURE ENLIVE)  237 mL Oral BID BM  . gabapentin  100 mg Oral QHS  . heparin injection (subcutaneous)  5,000 Units Subcutaneous Q8H  . latanoprost  1 drop Both Eyes QHS  . mouth rinse  15 mL Mouth Rinse BID  . midodrine  20 mg Oral TID WC  . multivitamin  1 tablet Oral QHS  . pantoprazole  40 mg Oral Daily   Continuous Infusions: . sodium chloride 10 mL/hr at 08/22/19 0900  . sodium chloride 10 mL/hr at 08/22/19 0900  . sodium chloride    . sodium chloride    . ampicillin-sulbactam (UNASYN) IV 3 g (08/22/19 0917)  . norepinephrine (LEVOPHED) Adult infusion 8 mcg/min (08/22/19 0900)   PRN Meds:.sodium chloride, sodium chloride, acetaminophen, alteplase, heparin, lidocaine (PF), lidocaine-prilocaine, ondansetron (ZOFRAN) IV, pentafluoroprop-tetrafluoroeth Medications Prior to Admission:  Prior to Admission medications   Medication Sig Start Date End Date Taking? Authorizing Provider  acetaminophen (TYLENOL) 325 MG tablet Take 650 mg by mouth every 4 (four) hours as needed for mild pain or fever.   Yes [provider]  b complex-vitamin c-folic acid (NEPHRO-VITE) 0.8 MG TABS tablet Take 1 tablet by mouth daily. 07/09/19  Yes [provider]  cinacalcet (SENSIPAR) 60 MG tablet Take 60 mg by mouth daily.  09/12/18  Yes [provider]    feeding supplement, RESOURCE BREEZE, (RESOURCE BREEZE) LIQD Take 1 Container by mouth 3 (three) times daily between meals. 07/10/14  Yes Ghimire, Henreitta Leber, MD  gabapentin (NEURONTIN) 100 MG capsule Take 100 mg by mouth at bedtime.   Yes [provider]  latanoprost (XALATAN) 0.005 % ophthalmic solution Apply 1 drop to eye at bedtime. 10/23/14  Yes [provider]  Melatonin 3 MG TABS Take 3 mg by mouth at bedtime as needed (sleep).   Yes [provider]  midodrine (PROAMATINE) 5 MG tablet Take 15 mg by mouth 3 (three) times daily with meals.   Yes [provider]  Multiple Vitamins tablet Take 1 tablet by mouth daily. 01/23/18  Yes [provider]  pravastatin (PRAVACHOL) 10 MG tablet Take 5 mg by mouth at bedtime.   Yes [provider]   Allergies  Allergen Reactions  . Simvastatin Other (See Comments)    unknown   Review of Systems  Neurological: Positive for weakness.  All other systems reviewed and are negative.  Physical Exam Vitals and nursing note reviewed.  HENT:     Head: Normocephalic.     Nose: Nose normal.     Mouth/Throat:     Mouth: Mucous membranes are dry.  Eyes:     Pupils: Pupils are equal, round, and reactive to light.  Cardiovascular:     Rate and Rhythm: Normal rate and regular rhythm.  Pulmonary:     Effort: Pulmonary effort is normal.     Comments: 2L Abdominal:     Palpations: Abdomen is soft.  Musculoskeletal:     Cervical back: Normal range of motion.  Skin:    General: Skin is warm and dry.     Capillary Refill: Capillary refill takes less than 2 seconds.  Neurological:     Mental Status: He is alert and oriented to person, place, and time.  Psychiatric:        Mood and Affect: Mood normal.   Vital Signs: BP (!) 79/65   Pulse 80   Temp 98.2 F (36.8 C) (Oral)   Resp (!) 22   Ht 6' 5"  (1.956  m)   Wt 87.6 kg   SpO2 99%   BMI 22.90 kg/m  Pain Scale: 0-10 POSS *See Group Information*:  S-Acceptable,Sleep, easy to arouse Pain Score: 0-No pain  SpO2: SpO2: 99 % O2 Device:SpO2: 99 % O2 Flow Rate: .O2 Flow Rate (L/min): 2 L/min IO: Intake/output summary:   Intake/Output Summary (Last 24 hours) at 08/22/2019 1232 Last data filed at 08/22/2019 1130 Gross per 24 hour  Intake 687.06 ml  Output -8 ml  Net 695.06 ml   LBM: Last BM Date: 08/19/19 Baseline Weight: Weight: 72.6 kg Most recent weight: Weight: 87.6 kg     Palliative Assessment/Data: 40%  Time In: 1230 Time Out: 1315 Time Total: 75 Greater than 50%  of this time was spent counseling and coordinating care related to the above assessment and plan.  Signed by: Rosezella Rumpf, NP   Please contact Palliative Medicine Team phone at [phone] for questions and concerns.  For individual provider: See Shea Evans

## 2019-08-22 NOTE — Progress Notes (Signed)
At pt. Bedside sbp remains in 60's with levophed infusing as documented via primary nurse. Pt. arousal with voice answering question appropriately. Dr. Carolin Sicks called and made aware of events prior to HD initiation. MD order to hold initiation until arrive at bedside to assess pt. Awaiting MD arrival.

## 2019-08-22 NOTE — Progress Notes (Addendum)
[facility] NEPHROLOGY PROGRESS NOTE  Assessment/ Plan: Pt is a 74 y.o. yo male with a chronic hypertension, ESRD on HD at [facility], had recent Covid vaccine, admitted with weakness, diarrhea and hypotension.  He received IV fluid in the ER.  #Hypotension: Patient reported that his blood pressure usually around 70s before dialysis at OP.  He is on midodrine.   Blood pressure remains low however asymptomatic.  He probably has low blood pressure because of systolic heart failure.  Volume status acceptable.  We will attempt intermittent hemodialysis today with no UF and with the support of Levophed.  Continue midodrine 20 mg 3 times daily.  Have discussed with the patient that he has multiorgan failure and may not tolerate dialysis well.  We cannot use Levophed during each dialysis.  Noted palliative care was consulted.  We will continue to follow.  Discussed with ICU team.  #Chronic systolic CHF: EF less than 20%.  This is probably the contributing factor for hypotension.  #ESRD on HD at [facility], MWF: Last HD on 4/6, tolerated well with the help of Levophed.  Attempting HD today as above to see if he can tolerate.  He has AV fistula for the access.    # Anemia of CKD: Hemoglobin at goal.  # Secondary hyperparathyroidism phosphorus level at goal.  #Leukocytosis: Cultures are negative.  On Unasyn empirically.  Subjective: Seen and examined in ICU.  Blood pressure remains low however remains asymptomatic.  Denies nausea vomiting chest pain shortness of breath.  No headache or dizziness.  Discussed with dialysis nurse and ICU team.    Objective Vital signs in last 24 hours: Vitals:   08/22/19 0600 08/22/19 0700 08/22/19 0718 08/22/19 0733  BP: (!) 78/64  (!) 64/54 (!) 69/52  Pulse: 77 77 78 79  Resp: (!) 21 17 (!) 24 19  Temp:    97.8 F (36.6 C)  TempSrc:    Oral  SpO2: 98% 100% 100% 100%  Weight:      Height:       Weight change: 2.2 kg  Intake/Output  Summary (Last 24 hours) at 08/22/2019 0810 Last data filed at 08/22/2019 0100 Gross per 24 hour  Intake 777.57 ml  Output --  Net 777.57 ml       Labs: Basic Metabolic Panel: Recent Labs  Lab 08/19/19 2351 08/21/19 0224 08/22/19 0311  NA 134* 134* 137  K 4.4 4.6 3.5  CL 96* 99 99  CO2 22 18* 21*  GLUCOSE 86 130* 82  BUN 34* 41* 48*  CREATININE 5.17* 5.90* 6.89*  CALCIUM 7.5* 7.0* 7.2*  PHOS  --   --  4.2   Liver Function Tests: Recent Labs  Lab 08/18/19 1120 08/19/19 0245 08/22/19 0311  AST 29 51*  --   ALT 21 28  --   ALKPHOS 135* 131*  --   BILITOT 1.8* 1.1  --   PROT 7.8 7.2  --   ALBUMIN 2.9* 2.6* 2.3*   Recent Labs  Lab 08/18/19 1120  LIPASE 26   No results for input(s): AMMONIA in the last 168 hours. CBC: Recent Labs  Lab 08/18/19 1120 08/18/19 1120 08/19/19 0245 08/19/19 0245 08/19/19 1114 08/20/19 0704 08/21/19 0224  WBC 14.1*   < > 20.6*   < > 16.7* 13.6* 13.4*  NEUTROABS 13.1*  --   --   --   --   --   --   HGB 11.1*   < > 11.2*   < >  10.9* 11.4* 10.8*  HCT 35.1*   < > 34.4*   < > 32.9* 35.0* 33.3*  MCV 102.3*  --  100.3*  --  99.1 100.9* 100.9*  PLT 109*   < > 113*   < > 122* 125* 129*   < > = values in this interval not displayed.   Cardiac Enzymes: No results for input(s): CKTOTAL, CKMB, CKMBINDEX, TROPONINI in the last 168 hours. CBG: No results for input(s): GLUCAP in the last 168 hours.  Iron Studies: No results for input(s): IRON, TIBC, TRANSFERRIN, FERRITIN in the last 72 hours. Studies/Results: DG CHEST PORT 1 VIEW  Result Date: 08/20/2019 CLINICAL DATA:  Hypoxia, weakness, hypotension EXAM: PORTABLE CHEST 1 VIEW COMPARISON:  08/18/2019 FINDINGS: No significant interval change in low volume AP portable examination with cardiomegaly, left chest multi lead pacer defibrillator, and mild, diffuse interstitial pulmonary opacity, consistent with edema. No new airspace opacity. IMPRESSION: No significant interval change in low volume AP  portable examination with cardiomegaly and mild, diffuse interstitial pulmonary opacity, consistent with edema. No new or focal airspace opacity. Electronically Signed   By: Eddie Candle M.D.   On: 08/20/2019 09:44   ECHOCARDIOGRAM COMPLETE  Result Date: 08/20/2019    ECHOCARDIOGRAM REPORT   Patient Name:   ETIENNE MOWERS Date of Exam: 08/20/2019 Medical Rec #:  [id number]        Height:       65.0 in Accession #:    [id number]       Weight:       179.2 lb Date of Birth:  Aug 23, 1945         BSA:          1.888 m Patient Age:    63 years         BP:           81/53 mmHg Patient Gender: M                HR:           78 bpm. Exam Location:  Inpatient Procedure: 2D Echo Indications:    CAD  History:        Patient has prior history of Echocardiogram examinations, most                 recent 06/29/2014. CHF, CAD, Defibrillator; Risk                 Factors:Diabetes.  Sonographer:    Jannett Celestine RDCS (AE) Referring Phys: 35 VINEET SOOD  Sonographer Comments: No subcostal window and suboptimal parasternal window. off axis parasternal window IMPRESSIONS  1. very technically difficult study with poor windows, despite Definity contrast  2. Left ventricular ejection fraction, by estimation, is <20%. The left ventricle has severely decreased function. Global hypokinesis with areas of akinesis.  3. Right ventricular systolic function was not well visualized. The right ventricular size is not well visualized. A AICD wire is visualized.  4. The mitral valve was not well visualized. No evidence of mitral valve regurgitation.  5. The aortic valve was not well visualized. Aortic valve regurgitation is not visualized. Comparison(s): No significant change from prior study. 06/29/2014: LVEF <20%. FINDINGS  Left Ventricle: Trabeculated apex without obvious thrombus by Definity contrast. Left ventricular ejection fraction, by estimation, is <20%. The left ventricle has severely decreased function. The left ventricle demonstrates  regional wall motion abnormalities. Definity contrast agent was given IV to delineate the left ventricular endocardial borders. The left ventricular internal  cavity size was mildly dilated. There is no left ventricular hypertrophy. Abnormal (paradoxical) septal motion, consistent with RV pacemaker. Left ventricular diastolic parameters are indeterminate. Right Ventricle: The right ventricular size is not well visualized. Right vetricular wall thickness was not assessed. Right ventricular systolic function was not well visualized. Left Atrium: Left atrial size was normal in size. Right Atrium: Right atrial size was normal in size. Pericardium: There is no evidence of pericardial effusion. Mitral Valve: The mitral valve was not well visualized. No evidence of mitral valve regurgitation. Tricuspid Valve: The tricuspid valve is grossly normal. Tricuspid valve regurgitation is mild. Aortic Valve: The aortic valve was not well visualized. Aortic valve regurgitation is not visualized. Pulmonic Valve: The pulmonic valve was not well visualized. Pulmonic valve regurgitation is not visualized. Aorta: The aortic root was not well visualized. IAS/Shunts: The interatrial septum was not well visualized. Additional Comments: A AICD wire is visualized.  LEFT VENTRICLE PLAX 2D LVIDd:         5.60 cm LVIDs:         5.10 cm LV PW:         1.30 cm LV IVS:        0.70 cm LVOT diam:     2.00 cm LV SV:         35 LV SV Index:   19 LVOT Area:     3.14 cm  LEFT ATRIUM             Index LA diam:        4.70 cm 2.49 cm/m LA Vol (A2C):   59.3 ml 31.41 ml/m LA Vol (A4C):   47.7 ml 25.26 ml/m LA Biplane Vol: 57.6 ml 30.51 ml/m  AORTIC VALVE LVOT Vmax:   83.50 cm/s LVOT Vmean:  50.200 cm/s LVOT VTI:    0.112 m  AORTA Ao Root diam: 3.00 cm MITRAL VALVE               TRICUSPID VALVE MV Area (PHT): 4.15 cm    TR Peak grad:   28.9 mmHg MV Decel Time: 183 msec    TR Vmax:        269.00 cm/s MV E velocity: 74.20 cm/s                             SHUNTS                            Systemic VTI:  0.11 m                            Systemic Diam: 2.00 cm Lyman Bishop MD Electronically signed by Lyman Bishop MD Signature Date/Time: 08/20/2019/2:56:03 PM    Final     Medications: Infusions: . sodium chloride 10 mL/hr at 08/22/19 0526  . sodium chloride 10 mL/hr at 08/22/19 0100  . sodium chloride    . sodium chloride    . sodium chloride    . sodium chloride    . ampicillin-sulbactam (UNASYN) IV Stopped (08/21/19 2158)  . norepinephrine (LEVOPHED) Adult infusion Stopped (08/21/19 1619)    Scheduled Medications: . Chlorhexidine Gluconate Cloth  6 each Topical Daily  . Chlorhexidine Gluconate Cloth  6 each Topical Q0600  . cinacalcet  60 mg Oral Daily  . feeding supplement (ENSURE ENLIVE)  237 mL Oral BID BM  . gabapentin  100 mg Oral QHS  . heparin injection (subcutaneous)  5,000 Units Subcutaneous Q8H  . latanoprost  1 drop Both Eyes QHS  . mouth rinse  15 mL Mouth Rinse BID  . midodrine  20 mg Oral TID WC  . multivitamin  1 tablet Oral QHS  . pantoprazole (PROTONIX) IV  40 mg Intravenous Q24H    have reviewed scheduled and prn medications.  Physical Exam: General: Not in distress, lying on bed comfortable Heart:RRR, s1s2 nl Lungs:clear b/l, no crackle Abdomen:soft, Non-tender, non-distended Extremities: Bilateral BKA Dialysis Access: Left upper extremity AV fistula has good thrill and bruit Neurology: Alert, awake, following commands.  Lynford Espinoza Prasad Elvi Leventhal 08/22/2019,8:10 AM  LOS: 4 days  Pager: [phone]

## 2019-08-22 NOTE — Progress Notes (Signed)
PULMONARY / CRITICAL CARE MEDICINE   NAME:  Clayton [name], MRN:  [id number], DOB:  Aug 11, 1945, LOS: 4 ADMISSION DATE:  08/18/2019, CONSULTATION DATE:  08/18/2019 REFERRING MD:  Dr. Sherry Ruffing, ER, CHIEF COMPLAINT:  Diarrhea  BRIEF HISTORY:    Clayton [name] is a 74 yo male veteran who receives care at [place] and [place].  Hx of ESRD on HD M/W/F and chronic hypotension.  He wasn't able to get HD on 08/15/19 due to feeling weak.  Started having nausea, vomiting, and diarrhea about 2 weeks ago.  He received 2nd dose of [place] vaccine on 08/16/19.  He reports hx of chronic hypotension with baseline SBP in 80's.  His BP initially in ER was 53/38 and improved after getting 2.5 liters normal saline in ER.   SIGNIFICANT PAST MEDICAL HISTORY   CHF, ESRD, DM, CAD, s/p AICD, PAD s/p b/l BKA  SIGNIFICANT EVENTS:  4/05 Admit 4/7>>echo showing EF<20% 4/8>>palliative care consulted. Work on weaning levo  STUDIES:   4/5 CXR>>Low lung volumes with increased interstitial markings bilaterally with areas of juxta diaphragmatic airspace disease at the lung bases. Probable background pulmonary edema with small fissural fluid along the minor fissure in the right chest. Airspace disease at the lung bases likely atelectasis.  4/7 echocardiogram>>EF <20% with global hypokinesis and areas of akinesis of the LV  CULTURES:  SARS CoV2 4/05 >>NEG RVP 4/5>>negative C diff PCR 4/05 >>negative GI pathogen 4/05 >>NEG Blood 4/05 >> NGTD  ANTIBIOTICS:  Vancomycin 4/05>>4/6 Cefepime 4/05 >>4/07 Flagyl 4/05 >>4/07 Unasyn 4/07>>  LINES/TUBES:  n/a  CONSULTANTS:  Nephrology Palliative  SUBJECTIVE:  Discussed echo results with him.  CONSTITUTIONAL: BP (!) 69/52   Pulse 79   Temp 97.8 F (36.6 C) (Oral)   Resp 19   Ht 6\' 5"  (1.956 m)   Wt 87.3 kg   SpO2 100%   BMI 22.82 kg/m   I/O last 3 completed shifts: In: 1232.1 [P.O.:80; I.V.:851.9; IV Piggyback:300.2] Out: -     PHYSICAL  EXAM: General - alert HEENT: mild swelling at right posterior mandible. Poor dentition but no obvious abscess or drainage in oral cavity Cardiac - ICD pocket CDI. Otherwise RRR with soft systolic murmur.   Chest - on 3L [place]. Bibasilar crackles appreciated Abdomen - bs active Extremities - b/l BKA, AV fistula Lt arm  Skin - no rashes Neuro - normal strength, moves extremities, follows commands ASSESSMENT AND PLAN    Septic shock. Suspect GI source given presentation with N/V/D. WBC downtrending. Remains afebrile since admission.  NG on blood cultures. GI panel/C-diff negative -- continue unasyn--tx day #5/7. End date 4/11.   Progression of Severe HFrEF vs cardiogenic shock. Echo showing EF<20% --midodrine increased to 20mg  tid this admission.  --titrate levo to mental status. He may need some with HD today but while off norepinephrine today his SBP is measured int he 62mmHg range and he is awake, alert, oriented.  --strict I/O, daily weights, cardiac monitoring  --wean supplemental O2 as able --palliative care consulted to assist with goals of care in terminal stages of ESRD and HF.   Acute hypoxic respiratory failure 2/2 pulmonary edema. --Remains on 3L supplemental O2 --titrate down as tolerated goal sats 88%. Chest xray from 4/7 with worsening pulmonary edema, cardiomegaly.   ESRD on HD --nephrology following. Planning for HD today for clearance only. Unclear how much volume removal he'll be able to tolerate. --continue cinacalcet  Macrocytic anemia--stable Thrombocytopenia--stable from admission  Neuropathy. - continue neurontin  Best Practice / Goals of Care / Disposition.   DVT PROPHYLAXIS: SQ heparin SUP: Protonix NUTRITION:renal diet MOBILITY: OOB with assist GOALS OF CARE: full code FAMILY DISCUSSIONS: discussed with daughter at bedside 4/7 and with patient daily. Palliative medicine has also been consulted to assist.  DISPOSITION ICU. If able to sustain off  norepinephrine after dialysis, potential transfer to PCU.   LABS/IMAGING  Glucose No results for input(s): GLUCAP in the last 168 hours.  BMET Recent Labs  Lab 08/19/19 2351 08/21/19 0224 08/22/19 0311  NA 134* 134* 137  K 4.4 4.6 3.5  CL 96* 99 99  CO2 22 18* 21*  BUN 34* 41* 48*  CREATININE 5.17* 5.90* 6.89*  GLUCOSE 86 130* 82    Liver Enzymes Recent Labs  Lab 08/18/19 1120 08/19/19 0245 08/22/19 0311  AST 29 51*  --   ALT 21 28  --   ALKPHOS 135* 131*  --   BILITOT 1.8* 1.1  --   ALBUMIN 2.9* 2.6* 2.3*    Electrolytes Recent Labs  Lab 08/19/19 2351 08/21/19 0224 08/22/19 0311  CALCIUM 7.5* 7.0* 7.2*  MG 1.9  --   --   PHOS  --   --  4.2    CBC Recent Labs  Lab 08/19/19 1114 08/20/19 0704 08/21/19 0224  WBC 16.7* 13.6* 13.4*  HGB 10.9* 11.4* 10.8*  HCT 32.9* 35.0* 33.3*  PLT 122* 125* 129*    ABG No results for input(s): PHART, PCO2ART, PO2ART in the last 168 hours.  Coag's Recent Labs  Lab 08/18/19 1120  APTT 40*  INR 1.8*    Sepsis Markers Recent Labs  Lab 08/18/19 1120 08/18/19 1441 08/18/19 2343  LATICACIDVEN 5.1* 3.0* 1.8    The patient is critically ill with multiple organ systems failure and requires high complexity decision making for assessment and support, frequent evaluation and titration of therapies, application of advanced monitoring technologies and extensive interpretation of multiple databases.   Critical Care Time devoted to patient care services described in this note is 34 minutes. This time reflects time of care of this Clayton [name] . This critical care time does not reflect separately billable procedures or procedure time, teaching time or supervisory time of PA/NP/Med student/Med Resident etc but could involve care discussion time.  Clayton [name] Pulmonary and Critical Care Medicine 08/22/2019 8:22 AM  Pager: [phone] After hours pager: [phone]

## 2019-08-22 NOTE — Progress Notes (Signed)
Dr. Carolin Sicks at bedside discusses risk of continuing HD at this time with low bp and palliative care options also discussed. Pt. Verbally expresses wishes to still attempt HD tx today. Orders per Dr. Carolin Sicks to continue with initation of HD per pt. Request and to maintain sbp 65 or greater with levophed infusion.

## 2019-08-22 NOTE — Progress Notes (Signed)
Report received from primary [place], RN sbp in [phone]. Levophed not currently running. Request primary nurse to resume levophed per HD orders to keep sbp >70 and administer scheduled dose midodrine prior to HD initiation. Nurse states she will initiate

## 2019-08-23 DIAGNOSIS — Z515 Encounter for palliative care: Secondary | ICD-10-CM | POA: Diagnosis not present

## 2019-08-23 DIAGNOSIS — Z7189 Other specified counseling: Secondary | ICD-10-CM | POA: Diagnosis not present

## 2019-08-23 LAB — CULTURE, BLOOD (ROUTINE X 2)
Culture: NO GROWTH
Culture: NO GROWTH
Special Requests: ADEQUATE

## 2019-08-23 LAB — GLUCOSE, CAPILLARY: Glucose-Capillary: 105 mg/dL — ABNORMAL HIGH (ref 70–99)

## 2019-08-23 NOTE — Progress Notes (Addendum)
PULMONARY / CRITICAL CARE MEDICINE   NAME:  CAEDMON LOUQUE, MRN:  [id number], DOB:  01-17-1946, LOS: 5 ADMISSION DATE:  08/18/2019, CONSULTATION DATE:  08/18/2019 REFERRING MD:  Dr. Sherry Ruffing, ER, CHIEF COMPLAINT:  Diarrhea  BRIEF HISTORY:    Clayton [name] is a 74 yo male veteran who receives care at [place] and [place].  Hx of ESRD on HD M/W/F and chronic hypotension.  He wasn't able to get HD on 08/15/19 due to feeling weak.  Started having nausea, vomiting, and diarrhea about 2 weeks ago.  He received 2nd dose of [place] vaccine on 08/16/19.  He reports hx of chronic hypotension with baseline SBP in 80's.  His BP initially in ER was 53/38 and improved after getting 2.5 liters normal saline in ER.   SIGNIFICANT PAST MEDICAL HISTORY   CHF, ESRD, DM, CAD, s/p AICD, PAD s/p b/l BKA  SIGNIFICANT EVENTS:  4/05 Admit 4/7>>echo showing EF<20% 4/8>>palliative care consulted. Work on weaning levo 4/9>>levo weaned, seen by palliative>DNR 4/10>off pressor support  STUDIES:   4/5 CXR>>Low lung volumes with increased interstitial markings bilaterally with areas of juxta diaphragmatic airspace disease at the lung bases. Probable background pulmonary edema with small fissural fluid along the minor fissure in the right chest. Airspace disease at the lung bases likely atelectasis.  4/7 echocardiogram>>EF <20% with global hypokinesis and areas of akinesis of the LV  CULTURES:  SARS CoV2 4/05 >>NEG RVP 4/5>>negative C diff PCR 4/05 >>negative GI pathogen 4/05 >>NEG Blood 4/05 >> NG  ANTIBIOTICS:  Vancomycin 4/05>>4/6 Cefepime 4/05 >>4/07 Flagyl 4/05 >>4/07 Unasyn 4/07>>  LINES/TUBES:  n/a  CONSULTANTS:  Nephrology Palliative  SUBJECTIVE:  No overnight events  CONSTITUTIONAL: BP (!) 73/53   Pulse 82   Temp 98.1 F (36.7 C) (Oral)   Resp (!) 24   Ht 6\' 5"  (1.956 m)   Wt 84.5 kg   SpO2 97%   BMI 22.09 kg/m   I/O last 3 completed shifts: In: 1600.4 [P.O.:720;  I.V.:580.4; IV Piggyback:300] Out: -8     PHYSICAL EXAM: General - alert HEENT: mild swelling at right posterior mandible. Poor dentition but no obvious abscess or drainage in oral cavity Cardiac - ICD pocket CDI. Otherwise RRR with soft systolic murmur.   Chest - on 2L [place]. Bibasilar crackles appreciated Abdomen - bs active Extremities - b/l BKA, AV fistula Lt arm  Skin - no rashes Neuro - normal strength, moves extremities, follows commands ASSESSMENT AND PLAN    Septic shock. Suspect GI source given presentation with N/V/D. Leukocytosis resolved Remains afebrile since admission.  NG on blood cultures. GI panel/C-diff negative -- continue unasyn--tx day #6/7. End date 4/11. Could consider transitioning to Augmentin if ready for discharge later today  Progression of Severe HFrEF vs cardiogenic shock. Echo showing EF<20% Off pressor support since around 1400 4/9 Appreciate palliative's assistance>changed to DNR 4/9 --midodrine 20mg  --strict I/O, daily weights, cardiac monitoring  Acute hypoxic respiratory failure 2/2 pulmonary edema. --down to 2L supplemental O2 --will work on weaning off supplemental O2 in anticipation of discharge. May need to be discharged home with O2  ESRD on HD --nephrology following.  --continue cinacalcet  Macrocytic anemia--stable Thrombocytopenia--stable from admission  Neuropathy. - continue neurontin  Best Practice / Goals of Care / Disposition.   DVT PROPHYLAXIS: SQ heparin SUP: Protonix NUTRITION:renal diet MOBILITY: OOB with assist GOALS OF CARE: DNR FAMILY DISCUSSIONS: palliative following to continue [place] discussions with patient and family. DISPOSITION  Likely d/c home today.  LABS/IMAGING  Glucose No results for input(s): GLUCAP in the last 168 hours.  BMET Recent Labs  Lab 08/19/19 2351 08/21/19 0224 08/22/19 0311  NA 134* 134* 137  K 4.4 4.6 3.5  CL 96* 99 99  CO2 22 18* 21*  BUN 34* 41* 48*  CREATININE 5.17* 5.90*  6.89*  GLUCOSE 86 130* 82    Liver Enzymes Recent Labs  Lab 08/18/19 1120 08/19/19 0245 08/22/19 0311  AST 29 51*  --   ALT 21 28  --   ALKPHOS 135* 131*  --   BILITOT 1.8* 1.1  --   ALBUMIN 2.9* 2.6* 2.3*    Electrolytes Recent Labs  Lab 08/19/19 2351 08/21/19 0224 08/22/19 0311  CALCIUM 7.5* 7.0* 7.2*  MG 1.9  --   --   PHOS  --   --  4.2    CBC Recent Labs  Lab 08/20/19 0704 08/21/19 0224 08/22/19 0822  WBC 13.6* 13.4* 9.4  HGB 11.4* 10.8* 10.2*  HCT 35.0* 33.3* 31.8*  PLT 125* 129* 142*    ABG No results for input(s): PHART, PCO2ART, PO2ART in the last 168 hours.  Coag's Recent Labs  Lab 08/18/19 1120  APTT 40*  INR 1.8*    Sepsis Markers Recent Labs  Lab 08/18/19 1120 08/18/19 1441 08/18/19 2343  LATICACIDVEN 5.1* 3.0* 1.8    The patient is critically ill with multiple organ systems failure and requires high complexity decision making for assessment and support, frequent evaluation and titration of therapies, application of advanced monitoring technologies and extensive interpretation of multiple databases.   Critical Care Time devoted to patient care services described in this note is 34 minutes. This time reflects time of care of this [place] . This critical care time does not reflect separately billable procedures or procedure time, teaching time or supervisory time of PA/NP/Med student/Med Resident etc but could involve care discussion time.  Avarey Yaeger Micheline Chapman Pulmonary and Critical Care Medicine 08/23/2019 7:05 AM  Pager: [phone] After hours pager: [phone]

## 2019-08-23 NOTE — Progress Notes (Signed)
   Palliative Medicine Inpatient Follow Up Note   HPI: 74 yo male [place] (followed at [place] and [place]) brought to ER feeling weak. Reports have nausea/vomiting/diarrhea for two weeks prior to admission. 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 Hx of chronic hypotension on midodrine (baseline SBP in 80's), and ESRD. Missed his dialysis regimen on 08/15/19 due to feeling weak.  Palliative care was consulted to aid in goals of care conversations.  Today's Discussion (08/23/2019): Chart reviewed. I met with Clayton [name] at bedside, he now off pressors. He endorses feeling some right tooth pain which is not new and he plans to see a dentist for,  otherwise is well. He stated that he is ready to leave. I asked him if I could offer him any additional support which he declined.  Discussed with patient the importance of continued conversation with family and their  medical providers regarding overall plan of care and treatment options, ensuring decisions are within the context of the patients values and GOCs.  Questions and concerns addressed   Vital Signs Vitals:   08/23/19 1000 08/23/19 1100  BP: (!) 72/46   Pulse: 81 77  Resp: (!) 26 (!) 25  Temp:    SpO2: 90% (!) 82%    Intake/Output Summary (Last 24 hours) at 08/23/2019 1125 Last data filed at 08/23/2019 1100 Gross per 24 hour  Intake 1375.57 ml  Output -8 ml  Net 1383.57 ml   Last Weight  Most recent update: 08/23/2019  5:04 AM   Weight  84.5 kg (186 lb 4.6 oz)           Physical Exam Vitals and nursing note reviewed.  HENT:     Head: Normocephalic.     Nose: Nose normal.     Mouth/Throat:     Mouth: Mucous membranes are dry.  Eyes:     Pupils: Pupils are equal, round, and reactive to light.  Cardiovascular:     Rate and Rhythm: Normal rate and regular rhythm.  Pulmonary:     Effort: Pulmonary effort is normal.     Comments: 2L Abdominal:     Palpations: Abdomen is soft.  Musculoskeletal:   Cervical back: Normal range of motion.  Skin:    General: Skin is warm and dry.     Capillary Refill: Capillary refill takes less than 2 seconds.  Neurological:     Mental Status: He is alert and oriented to person, place, and time.  Psychiatric:        Mood and Affect: Mood normal.   SUMMARY OF RECOMMENDATIONS   DNAR/DNI  TOC --> OP Palliative care referral  Chaplain   Time Spent: 15 Greater than 50% of the time was spent in counseling and coordination of care ______________________________________________________________________________________ [place] Team Team Cell Phone: [phone] Please utilize secure chat with additional questions, if there is no response within 30 minutes please call the above phone number  Palliative Medicine Team providers are available by phone from 7am to 7pm daily and can be reached through the team cell phone.  Should this patient require assistance outside of these hours, please call the patient's attending physician.

## 2019-08-23 NOTE — Progress Notes (Addendum)
[facility] NEPHROLOGY PROGRESS NOTE  Assessment/ Plan: Pt is a 74 y.o. yo male with a chronic hypertension, ESRD on HD at [facility], had recent Covid vaccine, admitted with weakness, diarrhea and hypotension.  He received IV fluid in the ER.  #Hypotension: Patient reported that his blood pressure usually around 70s before dialysis at OP.    Blood pressure remains low however asymptomatic.  He probably has low blood pressure because of low EF  Volume status acceptable.  Status post HD yesterday with no UF.  Tolerated well except low blood pressure. Continue midodrine 20 mg 3 times daily. Since patient is on hemodialysis for many years and chronically low BP, I think it is safe for him to go to his own HD unit to continue dialysis.  I have discussed with ICU team.  Given in the stage CHF, ESRD and multiple comorbidities the overall prognosis looks poor.   #Chronic systolic CHF: EF less than 20%.  This is probably the contributing factor for hypotension.  #ESRD on HD at [facility], MWF: Had to intermittent hemodialysis and tolerated well except chronic hypotension.  Remains asymptomatic.  He has AV fistula for the access.    # Anemia of CKD: Hemoglobin at goal.  # Secondary hyperparathyroidism phosphorus level at goal.  Discontinue Sensipar because of hypocalcemia.  #Leukocytosis: Cultures are negative.  On Unasyn empirically.  WBC count improved.  Subjective: Seen and examined in ICU.  No new event.  Denies nausea vomiting chest pain shortness of breath.  No headache or dizziness. Objective Vital signs in last 24 hours: Vitals:   08/23/19 0400 08/23/19 0500 08/23/19 0600 08/23/19 0700  BP: (!) 74/56 (!) 56/46 (!) 68/53 (!) 73/53  Pulse: 75 81 79 82  Resp: 15 (!) 23 20 (!) 24  Temp: 98.1 F (36.7 C)     TempSrc: Oral     SpO2: 98% 90% 99% 97%  Weight:  84.5 kg    Height:       Weight change: 0.3 kg  Intake/Output Summary (Last 24 hours) at 08/23/2019  0903 Last data filed at 08/23/2019 0000 Gross per 24 hour  Intake 948.16 ml  Output -8 ml  Net 956.16 ml       Labs: Basic Metabolic Panel: Recent Labs  Lab 08/19/19 2351 08/21/19 0224 08/22/19 0311  NA 134* 134* 137  K 4.4 4.6 3.5  CL 96* 99 99  CO2 22 18* 21*  GLUCOSE 86 130* 82  BUN 34* 41* 48*  CREATININE 5.17* 5.90* 6.89*  CALCIUM 7.5* 7.0* 7.2*  PHOS  --   --  4.2   Liver Function Tests: Recent Labs  Lab 08/18/19 1120 08/19/19 0245 08/22/19 0311  AST 29 51*  --   ALT 21 28  --   ALKPHOS 135* 131*  --   BILITOT 1.8* 1.1  --   PROT 7.8 7.2  --   ALBUMIN 2.9* 2.6* 2.3*   Recent Labs  Lab 08/18/19 1120  LIPASE 26   No results for input(s): AMMONIA in the last 168 hours. CBC: Recent Labs  Lab 08/18/19 1120 08/18/19 1120 08/19/19 0245 08/19/19 0245 08/19/19 1114 08/19/19 1114 08/20/19 0704 08/21/19 0224 08/22/19 0822  WBC 14.1*   < > 20.6*   < > 16.7*   < > 13.6* 13.4* 9.4  NEUTROABS 13.1*  --   --   --   --   --   --   --   --   HGB 11.1*   < >  11.2*   < > 10.9*   < > 11.4* 10.8* 10.2*  HCT 35.1*   < > 34.4*   < > 32.9*   < > 35.0* 33.3* 31.8*  MCV 102.3*   < > 100.3*  --  99.1  --  100.9* 100.9* 99.7  PLT 109*   < > 113*   < > 122*   < > 125* 129* 142*   < > = values in this interval not displayed.   Cardiac Enzymes: No results for input(s): CKTOTAL, CKMB, CKMBINDEX, TROPONINI in the last 168 hours. CBG: No results for input(s): GLUCAP in the last 168 hours.  Iron Studies: No results for input(s): IRON, TIBC, TRANSFERRIN, FERRITIN in the last 72 hours. Studies/Results: No results found.  Medications: Infusions: . sodium chloride 10 mL/hr at 08/23/19 0817  . sodium chloride Stopped (08/22/19 1049)  . sodium chloride    . sodium chloride    . ampicillin-sulbactam (UNASYN) IV 3 g (08/23/19 0818)    Scheduled Medications: . Chlorhexidine Gluconate Cloth  6 each Topical Daily  . Chlorhexidine Gluconate Cloth  6 each Topical Q0600  .  cinacalcet  60 mg Oral Daily  . feeding supplement (ENSURE ENLIVE)  237 mL Oral BID BM  . gabapentin  100 mg Oral QHS  . heparin injection (subcutaneous)  5,000 Units Subcutaneous Q8H  . latanoprost  1 drop Both Eyes QHS  . mouth rinse  15 mL Mouth Rinse BID  . midodrine  20 mg Oral TID WC  . multivitamin  1 tablet Oral QHS  . pantoprazole  40 mg Oral Daily    have reviewed scheduled and prn medications.  Physical Exam: General: Sitting on bed comfortable, not in distress. Heart:RRR, s1s2 nl Lungs:clear b/l, no crackle Abdomen:soft, Non-tender, non-distended Extremities: Bilateral BKA Dialysis Access: Left upper extremity AV fistula has good thrill and bruit Neurology: Alert, awake, following commands.  Johnica Armwood Tanna Furry 08/23/2019,9:03 AM  LOS: 5 days  Pager: [phone]

## 2019-08-23 NOTE — Progress Notes (Signed)
Dr. Tamala Julian requested that the transitions of care team member be contacted regarding sending Clayton [name] home with O2. Nolen Mu Paged at 0930, awaiting callback.

## 2019-08-24 DIAGNOSIS — Z515 Encounter for palliative care: Secondary | ICD-10-CM | POA: Diagnosis not present

## 2019-08-24 DIAGNOSIS — Z7189 Other specified counseling: Secondary | ICD-10-CM | POA: Diagnosis not present

## 2019-08-24 MED ORDER — CHLORHEXIDINE GLUCONATE CLOTH 2 % EX PADS
6.0000 | MEDICATED_PAD | Freq: Every day | CUTANEOUS | Status: DC
  Administered 2019-08-25: 6 via TOPICAL

## 2019-08-24 MED ORDER — MIDODRINE HCL 10 MG PO TABS: 20.0000 mg | ORAL_TABLET | Freq: Three times a day (TID) | ORAL | 0 refills | Status: AC

## 2019-08-24 NOTE — Progress Notes (Signed)
[place] Progress Note Patient Name: Clayton [name] DOB: 1945-08-17 MRN: [id number]   Date of Service  08/24/2019  HPI/Events of Note  Discharge placed on hold as family was unable to arrange for transportation  eICU Interventions  Patient is for HD in am and will hopefully be able to be discharged after     Intervention Category Minor Interventions: Other:  Clayton [name] 08/24/2019, 9:49 PM

## 2019-08-24 NOTE — Progress Notes (Signed)
Pt daughter could not make arrangements to get pt home and arrange nurse aid for dialysis in [facility] in the am.  Calling to let physican know

## 2019-08-24 NOTE — Progress Notes (Signed)
[facility])  Referral received for outpatient palliative care services in the community.    Pt is discharging today. [facility] will f/u with him once he is at home and update his primary MD that we will be consulting to help with [place] conversations.  Venia Carbon RN, BSN, [facility] Liaison

## 2019-08-24 NOTE — TOC Transition Note (Addendum)
Transition of Care [facility]) - CM/SW Discharge Note   Patient Details  Name: GADIEL JOHN MRN: [id number] Date of Birth: August 04, 1945  Transition of Care [facility]) CM/SW Contact:  Claudie Leach, RN 08/24/2019, 11:48 AM   Clinical Narrative:    DME O2 will be delivered to room prior to discharge.  Update 1520: Outpatient Palliative Referral called to Venia Carbon with Manufacturing engineer.   Final next level of care: Home/Self Care Barriers to Discharge: Barriers Resolved   Discharge Plan and Services                  DME Agency: [facility]) Date DME Agency Contacted: 08/24/19 Time DME Agency Contacted: [phone] Representative spoke with at DME Agency: Brenton Grills

## 2019-08-24 NOTE — Progress Notes (Signed)
Pt bilater lower extremity amputee.  Lives home alone but has wheelchair and other supplies at home with family support nearby.  Oxygen levels have been between 86-88 in bed with active ROM excerises on room air after 1 Minute.  Nasal canula 2L applied and oxygen level returned to 93%. RR decreased to 18 without O2 20-22.

## 2019-08-24 NOTE — Progress Notes (Signed)
   Palliative Medicine Inpatient Follow Up Note   HPI: 74 yo male [place] (followed at [facility]) brought to ER feeling weak. Reports have nausea/vomiting/diarrhea for two weeks prior to admission. 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 Hx of chronic hypotension on midodrine (baseline SBP in 80's), and ESRD. Missed his dialysis regimen on 08/15/19 due to feeling weak.  Palliative care was consulted to aid in goals of care conversations.  Today's Discussion (08/24/2019): Chart reviewed. I met with Clayton [name] at bedside, he is in good spirits this morning asking when he may leave the hospital. I shared with him that it is up to his primary team though it does seem like discharge is getting closer.   In the afternoon it appears that DME  O2 has been ordered and are to be delivered to bedside prior to discharge.   Discussed with patient the importance of continued conversation with family and their  medical providers regarding overall plan of care and treatment options, ensuring decisions are within the context of the patients values and GOCs.  Questions and concerns addressed   Vital Signs Vitals:   08/24/19 0404 08/24/19 1000  BP: (!) 72/57   Pulse: 66   Resp: 18   Temp: (!) 97.5 F (36.4 C)   SpO2: 95% (!) 88%    Intake/Output Summary (Last 24 hours) at 08/24/2019 1251 Last data filed at 08/24/2019 0900 Gross per 24 hour  Intake 924.39 ml  Output 0 ml  Net 924.39 ml   Last Weight  Most recent update: 08/24/2019  5:53 AM   Weight  85.9 kg (189 lb 6 oz)           Physical Exam Vitals and nursing note reviewed.  HENT:     Head: Normocephalic.     Nose: Nose normal.     Mouth/Throat:     Mouth: Mucous membranes are dry.  Eyes:     Pupils: Pupils are equal, round, and reactive to light.  Cardiovascular:     Rate and Rhythm: Normal rate and regular rhythm.  Pulmonary:     Effort: Pulmonary effort is normal.     Comments: 2L Abdominal:     Palpations: Abdomen is soft.  Musculoskeletal:     Cervical back: Normal range of motion.  Skin:    General: Skin is warm and dry.     Capillary Refill: Capillary refill takes less than 2 seconds.  Neurological:     Mental Status: He is alert and oriented to person, place, and time.  Psychiatric:        Mood and Affect: Mood normal.   SUMMARY OF RECOMMENDATIONS   DNAR/DNI  OP Palliative care referral  [place] for discharge after DME delivery per notes  Time Spent: 15 Greater than 50% of the time was spent in counseling and coordination of care ______________________________________________________________________________________ [facility] Team Team Cell Phone: [phone] Please utilize secure chat with additional questions, if there is no response within 30 minutes please call the above phone number  Palliative Medicine Team providers are available by phone from 7am to 7pm daily and can be reached through the team cell phone.  Should this patient require assistance outside of these hours, please call the patient's attending physician.

## 2019-08-24 NOTE — Plan of Care (Signed)
Pt daughter was instructed that pt could be d/c home. Due to need for special Lucianne Lei and wheelchair returned to [facility]  to bring back this evening

## 2019-08-24 NOTE — Progress Notes (Signed)
Family unable to coordinate care in order for safe d/c this evening. Will cancel d/c per Dr Eliezer Bottom for tonight. Family is able to arrange transportation after Hemodialysis 4/12.

## 2019-08-24 NOTE — Discharge Summary (Signed)
Physician Discharge Summary  Patient ID: KADEN DAUGHDRILL MRN: [id number] DOB/AGE: 1945/06/06 74 y.o.  Admit date: 08/18/2019 Discharge date: 08/24/2019    Discharge Diagnoses:  Septic Shock with suspected GI Source Severe HFrEF with suspected element of Cardiogenic Shock  Chronic Hypotension Acute Hypoxic Respiratory Failure secondary to Pulmonary Edema  ESRD on HD  Macrocytic Anemia  Thrombocytopenia  Neuropathy  Goals of Care                                                                      DISCHARGE PLAN BY DIAGNOSIS      Septic Shock with suspected GI Source  Discharge Plan: Completed antibiotics while inpatient. No clear source identified.  No acute follow up at time of discharge.   Severe HFrEF with suspected element of Cardiogenic Shock  Chronic Hypotension  Discharge Plan: Midodrine dosing increased to 20 mg TID [facility] with [facility] as [facility]   Acute Hypoxic Respiratory Failure secondary to Pulmonary Edema   Discharge Plan: O2 needs assessment with desaturations at rest to 86%  Home O2 arranged for 2L   ESRD on HD   Discharge Plan: Continue HD as previously planned M/W/F Follow up with Nephrology as outpatient  Continue MVI, Sensipar Continue nutritional supplements   Macrocytic Anemia  Thrombocytopenia   Discharge Plan: Follow CBC intermittently with Nephrology   Neuropathy   Discharge Plan: Continue gabapentin   HLD   Discharge Plan: Continue Pravastatin   Goals of Care Met with Palliative Care during admit.  Patient elected for DNR / DNI status.  Outpatient transitions of care referral to [place] EVENTS 4/05 Admit  4/08 Palliative care consult 4/09 DNR  4/10 Off vasopressor support           4/11 O2 needs assessment with desaturation to 86%, 2L ordered for discharge  SIGNIFICANT DIAGNOSTIC STUDIES  ECHO 4/7 >> LVEF <20% with global  hypokinesis and areas of akinesis of the LV  MICRO DATA  COVID 4/5 >> negative  Influenza A/B 4/5 >> negative  MRSA PCR 4/5 >> negative  C-Diff PCR 4/5 >> negative  GI Pathogen PCR 4/5 >> negative BCx2 4/5 >> negative  ANTIBIOTICS Vancomycin 4/5 >> 4/6  Cefepime 4/5 >> 4/7  Flagyl 4/5 >> 4/7  Unasyn 4/7 >> 4/11  CONSULTS Nephrology  Palliative Care   Discharge Exam: General: adult male lying in bed in NAD HEENT: MM pink/moist, [place] O2 Neuro: AAOx4, speech clear, MAE CV: s1s2 RRR, no m/r/g PULM:  Non-labored on [place] O2 2.5L, sats 90-94% at rest while in the room, lungs bilaterally clear, diminished bases  GI: soft, bsx4 active  Extremities: warm/dry, no edema, BLE amputation  Skin: no rashes or lesions  Vitals:   08/24/19 0158 08/24/19 0404 08/24/19 0500 08/24/19 1000  BP: (!) 74/51 (!) 72/57    Pulse: 81 66    Resp: 16 18    Temp: (!) 97.5 F (36.4 C) (!) 97.5 F (36.4 C)    TempSrc: Oral Oral    SpO2: 97% 95%  (!) 88%  Weight:  85.9 kg   Height:         Discharge Labs  BMET Recent Labs  Lab 08/18/19 1120 08/18/19 1120 08/19/19 0245 08/19/19 0245 08/19/19 2351 08/19/19 2351 08/21/19 0224 08/22/19 0311  NA 138  --  140  --  134*  --  134* 137  K 4.2   < > 4.6   < > 4.4   < > 4.6 3.5  CL 98  --  100  --  96*  --  99 99  CO2 17*  --  20*  --  22  --  18* 21*  GLUCOSE 82  --  96  --  86  --  130* 82  BUN 66*  --  68*  --  34*  --  41* 48*  CREATININE 8.52*  --  8.59*  --  5.17*  --  5.90* 6.89*  CALCIUM 8.3*  --  8.2*  --  7.5*  --  7.0* 7.2*  MG  --   --   --   --  1.9  --   --   --   PHOS  --   --   --   --   --   --   --  4.2   < > = values in this interval not displayed.    CBC Recent Labs  Lab 08/20/19 0704 08/21/19 0224 08/22/19 0822  HGB 11.4* 10.8* 10.2*  HCT 35.0* 33.3* 31.8*  WBC 13.6* 13.4* 9.4  PLT 125* 129* 142*    Anti-Coagulation Recent Labs  Lab 08/18/19 1120  INR 1.8*    Discharge Instructions    Call MD for:   difficulty breathing, headache or visual disturbances   Complete by: As directed    Call MD for:  extreme fatigue   Complete by: As directed    Call MD for:  hives   Complete by: As directed    Call MD for:  persistant dizziness or light-headedness   Complete by: As directed    Call MD for:  persistant nausea and vomiting   Complete by: As directed    Call MD for:  redness, tenderness, or signs of infection (pain, swelling, redness, odor or green/yellow discharge around incision site)   Complete by: As directed    Call MD for:  severe uncontrolled pain   Complete by: As directed    Call MD for:  temperature >100.4   Complete by: As directed    Diet - low sodium heart healthy   Complete by: As directed    Increase activity slowly   Complete by: As directed      Follow-up Information    [facility], [facility]. Schedule an appointment as soon as possible for a visit.   Why: Call to make a follow up appointment post discharge.   Contact information: [place] [address] [phone]           Allergies as of 08/24/2019      Reactions   Simvastatin Other (See Comments)   unknown      Medication List    TAKE these medications   acetaminophen 325 MG tablet Commonly known as: TYLENOL Take 650 mg by mouth every 4 (four) hours as needed for mild pain or fever.   b complex-vitamin c-folic acid 0.8 MG Tabs tablet Take 1 tablet by mouth daily.   cinacalcet 60 MG tablet Commonly known as: SENSIPAR Take 60 mg by mouth daily.   feeding supplement Liqd Take  1 Container by mouth 3 (three) times daily between meals.   gabapentin 100 MG capsule Commonly known as: NEURONTIN Take 100 mg by mouth at bedtime.   latanoprost 0.005 % ophthalmic solution Commonly known as: XALATAN Apply 1 drop to eye at bedtime.   melatonin 3 MG Tabs tablet Take 3 mg by mouth at bedtime as needed (sleep).   midodrine 10 MG tablet Commonly known as: PROAMATINE Take 2 tablets  (20 mg total) by mouth 3 (three) times daily with meals. What changed:   medication strength  how much to take   Multiple Vitamins tablet Take 1 tablet by mouth daily.   pravastatin 10 MG tablet Commonly known as: PRAVACHOL Take 5 mg by mouth at bedtime.            Durable Medical Equipment  (From admission, onward)         Start     Ordered   08/24/19 0826  For home use only DME oxygen  Once    Question Answer Comment  Length of Need 12 Months   Mode or (Route) Nasal cannula   Liters per Minute 2   Frequency Continuous (stationary and portable oxygen unit needed)   Oxygen conserving device Yes   Oxygen delivery system Gas      08/24/19 0825            Disposition: Home.  O2 arranged for discharge at 2L.  No other new home health needs identified at time of discharge.   Discharged Condition: RONY RATZ has met maximum benefit of inpatient care and is medically stable and cleared for discharge.  Patient is pending follow up as above.      Time spent on disposition:  35 Minutes.   Signed: Noe Gens, NP-C [facility]

## 2019-08-24 NOTE — Progress Notes (Addendum)
[facility] NEPHROLOGY PROGRESS NOTE  Assessment/ Plan: Pt is a 74 y.o. yo male with a chronic hypertension, ESRD on HD at [facility], had recent Covid vaccine, admitted with weakness, diarrhea and hypotension.  He received IV fluid in the ER.  #Hypotension, chronic and asymptomatic: Patient reported that his blood pressure usually around 70s before dialysis at OP.    He probably has low blood pressure because of low EF.  Required Levophed in the beginning which is off now. He had HD on 4/9, unable to do UF because of hypotension. Continue midodrine 20 mg 3 times daily. Since patient is on hemodialysis for many years and chronically low BP, I think it is safe for him to go to his own HD unit to continue dialysis.  If he remains in the hospital I will order HD for tomorrow attempting some UF.  I have discussed with ICU team.  Given in the stage CHF, ESRD and multiple comorbidities the overall prognosis looks poor.   #Chronic systolic CHF: EF less than 20%.  This is probably the contributing factor for hypotension.  #ESRD on HD at [facility], MWF: Had intermittent hemodialysis and tolerated well except chronic hypotension.  Remains asymptomatic.  He has AV fistula for the access.    # Anemia of CKD: Hemoglobin at goal.  # Secondary hyperparathyroidism phosphorus level at goal.  Discontinue Sensipar because of hypocalcemia.  #Leukocytosis: Cultures are negative.  Completed Unasyn.  WBC count improved.  Subjective: Seen and examined.  Out of  ICU to the floor.  Blood pressure remains low however clinically asymptomatic.  Denies headache, dizziness, nausea, vomiting, chest pain, shortness of breath.  Possibly discharge home today with oxygen.    Objective Vital signs in last 24 hours: Vitals:   08/24/19 0158 08/24/19 0404 08/24/19 0500 08/24/19 1000  BP: (!) 74/51 (!) 72/57    Pulse: 81 66    Resp: 16 18    Temp: (!) 97.5 F (36.4 C) (!) 97.5 F (36.4 C)    TempSrc:  Oral Oral    SpO2: 97% 95%  (!) 88%  Weight:   85.9 kg   Height:       Weight change: -1.7 kg  Intake/Output Summary (Last 24 hours) at 08/24/2019 1110 Last data filed at 08/24/2019 0900 Gross per 24 hour  Intake 934.38 ml  Output 0 ml  Net 934.38 ml       Labs: Basic Metabolic Panel: Recent Labs  Lab 08/19/19 2351 08/21/19 0224 08/22/19 0311  NA 134* 134* 137  K 4.4 4.6 3.5  CL 96* 99 99  CO2 22 18* 21*  GLUCOSE 86 130* 82  BUN 34* 41* 48*  CREATININE 5.17* 5.90* 6.89*  CALCIUM 7.5* 7.0* 7.2*  PHOS  --   --  4.2   Liver Function Tests: Recent Labs  Lab 08/18/19 1120 08/19/19 0245 08/22/19 0311  AST 29 51*  --   ALT 21 28  --   ALKPHOS 135* 131*  --   BILITOT 1.8* 1.1  --   PROT 7.8 7.2  --   ALBUMIN 2.9* 2.6* 2.3*   Recent Labs  Lab 08/18/19 1120  LIPASE 26   No results for input(s): AMMONIA in the last 168 hours. CBC: Recent Labs  Lab 08/18/19 1120 08/18/19 1120 08/19/19 0245 08/19/19 0245 08/19/19 1114 08/19/19 1114 08/20/19 0704 08/21/19 0224 08/22/19 0822  WBC 14.1*   < > 20.6*   < > 16.7*   < > 13.6* 13.4*  9.4  NEUTROABS 13.1*  --   --   --   --   --   --   --   --   HGB 11.1*   < > 11.2*   < > 10.9*   < > 11.4* 10.8* 10.2*  HCT 35.1*   < > 34.4*   < > 32.9*   < > 35.0* 33.3* 31.8*  MCV 102.3*   < > 100.3*  --  99.1  --  100.9* 100.9* 99.7  PLT 109*   < > 113*   < > 122*   < > 125* 129* 142*   < > = values in this interval not displayed.   Cardiac Enzymes: No results for input(s): CKTOTAL, CKMB, CKMBINDEX, TROPONINI in the last 168 hours. CBG: Recent Labs  Lab 08/23/19 1654  GLUCAP 105*    Iron Studies: No results for input(s): IRON, TIBC, TRANSFERRIN, FERRITIN in the last 72 hours. Studies/Results: No results found.  Medications: Infusions: . sodium chloride 10 mL/hr at 08/23/19 1200  . sodium chloride Stopped (08/22/19 1049)  . sodium chloride    . sodium chloride      Scheduled Medications: . Chlorhexidine  Gluconate Cloth  6 each Topical Daily  . Chlorhexidine Gluconate Cloth  6 each Topical Q0600  . feeding supplement (ENSURE ENLIVE)  237 mL Oral BID BM  . gabapentin  100 mg Oral QHS  . heparin injection (subcutaneous)  5,000 Units Subcutaneous Q8H  . latanoprost  1 drop Both Eyes QHS  . mouth rinse  15 mL Mouth Rinse BID  . midodrine  20 mg Oral TID WC  . multivitamin  1 tablet Oral QHS  . pantoprazole  40 mg Oral Daily    have reviewed scheduled and prn medications.  Physical Exam: General: Pleasant male, sitting on bed comfortable, not in distress Heart:RRR, s1s2 nl Lungs:clear b/l, no crackle Abdomen:soft, Non-tender, non-distended Extremities: Bilateral BKA Dialysis Access: Left upper extremity AV fistula has good thrill and bruit Neurology: Alert, awake, following commands.  Antwanette Wesche Prasad Onix Jumper 08/24/2019,11:10 AM  LOS: 6 days  Pager: [phone]

## 2019-08-25 LAB — RENAL FUNCTION PANEL
Albumin: 2.1 g/dL — ABNORMAL LOW (ref 3.5–5.0)
Anion gap: 13 (ref 5–15)
BUN: 33 mg/dL — ABNORMAL HIGH (ref 8–23)
CO2: 24 mmol/L (ref 22–32)
Calcium: 7 mg/dL — ABNORMAL LOW (ref 8.9–10.3)
Chloride: 100 mmol/L (ref 98–111)
Creatinine, Ser: 4.96 mg/dL — ABNORMAL HIGH (ref 0.61–1.24)
GFR calc Af Amer: 13 mL/min — ABNORMAL LOW (ref >60)
GFR calc non Af Amer: 11 mL/min — ABNORMAL LOW (ref >60)
Glucose, Bld: 103 mg/dL — ABNORMAL HIGH (ref 70–99)
Phosphorus: 3 mg/dL (ref 2.5–4.6)
Potassium: 3.7 mmol/L (ref 3.5–5.1)
Sodium: 137 mmol/L (ref 135–145)

## 2019-08-25 LAB — CBC
HCT: 31 % — ABNORMAL LOW (ref 39.0–52.0)
Hemoglobin: 10 g/dL — ABNORMAL LOW (ref 13.0–17.0)
MCH: 32.5 pg (ref 26.0–34.0)
MCHC: 32.3 g/dL (ref 30.0–36.0)
MCV: 100.6 fL — ABNORMAL HIGH (ref 80.0–100.0)
Platelets: 134 10*3/uL — ABNORMAL LOW (ref 150–400)
RBC: 3.08 MIL/uL — ABNORMAL LOW (ref 4.22–5.81)
RDW: 16 % — ABNORMAL HIGH (ref 11.5–15.5)
WBC: 4.8 10*3/uL (ref 4.0–10.5)
nRBC: 0.6 % — ABNORMAL HIGH (ref 0.0–0.2)

## 2019-08-25 NOTE — Progress Notes (Signed)
Clayton [name] to be D/C'd per MD order. Discussed with the patient and all questions fully answered. ? VSS, Skin clean, dry and intact without evidence of skin break down, no evidence of skin tears noted. ? IV catheter discontinued intact. Site without signs and symptoms of complications. Dressing and pressure applied. ? An After Visit Summary was printed and given to the patient. Patient informed where to pickup prescriptions. ? D/c education completed with patient/family including follow up instructions, medication list, d/c activities limitations if indicated, with other d/c instructions as indicated by MD - patient able to verbalize understanding, all questions fully answered.  ? Patient instructed to return to ED, call 911, or call MD for any changes in condition.  ? Patient to be escorted via [place], and D/C home via private auto with daughter.

## 2019-08-25 NOTE — Progress Notes (Signed)
Chaplain responded to request from patient's nurse who reported Mr. Honse wanted to update his AD so his daughter would be his spokesperson, rather than his wife because they are now divorced.  Mr. Stille denied any need to update his AD.  Chaplain wondered if that was because his "wife" was in the room.  She said he already had an AD on file from the [place] and he didn't need one.  Chaplain asked some oblique questions of Mr. Butt to determine if he really wanted to update his AD but was not wanting to do that in front of his "wife."  Mr. Ruhlman denied needing to update. Given that Mr. Holsworth will discharged later today, Chaplain advised if he ever wanted to update his AD he could handle that at his doctor's office and it would become the one on his record.    De Burrs Chaplain Resident

## 2019-08-25 NOTE — Progress Notes (Signed)
Followed-up with Hemodialysis if they can do pt's HD early today. Said she will ff/up with RN assigned.

## 2019-08-25 NOTE — Progress Notes (Signed)
Just spoke with CN of HD unit to ff/up if pt can go ealier this Am. Said she'll gonna ff/up on it.

## 2019-08-25 NOTE — Procedures (Signed)
Patient seen on Hemodialysis. BP (!) 73/33   Pulse 77   Temp 97.9 F (36.6 C) (Oral)   Resp 16   Ht 6\' 5"  (1.956 m)   Wt 87.5 kg   SpO2 98%   BMI 22.87 kg/m   QB 400, UF goal 1L Tolerating treatment without complaints at this time. Possible DC home later today after dialysis this morning.   Elmarie Shiley MD [facility]. Office # [phone] Pager # [phone] 9:50 AM

## 2019-08-25 NOTE — Progress Notes (Signed)
PULMONARY / CRITICAL CARE MEDICINE   NAME:  Clayton [name], MRN:  [id number], DOB:  [birth year], LOS: 7 ADMISSION DATE:  08/18/2019, CONSULTATION DATE:  08/18/2019 REFERRING MD:  Dr. Sherry Ruffing, ER, CHIEF COMPLAINT:  Diarrhea  BRIEF HISTORY:    Clayton [name] is a 74 yo male veteran who receives care at [place] and [place].  Hx of ESRD on HD M/W/F and chronic hypotension.  He wasn't able to get HD on 08/15/19 due to feeling weak.  Started having nausea, vomiting, and diarrhea about 2 weeks ago.  He received 2nd dose of [place] vaccine on 08/16/19.  He reports hx of chronic hypotension with baseline SBP in 80's.  His BP initially in ER was 53/38 and improved after getting 2.5 liters normal saline in ER.    SIGNIFICANT PAST MEDICAL HISTORY   CHF, ESRD, DM, CAD, s/p AICD, PAD s/p b/l BKA  SIGNIFICANT EVENTS:  4/05 Admit 4/7>>echo showing EF<20% 4/8>>palliative care consulted. Work on weaning levo  STUDIES:   4/5 CXR>>Low lung volumes with increased interstitial markings bilaterally with areas of juxta diaphragmatic airspace disease at the lung bases. Probable background pulmonary edema with small fissural fluid along the minor fissure in the right chest. Airspace disease at the lung bases likely atelectasis.  4/7 echocardiogram>>EF <20% with global hypokinesis and areas of akinesis of the LV  CULTURES:  SARS CoV2 4/05 >>NEG RVP 4/5>>negative C diff PCR 4/05 >>negative GI pathogen 4/05 >>NEG Blood 4/05 >> NGTD  ANTIBIOTICS:  Vancomycin 4/05>>4/6 Cefepime 4/05 >>4/07 Flagyl 4/05 >>4/07 Unasyn 4/07>>  LINES/TUBES:  n/a  CONSULTANTS:  Nephrology Palliative  SUBJECTIVE:  Seen sitting up in bed prior to transfer to HD, he denies any pain or discomfort. States he is ready to be discharged home.   CONSTITUTIONAL: BP (!) 73/48 (BP Location: Right Arm)   Pulse 76   Temp 97.8 F (36.6 C) (Axillary)   Resp 18   Ht '6\' 5"'$  (1.956 m)   Wt 87.5 kg   SpO2 99%   BMI 22.87 kg/m    I/O last 3 completed shifts: In: 744.4 [P.O.:647; IV Piggyback:97.4] Out: 0     PHYSICAL EXAM: General: Chronically ill appearing elderly male with flat affect sitting up in, in NAD HEENT: [place]/AT, MM pink/moist, PERRL, sclera non-icteric  Neuro: Alert and oriented x3, able to answer all questions, non-focal  CV: s1s2 regular rate and rhythm, no murmur, rubs, or gallops, ICD pocket  PULM:  Clear to ascultation bilaterally, no added breath sounds, no increased work of breathing GI: soft, bowel sounds active in all 4 quadrants, non-tender, non-distended Extremities: warm/dry, no edema, bilateral BKA, AV fistula Left arm  Skin: no rashes or lesions   ASSESSMENT AND PLAN    Septic shock.  -Suspect GI source given presentation with N/V/D. WBC downtrending. Remains afebrile since admission.  NG on blood cultures. GI panel/C-diff negative P: Completed antibiotics while inpatient. No clear source identified.  No acute follow up at time of discharge.  Progression of Severe HFrEF vs cardiogenic shock.  -Echo showing EF<20% P: Midodrine dosing increased to 20 mg TID [facility]  Palliative care consulted to assist with goals of care in terminal stages of ESRD and HF.   Acute hypoxic respiratory failure 2/2 pulmonary edema. -Chest xray from 4/7 with worsening pulmonary edema, cardiomegaly P: Supplemental oxygen needs assessed during admission and patient meets criteria for supplemental oxygen at discharge given desaturation of 88%. Home oxygen has  been arranged   ESRD on HD P: Continue current iHD schedule of M/W/F Will complete iHD today prior to discharge 4/12 Follow up with Nephrology as an outpatient  Continue MVI, Sensipar, and nutritional supplements at discharge   Macrocytic anemia- Thrombocytopenia -Stable at time of discharge  P: Follow up CBC intermittently with Nephrology   Neuropathy. P: Continue Gabapentin at  discharge   Goals of Care Met with Palliative Care during admit.  Patient elected for DNR / DNI status. Outpatient transitions of care referral to Palliative Care   Best Practice / Goals of Care / Disposition.   DVT PROPHYLAXIS: SQ heparin SUP: Protonix NUTRITION: renal diet MOBILITY: OOB with assist GOALS OF CARE: full code FAMILY DISCUSSIONS: Daughter updated 4/11, will attempt to update again today  DISPOSITION Patient remains stable for discharge see discharge summary completed 4/11   LABS/IMAGING  Glucose Recent Labs  Lab 08/23/19 1654  GLUCAP 105*    BMET Recent Labs  Lab 08/19/19 2351 08/21/19 0224 08/22/19 0311  NA 134* 134* 137  K 4.4 4.6 3.5  CL 96* 99 99  CO2 22 18* 21*  BUN 34* 41* 48*  CREATININE 5.17* 5.90* 6.89*  GLUCOSE 86 130* 82    Liver Enzymes Recent Labs  Lab 08/18/19 1120 08/19/19 0245 08/22/19 0311  AST 29 51*  --   ALT 21 28  --   ALKPHOS 135* 131*  --   BILITOT 1.8* 1.1  --   ALBUMIN 2.9* 2.6* 2.3*    Electrolytes Recent Labs  Lab 08/19/19 2351 08/21/19 0224 08/22/19 0311  CALCIUM 7.5* 7.0* 7.2*  MG 1.9  --   --   PHOS  --   --  4.2    CBC Recent Labs  Lab 08/20/19 0704 08/21/19 0224 08/22/19 0822  WBC 13.6* 13.4* 9.4  HGB 11.4* 10.8* 10.2*  HCT 35.0* 33.3* 31.8*  PLT 125* 129* 142*    ABG No results for input(s): PHART, PCO2ART, PO2ART in the last 168 hours.  Coag's Recent Labs  Lab 08/18/19 1120  APTT 40*  INR 1.8*    Sepsis Markers Recent Labs  Lab 08/18/19 1120 08/18/19 1441 08/18/19 2343  LATICACIDVEN 5.1* 3.0* 1.8   Signature Johnsie Cancel, NP-C [facility] Contact / Pager information can be found on Amion  08/25/2019, 7:56 AM

## 2019-08-28 ENCOUNTER — Telehealth: Payer: Self-pay | Admitting: Adult Health Nurse Practitioner

## 2019-08-28 NOTE — Telephone Encounter (Signed)
Called patient's home # to schedule the Palliative Consult, no answer - unable to leave message no voicemail set up.  I then called patient's cell, with no answer, message left with reason for call along with my name and contact number.

## 2019-09-01 ENCOUNTER — Telehealth: Payer: Self-pay | Admitting: Adult Health Nurse Practitioner

## 2019-09-01 MED ORDER — ASPIRIN 81 MG PO CHEW
81.00 | CHEWABLE_TABLET | ORAL | Status: DC
Start: 2019-09-09 — End: 2019-09-01

## 2019-09-01 MED ORDER — BROMHIST-NR PO
0.00 | ORAL | Status: DC
Start: 2019-09-01 — End: 2019-09-01

## 2019-09-01 MED ORDER — MELATONIN 3 MG PO TABS
3.00 | ORAL_TABLET | ORAL | Status: DC
Start: 2019-09-08 — End: 2019-09-01

## 2019-09-01 MED ORDER — ESOMEPRAZOLE MAGNESIUM 40 MG PO PACK
40.00 | PACK | ORAL | Status: DC
Start: 2019-09-09 — End: 2019-09-01

## 2019-09-01 MED ORDER — T.E.D. BELTED THIGH/M-LONG MISC
50.00 | Status: DC
Start: 2019-09-01 — End: 2019-09-01

## 2019-09-01 MED ORDER — CHLORHEXIDINE GLUCONATE 0.12 % MT SOLN
5.00 | OROMUCOSAL | Status: DC
Start: 2019-09-08 — End: 2019-09-01

## 2019-09-01 MED ORDER — Medication
5000.00 | Status: DC
Start: ? — End: 2019-09-01

## 2019-09-01 MED ORDER — BENICAR 20 MG PO TABS
12.50 | ORAL_TABLET | ORAL | Status: DC
Start: ? — End: 2019-09-01

## 2019-09-01 MED ORDER — Medication
0.00 | Status: DC
Start: ? — End: 2019-09-01

## 2019-09-01 MED ORDER — GABAPENTIN 250 MG/5ML PO SOLN
100.00 | ORAL | Status: DC
Start: 2019-09-08 — End: 2019-09-01

## 2019-09-01 MED ORDER — HEPARIN SODIUM (PORCINE) 5000 UNIT/ML IJ SOLN
5000.00 | INTRAMUSCULAR | Status: DC
Start: 2019-09-08 — End: 2019-09-01

## 2019-09-01 MED ORDER — OVULATION PREDICTOR ONE STEP VI TEST
0.00 | Status: DC
Start: ? — End: 2019-09-01

## 2019-09-01 MED ORDER — CINACALCET HCL 60 MG PO TABS
60.00 | ORAL_TABLET | ORAL | Status: DC
Start: 2019-09-09 — End: 2019-09-01

## 2019-09-01 MED ORDER — Medication
2.00 | Status: DC
Start: 2019-09-01 — End: 2019-09-01

## 2019-09-01 MED ORDER — Medication
0.03 | Status: DC
Start: ? — End: 2019-09-01

## 2019-09-01 MED ORDER — CALCIUM-VITAMIN D-VITAMIN K 600-1000-90 MG-UNT-MCG PO TABS
50.00 | ORAL_TABLET | ORAL | Status: DC
Start: ? — End: 2019-09-01

## 2019-09-01 MED ORDER — Medication
Status: DC
Start: ? — End: 2019-09-01

## 2019-09-01 MED ORDER — MIDODRINE HCL 10 MG PO TABS
20.00 | ORAL_TABLET | ORAL | Status: DC
Start: 2019-09-08 — End: 2019-09-01

## 2019-09-01 NOTE — Telephone Encounter (Signed)
Called patient's home number to schedule Palliative Consult, no answer.  I called listed cell number with no answer - left message with reason for call along with my name and contact number.  I also called patient's daughter [name] and spoke with her and explained why I was calling and she said that the patient had been readmitted to hospital at [facility] on Saturday.  I told daughter that I would notify our Hospital Liaison RN of this and would contact her after patient has been discharged and she was ina agreement with this.

## 2019-09-05 MED ORDER — VANCOMYCIN HCL IN NACL 1.25-0.9 GM/250ML-% IV SOLN
1250.00 | INTRAVENOUS | Status: DC
Start: 2019-09-06 — End: 2019-09-05

## 2019-09-05 MED ORDER — INSULIN LISPRO 100 UNIT/ML ~~LOC~~ SOLN
0.00 | SUBCUTANEOUS | Status: DC
Start: 2019-09-08 — End: 2019-09-05

## 2019-09-05 MED ORDER — HEPARIN SODIUM (PORCINE) 1000 UNIT/ML IJ SOLN
1.40 | INTRAMUSCULAR | Status: DC
Start: ? — End: 2019-09-05

## 2019-09-05 MED ORDER — HYDROCORTISONE NA SUCCINATE PF 100 MG IJ SOLR
50.00 | INTRAMUSCULAR | Status: DC
Start: 2019-09-05 — End: 2019-09-05

## 2019-09-05 MED ORDER — GENERIC EXTERNAL MEDICATION
0.00 | Status: DC
Start: ? — End: 2019-09-05

## 2019-09-05 MED ORDER — GENERIC EXTERNAL MEDICATION
Status: DC
Start: ? — End: 2019-09-05

## 2019-09-05 MED ORDER — PNEUMOCOCCAL VAC POLYVALENT 25 MCG/0.5ML IJ INJ
0.50 | INJECTION | INTRAMUSCULAR | Status: DC
Start: ? — End: 2019-09-05

## 2019-09-05 MED ORDER — INFLUENZA VAC SPLIT QUAD 0.5 ML IM SUSY
0.50 | PREFILLED_SYRINGE | INTRAMUSCULAR | Status: DC
Start: ? — End: 2019-09-05

## 2019-09-05 MED ORDER — GENERIC EXTERNAL MEDICATION
1.00 | Status: DC
Start: 2019-09-05 — End: 2019-09-05

## 2019-09-09 MED ORDER — DEXTROSE 10 % IV SOLN
10.00 | INTRAVENOUS | Status: DC
Start: ? — End: 2019-09-09

## 2019-09-09 MED ORDER — GENERIC EXTERNAL MEDICATION
Status: DC
Start: ? — End: 2019-09-09

## 2019-09-09 MED ORDER — ATROPINE SULFATE 1 % OP SOLN
2.00 | OPHTHALMIC | Status: DC
Start: ? — End: 2019-09-09

## 2019-09-09 MED ORDER — GENERIC EXTERNAL MEDICATION
40.00 | Status: DC
Start: ? — End: 2019-09-09

## 2019-09-09 MED ORDER — GENERIC EXTERNAL MEDICATION
0.00 | Status: DC
Start: ? — End: 2019-09-09

## 2019-09-09 MED ORDER — POTASSIUM & SODIUM PHOSPHATES 280-160-250 MG PO PACK
2.00 | PACK | ORAL | Status: DC
Start: 2019-09-08 — End: 2019-09-09

## 2019-09-13 DEATH — deceased

## 2020-11-20 IMAGING — CT CT ABDOMEN AND PELVIS WITH CONTRAST
2 of 9 series · 13 of 46 positions shown, 18 images · IV contrast (APPLIED)
Comparison: None.

CLINICAL DATA: Incarcerated hernia

EXAM:
CT ABDOMEN AND PELVIS WITH CONTRAST
TECHNIQUE: Multidetector CT imaging of the abdomen and pelvis was performed
using the standard protocol following bolus administration of
intravenous contrast.
CONTRAST:  100mL OMNIPAQUE IOHEXOL 300 MG/ML  SOLN

[Series 3: abdomen 5.0 · axial · 0.77mm/px · z∈[-886,-401]mm · 10 of 115 slices shown, 15 images]
[im 9/115  soft-tissue]
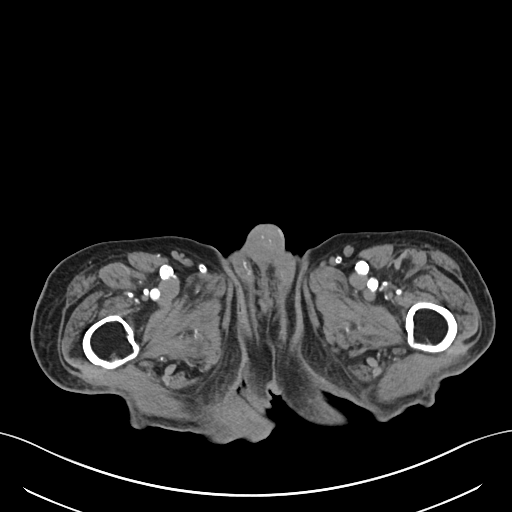
[im 9/115  bone]
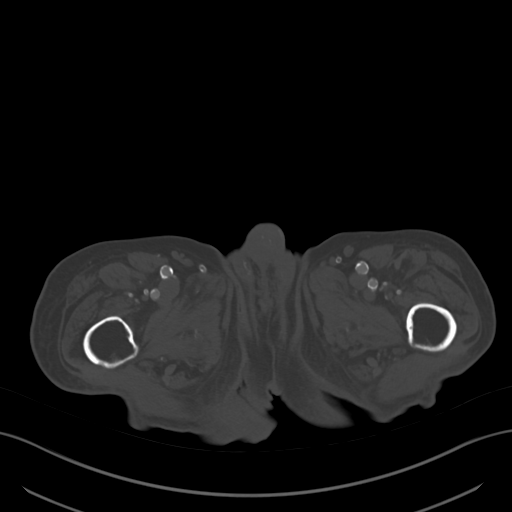
[im 27/115  soft-tissue]
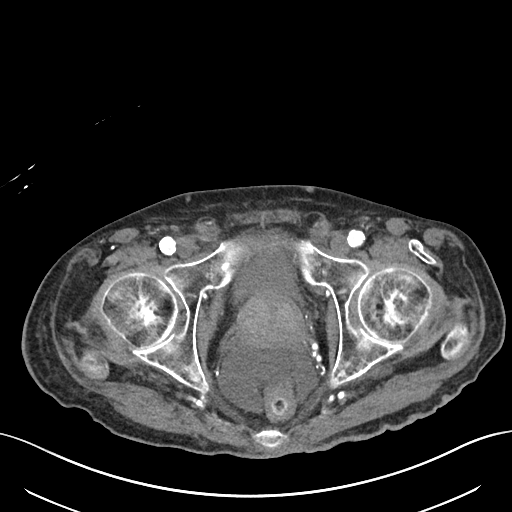
[im 36/115  soft-tissue]
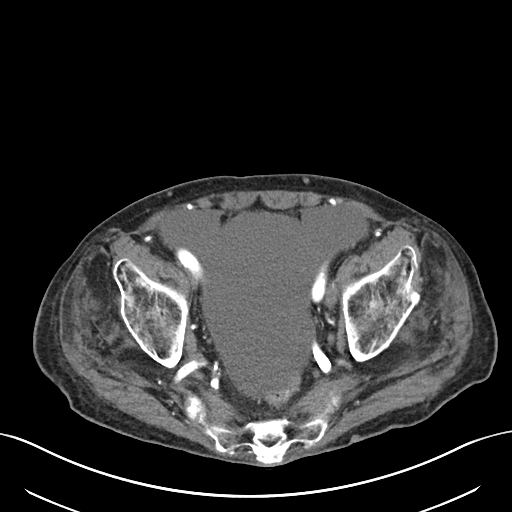
[im 44/115  soft-tissue]
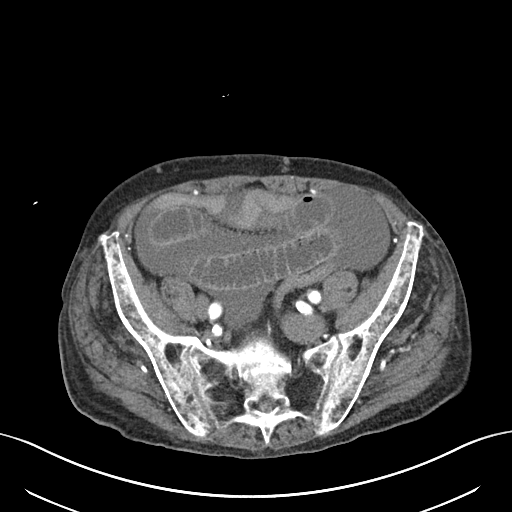
[im 62/115  soft-tissue]
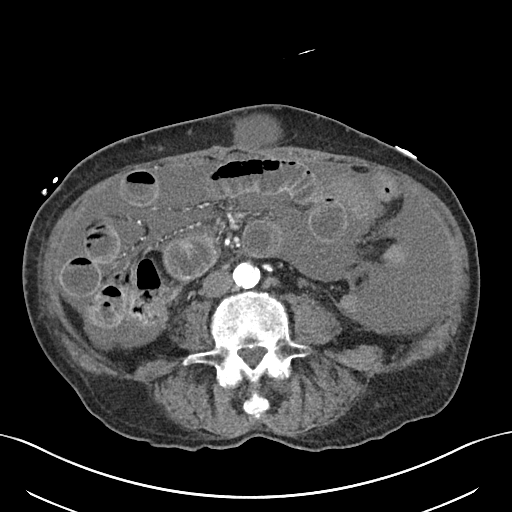
[im 71/115  soft-tissue]
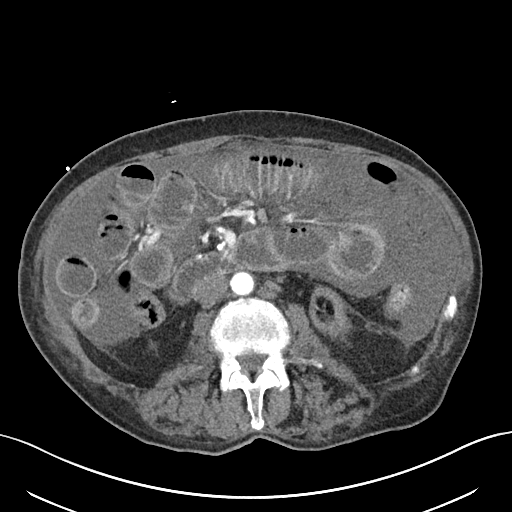
[im 79/115  soft-tissue]
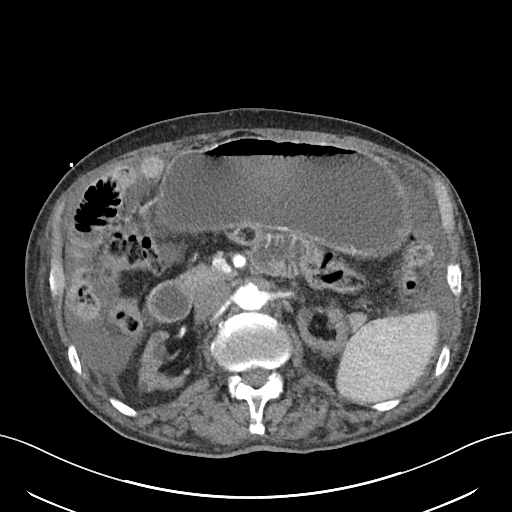
[im 79/115  lung]
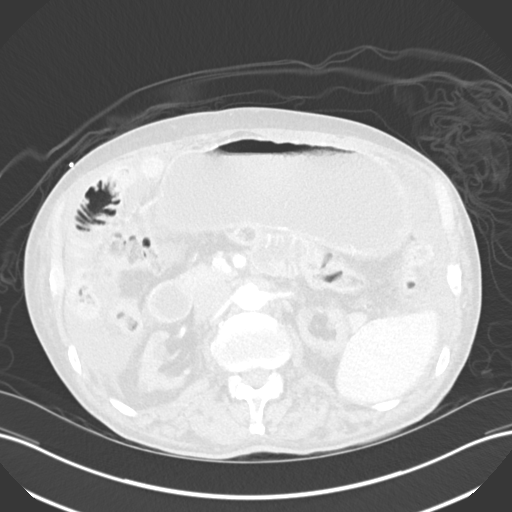
[im 88/115  lung]
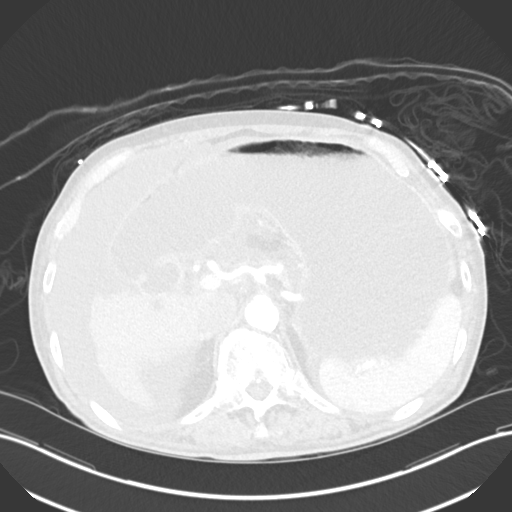
[im 97/115  soft-tissue]
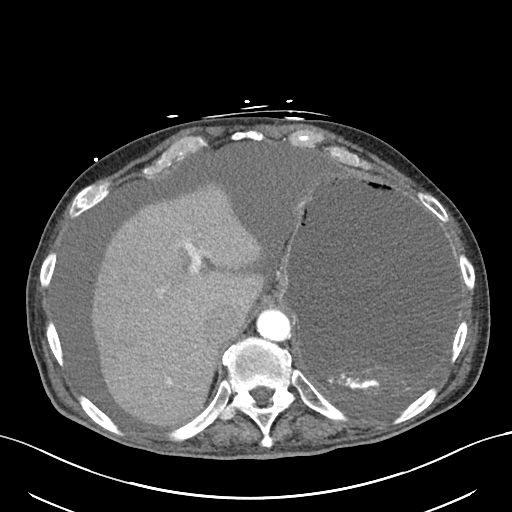
[im 97/115  lung]
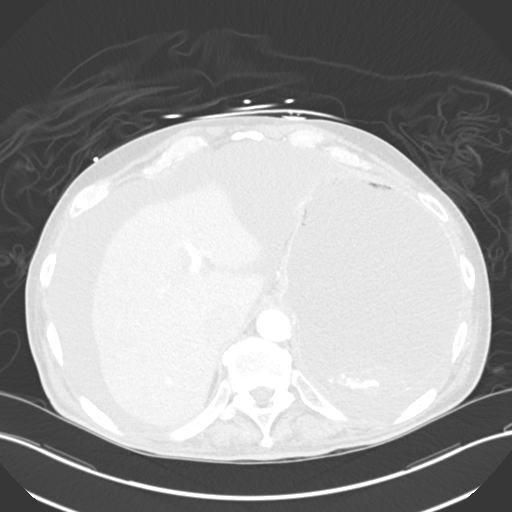
[im 106/115  soft-tissue]
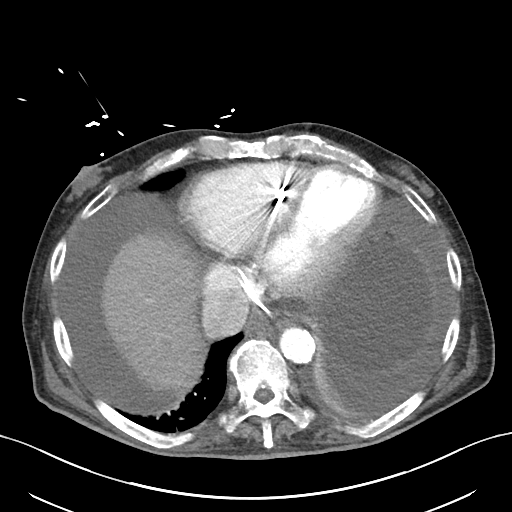
[im 106/115  lung]
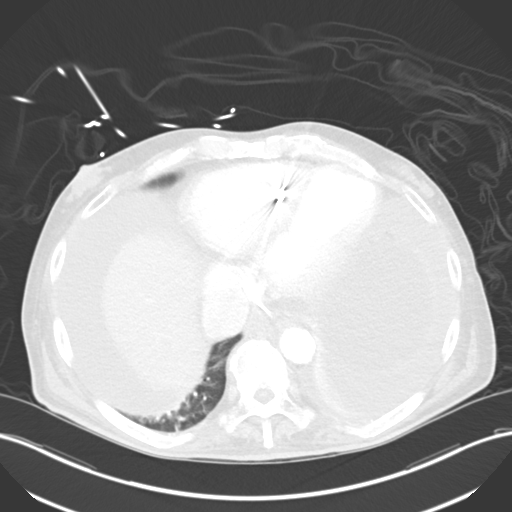
[im 106/115  bone]
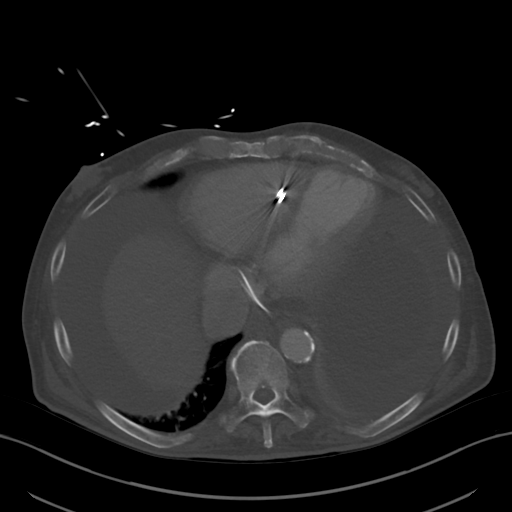

[Series 9: abdomen 3.0 mpr cor · coronal · 0.89mm/px · 3 of 97 slices shown]
[im 25/97  soft-tissue]
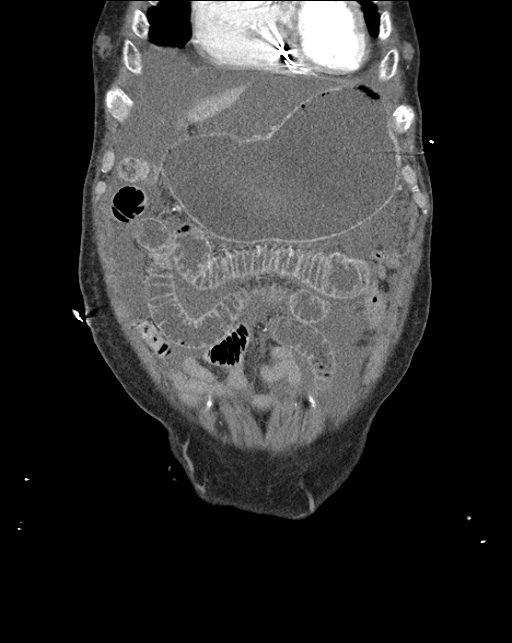
[im 49/97  soft-tissue]
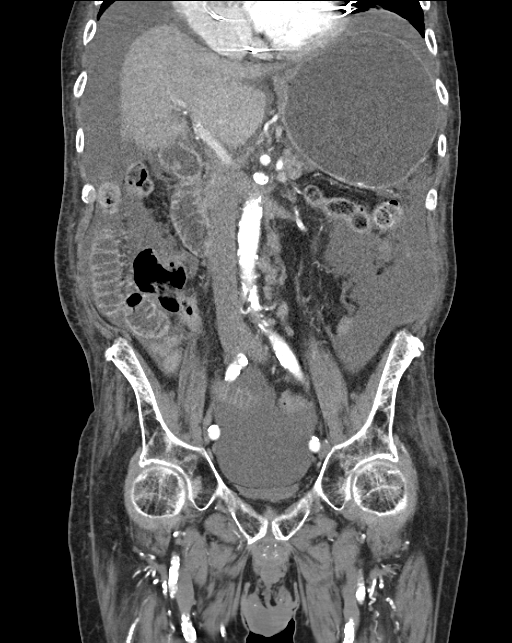
[im 73/97  soft-tissue]
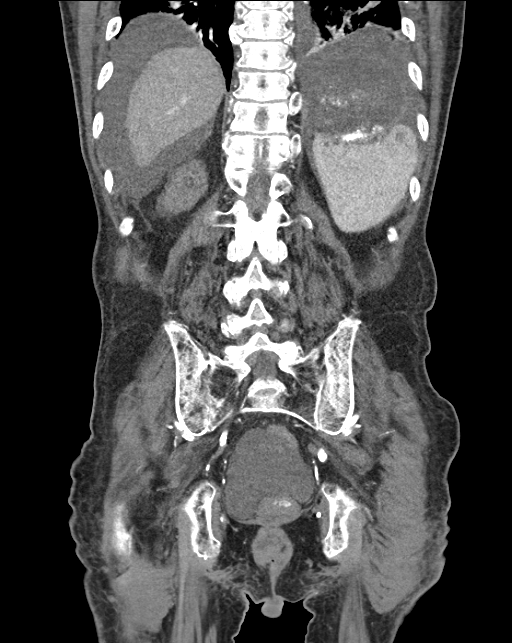

[13 of 46 positions shown; findings below may reference images not displayed]

FINDINGS: Lower chest: Small left pleural effusion. Bibasilar atelectasis or
consolidation. Cardiomegaly

Hepatobiliary: Shrunken and somewhat coarse appearing liver. Small
gallstones. No gallbladder wall thickening, or biliary dilatation.

Pancreas: Unremarkable. No pancreatic ductal dilatation or
surrounding inflammatory changes.

Spleen: Normal in size without focal abnormality.

Adrenals/Urinary Tract: Adrenal glands are unremarkable. Very
atrophic kidneys. Bladder is unremarkable.

Stomach/Bowel: The stomach is grossly fluid distended. The proximal
small bowel is mildly distended, largest loops measuring 3.9 cm.
There is a narrow necked umbilical hernia containing a single loop
of mid small bowel and fluid, with decompression of the distal small
bowel and colon.

Vascular/Lymphatic: Mixed calcific atherosclerosis. No enlarged
abdominal or pelvic lymph nodes.

Reproductive: Prostatomegaly.

Other: No abdominal wall hernia or abnormality. Large volume
ascites.

Musculoskeletal: Renal osteodystrophy.
IMPRESSION: 1. The stomach is grossly fluid distended. The proximal small bowel
is mildly distended, largest loops measuring 3.9 cm. There is a
narrow necked umbilical hernia containing a single loop of mid small
bowel and fluid, with decompression of the distal small bowel and
colon. Findings are concerning for obstruction at the level of an
incarcerated hernia.

2.  Hepatic morphology suggestive of cirrhosis with ascites.

3.  Other chronic and incidental findings as detailed above.

## 2021-10-04 IMAGING — DX DG CHEST 1V PORT
1 series · 1 of 1 positions shown · non-contrast
Comparison: 08/18/2019

CLINICAL DATA: Hypoxia, weakness, hypotension

EXAM:
PORTABLE CHEST 1 VIEW

[chest]
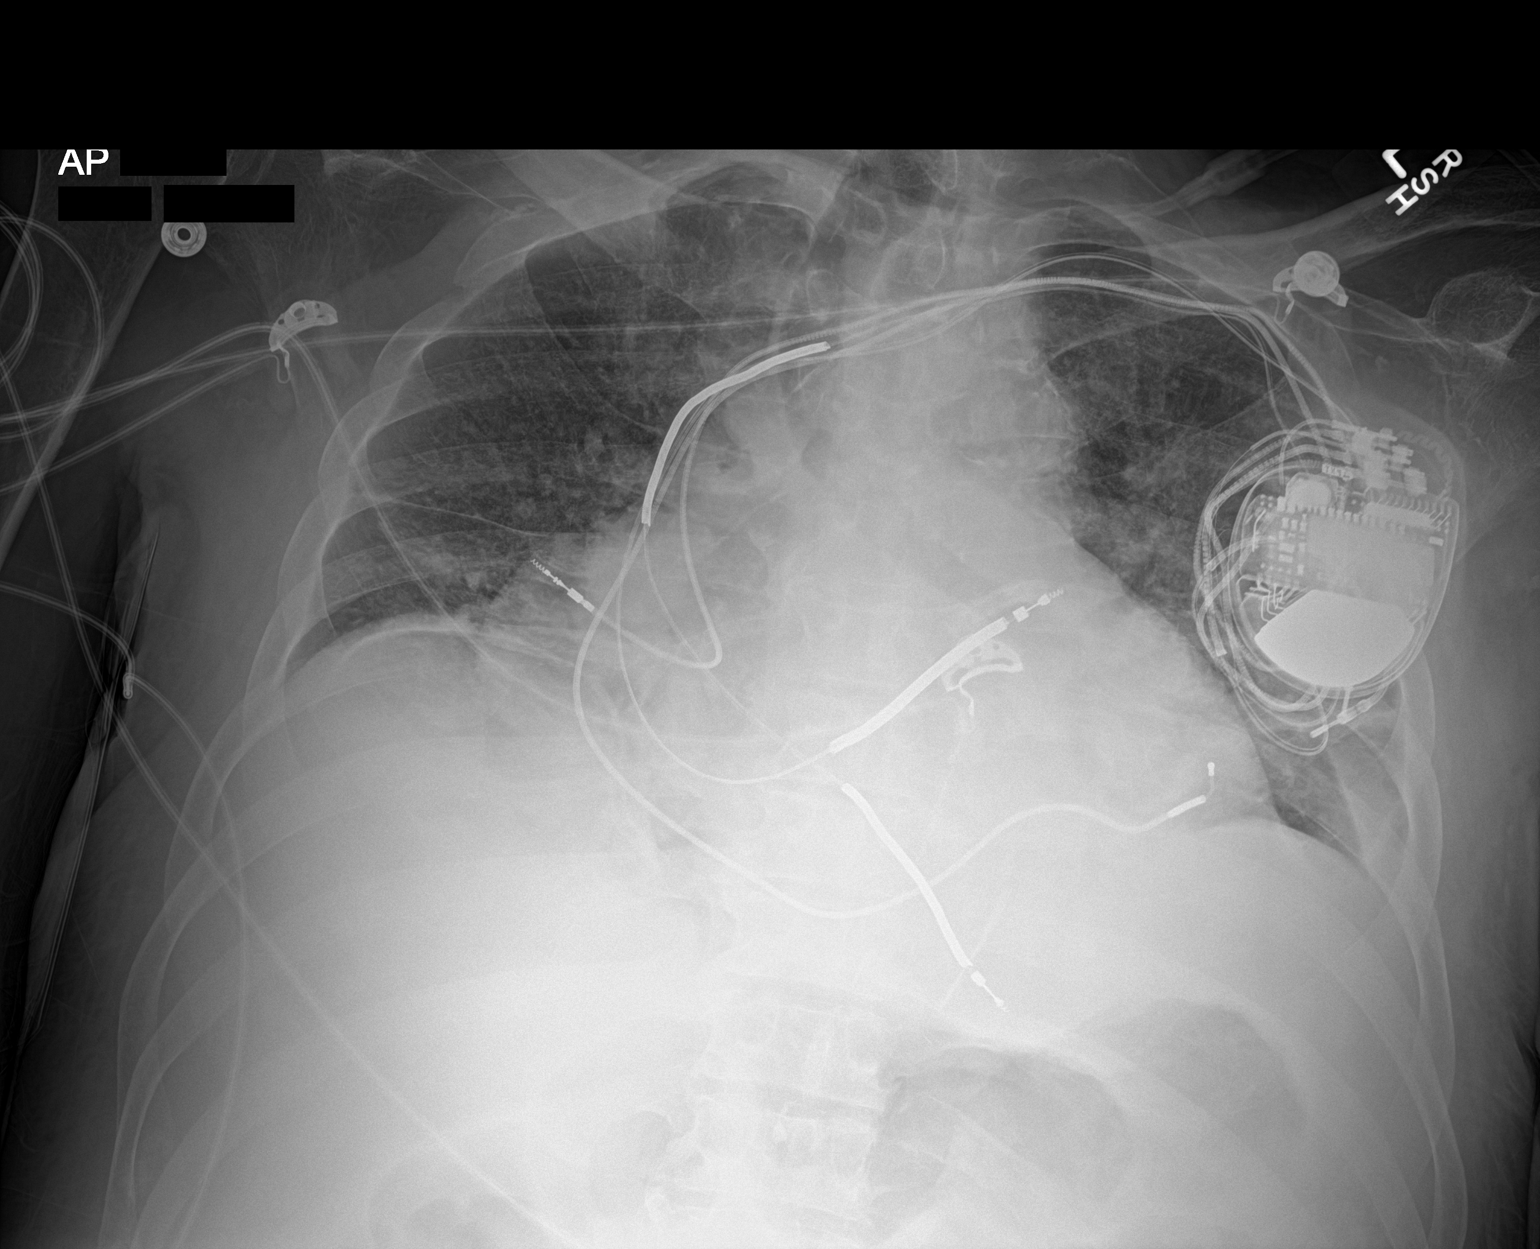

[1 of 1 positions shown; findings below may reference images not displayed]

FINDINGS: No significant interval change in low volume AP portable examination
with cardiomegaly, left chest multi lead pacer defibrillator, and
mild, diffuse interstitial pulmonary opacity, consistent with edema.
No new airspace opacity.
IMPRESSION: No significant interval change in low volume AP portable examination
with cardiomegaly and mild, diffuse interstitial pulmonary opacity,
consistent with edema. No new or focal airspace opacity.
